# Patient Record
Sex: Female | Born: 1940 | Race: White | Hispanic: No | Smoking: Never smoker
Health system: Southern US, Community
[De-identification: ages and names within clinical notes are randomized; demographics above are authoritative.]

## PROBLEM LIST (undated history)

## (undated) DIAGNOSIS — C50919 Malignant neoplasm of unspecified site of unspecified female breast: Secondary | ICD-10-CM

## (undated) DIAGNOSIS — D179 Benign lipomatous neoplasm, unspecified: Secondary | ICD-10-CM

## (undated) DIAGNOSIS — D751 Secondary polycythemia: Secondary | ICD-10-CM

## (undated) DIAGNOSIS — I10 Essential (primary) hypertension: Secondary | ICD-10-CM

## (undated) HISTORY — DX: Benign lipomatous neoplasm, unspecified: D17.9

## (undated) HISTORY — PX: OTHER SURGICAL HISTORY: SHX169

## (undated) HISTORY — DX: Secondary polycythemia: D75.1

---

## 1997-05-09 DIAGNOSIS — C50919 Malignant neoplasm of unspecified site of unspecified female breast: Secondary | ICD-10-CM

## 1997-05-09 HISTORY — PX: MASTECTOMY: SHX3

## 1997-05-09 HISTORY — DX: Malignant neoplasm of unspecified site of unspecified female breast: C50.919

## 2010-05-25 ENCOUNTER — Ambulatory Visit: Payer: Self-pay | Admitting: Internal Medicine

## 2011-05-31 ENCOUNTER — Ambulatory Visit: Payer: Self-pay | Admitting: Internal Medicine

## 2011-09-20 ENCOUNTER — Ambulatory Visit: Payer: Self-pay | Admitting: Internal Medicine

## 2011-11-01 ENCOUNTER — Inpatient Hospital Stay: Payer: Self-pay | Admitting: Internal Medicine

## 2011-11-01 LAB — TSH: Thyroid Stimulating Horm: 2.52 u[IU]/mL

## 2011-11-01 LAB — URINALYSIS, COMPLETE
Bacteria: NONE SEEN
Glucose,UR: NEGATIVE mg/dL (ref 0–75)
Leukocyte Esterase: NEGATIVE
Nitrite: NEGATIVE
Ph: 7 (ref 4.5–8.0)
Protein: NEGATIVE
Specific Gravity: 1.008 (ref 1.003–1.030)
WBC UR: 1 /HPF (ref 0–5)

## 2011-11-01 LAB — CBC
HGB: 16.6 g/dL — ABNORMAL HIGH (ref 12.0–16.0)
MCH: 31.6 pg (ref 26.0–34.0)
MCHC: 33.5 g/dL (ref 32.0–36.0)
Platelet: 207 10*3/uL (ref 150–440)
RBC: 5.23 10*6/uL — ABNORMAL HIGH (ref 3.80–5.20)
WBC: 6.2 10*3/uL (ref 3.6–11.0)

## 2011-11-01 LAB — COMPREHENSIVE METABOLIC PANEL
Anion Gap: 8 (ref 7–16)
Bilirubin,Total: 0.4 mg/dL (ref 0.2–1.0)
Chloride: 108 mmol/L — ABNORMAL HIGH (ref 98–107)
Creatinine: 0.91 mg/dL (ref 0.60–1.30)
Glucose: 108 mg/dL — ABNORMAL HIGH (ref 65–99)
Osmolality: 285 (ref 275–301)
SGOT(AST): 24 U/L (ref 15–37)
Total Protein: 7.5 g/dL (ref 6.4–8.2)

## 2011-11-01 LAB — TROPONIN I: Troponin-I: <0.02 ng/mL

## 2011-11-02 LAB — BASIC METABOLIC PANEL
Anion Gap: 10 (ref 7–16)
BUN: 29 mg/dL — ABNORMAL HIGH (ref 7–18)
Calcium, Total: 8.8 mg/dL (ref 8.5–10.1)
Chloride: 103 mmol/L (ref 98–107)
Co2: 24 mmol/L (ref 21–32)
Creatinine: 1.66 mg/dL — ABNORMAL HIGH (ref 0.60–1.30)
EGFR (African American): 36 — ABNORMAL LOW
Osmolality: 282 (ref 275–301)
Potassium: 3.8 mmol/L (ref 3.5–5.1)
Sodium: 137 mmol/L (ref 136–145)

## 2011-11-02 LAB — CBC WITH DIFFERENTIAL/PLATELET
Basophil %: 0.2 %
HCT: 46.7 % (ref 35.0–47.0)
HGB: 15.4 g/dL (ref 12.0–16.0)
Lymphocyte %: 7.6 %
MCH: 30.9 pg (ref 26.0–34.0)
Monocyte %: 6.2 %
Platelet: 192 10*3/uL (ref 150–440)
RDW: 12.7 % (ref 11.5–14.5)
WBC: 11.3 10*3/uL — ABNORMAL HIGH (ref 3.6–11.0)

## 2011-11-02 LAB — URINALYSIS, COMPLETE
Bilirubin,UR: NEGATIVE
Blood: NEGATIVE
Hyaline Cast: 4
Leukocyte Esterase: NEGATIVE
Ph: 7 (ref 4.5–8.0)
RBC,UR: 2 /HPF (ref 0–5)
WBC UR: 2 /HPF (ref 0–5)

## 2011-11-03 LAB — BASIC METABOLIC PANEL
Anion Gap: 8 (ref 7–16)
Calcium, Total: 8.7 mg/dL (ref 8.5–10.1)
Chloride: 108 mmol/L — ABNORMAL HIGH (ref 98–107)
Co2: 26 mmol/L (ref 21–32)
Glucose: 91 mg/dL (ref 65–99)
Sodium: 142 mmol/L (ref 136–145)

## 2011-11-03 LAB — CBC WITH DIFFERENTIAL/PLATELET
Eosinophil %: 0.9 %
HGB: 15.7 g/dL (ref 12.0–16.0)
Lymphocyte #: 2.5 10*3/uL (ref 1.0–3.6)
MCHC: 33.5 g/dL (ref 32.0–36.0)
MCV: 96 fL (ref 80–100)
Monocyte #: 0.9 x10 3/mm (ref 0.2–0.9)
Monocyte %: 10.1 %
Neutrophil #: 5.4 10*3/uL (ref 1.4–6.5)
Neutrophil %: 60.9 %
Platelet: 183 10*3/uL (ref 150–440)
RBC: 4.92 10*6/uL (ref 3.80–5.20)
WBC: 8.8 10*3/uL (ref 3.6–11.0)

## 2011-11-14 ENCOUNTER — Ambulatory Visit: Payer: Self-pay | Admitting: Internal Medicine

## 2012-06-05 ENCOUNTER — Ambulatory Visit: Payer: Self-pay | Admitting: Internal Medicine

## 2013-06-10 ENCOUNTER — Ambulatory Visit: Payer: Self-pay | Admitting: Internal Medicine

## 2013-08-22 ENCOUNTER — Ambulatory Visit: Payer: Self-pay | Admitting: Internal Medicine

## 2013-08-23 LAB — CBC CANCER CENTER
Basophil #: 0 x10 3/mm (ref 0.0–0.1)
Basophil %: 0.5 %
EOS ABS: 0 x10 3/mm (ref 0.0–0.7)
Eosinophil %: 0.3 %
HCT: 51.5 % — ABNORMAL HIGH (ref 35.0–47.0)
HGB: 16.7 g/dL — ABNORMAL HIGH (ref 12.0–16.0)
Lymphocyte #: 1.4 x10 3/mm (ref 1.0–3.6)
Lymphocyte %: 16.9 %
MCH: 30.7 pg (ref 26.0–34.0)
MCHC: 32.5 g/dL (ref 32.0–36.0)
MCV: 94 fL (ref 80–100)
MONOS PCT: 5.9 %
Monocyte #: 0.5 x10 3/mm (ref 0.2–0.9)
Neutrophil #: 6.4 x10 3/mm (ref 1.4–6.5)
Neutrophil %: 76.4 %
Platelet: 220 x10 3/mm (ref 150–440)
RBC: 5.46 10*6/uL — ABNORMAL HIGH (ref 3.80–5.20)
RDW: 13.2 % (ref 11.5–14.5)
WBC: 8.4 x10 3/mm (ref 3.6–11.0)

## 2013-08-23 LAB — FERRITIN: Ferritin (ARMC): 199 ng/mL (ref 8–388)

## 2013-08-23 LAB — IRON AND TIBC
IRON: 129 ug/dL (ref 50–170)
Iron Bind.Cap.(Total): 432 ug/dL (ref 250–450)
Iron Saturation: 30 %
Unbound Iron-Bind.Cap.: 303 ug/dL

## 2013-08-28 LAB — CANCER CENTER HEMATOCRIT: HCT: 44.5 % (ref 35.0–47.0)

## 2013-09-04 LAB — CANCER CENTER HEMATOCRIT: HCT: 43.9 % (ref 35.0–47.0)

## 2013-09-06 ENCOUNTER — Ambulatory Visit: Payer: Self-pay | Admitting: Internal Medicine

## 2013-09-18 LAB — CANCER CENTER HEMATOCRIT: HCT: 45.3 % (ref 35.0–47.0)

## 2013-10-02 LAB — CANCER CENTER HEMATOCRIT: HCT: 42.6 % (ref 35.0–47.0)

## 2013-10-07 ENCOUNTER — Ambulatory Visit: Payer: Self-pay | Admitting: Internal Medicine

## 2013-10-16 LAB — CANCER CENTER HEMATOCRIT: HCT: 45.2 % (ref 35.0–47.0)

## 2013-10-30 LAB — CANCER CENTER HEMATOCRIT: HCT: 43.6 % (ref 35.0–47.0)

## 2013-11-06 ENCOUNTER — Ambulatory Visit: Payer: Self-pay | Admitting: Internal Medicine

## 2013-11-13 LAB — CANCER CENTER HEMATOCRIT: HCT: 45.5 % (ref 35.0–47.0)

## 2013-11-27 DIAGNOSIS — D751 Secondary polycythemia: Secondary | ICD-10-CM | POA: Insufficient documentation

## 2013-11-27 LAB — CANCER CENTER HEMATOCRIT: HCT: 43.3 % (ref 35.0–47.0)

## 2013-12-07 ENCOUNTER — Ambulatory Visit: Payer: Self-pay | Admitting: Internal Medicine

## 2013-12-11 ENCOUNTER — Ambulatory Visit: Payer: Self-pay | Admitting: Internal Medicine

## 2013-12-11 LAB — COMPREHENSIVE METABOLIC PANEL
ALBUMIN: 3.3 g/dL — AB (ref 3.4–5.0)
AST: 14 U/L — AB (ref 15–37)
Alkaline Phosphatase: 48 U/L
Anion Gap: 8 (ref 7–16)
BILIRUBIN TOTAL: 0.3 mg/dL (ref 0.2–1.0)
BUN: 20 mg/dL — ABNORMAL HIGH (ref 7–18)
Calcium, Total: 8.6 mg/dL (ref 8.5–10.1)
Chloride: 107 mmol/L (ref 98–107)
Co2: 26 mmol/L (ref 21–32)
Creatinine: 1.08 mg/dL (ref 0.60–1.30)
EGFR (African American): 59 — ABNORMAL LOW
EGFR (Non-African Amer.): 51 — ABNORMAL LOW
GLUCOSE: 161 mg/dL — AB (ref 65–99)
Osmolality: 287 (ref 275–301)
POTASSIUM: 4.3 mmol/L (ref 3.5–5.1)
SGPT (ALT): 24 U/L
Sodium: 141 mmol/L (ref 136–145)
Total Protein: 6.5 g/dL (ref 6.4–8.2)

## 2013-12-11 LAB — CBC WITH DIFFERENTIAL/PLATELET
Basophil #: 0 10*3/uL (ref 0.0–0.1)
Basophil %: 0.8 %
Eosinophil #: 0.1 10*3/uL (ref 0.0–0.7)
Eosinophil %: 1.3 %
HCT: 40.8 % (ref 35.0–47.0)
HGB: 13.3 g/dL (ref 12.0–16.0)
LYMPHS PCT: 25.5 %
Lymphocyte #: 1.5 10*3/uL (ref 1.0–3.6)
MCH: 30.4 pg (ref 26.0–34.0)
MCHC: 32.5 g/dL (ref 32.0–36.0)
MCV: 94 fL (ref 80–100)
Monocyte #: 0.5 x10 3/mm (ref 0.2–0.9)
Monocyte %: 8.4 %
Neutrophil #: 3.9 10*3/uL (ref 1.4–6.5)
Neutrophil %: 64 %
PLATELETS: 223 10*3/uL (ref 150–440)
RBC: 4.36 10*6/uL (ref 3.80–5.20)
RDW: 12.5 % (ref 11.5–14.5)
WBC: 6.1 10*3/uL (ref 3.6–11.0)

## 2013-12-11 LAB — URINALYSIS, COMPLETE
BLOOD: NEGATIVE
Bilirubin,UR: NEGATIVE
GLUCOSE, UR: NEGATIVE mg/dL (ref 0–75)
Ketone: NEGATIVE
Leukocyte Esterase: NEGATIVE
NITRITE: NEGATIVE
PROTEIN: NEGATIVE
Ph: 6 (ref 4.5–8.0)
RBC,UR: NONE SEEN /HPF (ref 0–5)
Specific Gravity: 1.014 (ref 1.003–1.030)
Squamous Epithelial: <1

## 2013-12-11 LAB — LIPID PANEL
CHOLESTEROL: 181 mg/dL (ref 0–200)
HDL: 59 mg/dL (ref 40–60)
LDL CHOLESTEROL, CALC: 65 mg/dL (ref 0–100)
Triglycerides: 286 mg/dL — ABNORMAL HIGH (ref 0–200)
VLDL Cholesterol, Calc: 57 mg/dL — ABNORMAL HIGH (ref 5–40)

## 2013-12-11 LAB — CANCER CENTER HEMATOCRIT: HCT: 40.8 % (ref 35.0–47.0)

## 2013-12-13 LAB — URINE CULTURE

## 2013-12-25 LAB — CBC CANCER CENTER
BASOS ABS: 0.1 x10 3/mm (ref 0.0–0.1)
Basophil %: 0.8 %
EOS ABS: 0.1 x10 3/mm (ref 0.0–0.7)
Eosinophil %: 1.5 %
HCT: 46.4 % (ref 35.0–47.0)
HGB: 14.9 g/dL (ref 12.0–16.0)
Lymphocyte #: 1.7 x10 3/mm (ref 1.0–3.6)
Lymphocyte %: 26.8 %
MCH: 29.8 pg (ref 26.0–34.0)
MCHC: 32.1 g/dL (ref 32.0–36.0)
MCV: 93 fL (ref 80–100)
Monocyte #: 0.7 x10 3/mm (ref 0.2–0.9)
Monocyte %: 10.4 %
NEUTROS PCT: 60.5 %
Neutrophil #: 3.8 x10 3/mm (ref 1.4–6.5)
Platelet: 191 x10 3/mm (ref 150–440)
RBC: 5 10*6/uL (ref 3.80–5.20)
RDW: 12.5 % (ref 11.5–14.5)
WBC: 6.2 x10 3/mm (ref 3.6–11.0)

## 2014-01-07 ENCOUNTER — Ambulatory Visit: Payer: Self-pay | Admitting: Internal Medicine

## 2014-01-15 LAB — CANCER CENTER HEMATOCRIT: HCT: 46.4 % (ref 35.0–47.0)

## 2014-02-06 ENCOUNTER — Ambulatory Visit: Payer: Self-pay | Admitting: Internal Medicine

## 2014-02-12 LAB — CANCER CENTER HEMATOCRIT: HCT: 45 % (ref 35.0–47.0)

## 2014-03-09 ENCOUNTER — Ambulatory Visit: Payer: Self-pay | Admitting: Internal Medicine

## 2014-03-12 LAB — CANCER CENTER HEMATOCRIT: HCT: 42.8 % (ref 35.0–47.0)

## 2014-04-08 ENCOUNTER — Ambulatory Visit: Payer: Self-pay | Admitting: Internal Medicine

## 2014-04-09 LAB — CANCER CENTER HEMATOCRIT: HCT: 46.4 % (ref 35.0–47.0)

## 2014-05-07 LAB — CANCER CENTER HEMATOCRIT: HCT: 46.6 % (ref 35.0–47.0)

## 2014-05-09 ENCOUNTER — Ambulatory Visit: Payer: Self-pay | Admitting: Internal Medicine

## 2014-06-04 ENCOUNTER — Ambulatory Visit: Payer: Self-pay | Admitting: Internal Medicine

## 2014-06-04 LAB — CBC CANCER CENTER
BASOS ABS: 0.1 x10 3/mm (ref 0.0–0.1)
Basophil %: 0.9 %
EOS ABS: 0.1 x10 3/mm (ref 0.0–0.7)
Eosinophil %: 1.5 %
HCT: 44 % (ref 35.0–47.0)
HGB: 14.1 g/dL (ref 12.0–16.0)
Lymphocyte #: 1.4 x10 3/mm (ref 1.0–3.6)
Lymphocyte %: 22.4 %
MCH: 28.4 pg (ref 26.0–34.0)
MCHC: 32 g/dL (ref 32.0–36.0)
MCV: 89 fL (ref 80–100)
MONOS PCT: 12.2 %
Monocyte #: 0.7 x10 3/mm (ref 0.2–0.9)
NEUTROS PCT: 63 %
Neutrophil #: 3.8 x10 3/mm (ref 1.4–6.5)
Platelet: 187 x10 3/mm (ref 150–440)
RBC: 4.97 10*6/uL (ref 3.80–5.20)
RDW: 14.9 % — AB (ref 11.5–14.5)
WBC: 6.1 x10 3/mm (ref 3.6–11.0)

## 2014-06-04 LAB — COMPREHENSIVE METABOLIC PANEL
ALBUMIN: 3.4 g/dL (ref 3.4–5.0)
ALK PHOS: 52 U/L (ref 46–116)
Anion Gap: 8 (ref 7–16)
BILIRUBIN TOTAL: 0.3 mg/dL (ref 0.2–1.0)
BUN: 18 mg/dL (ref 7–18)
CALCIUM: 9 mg/dL (ref 8.5–10.1)
CREATININE: 0.92 mg/dL (ref 0.60–1.30)
Chloride: 108 mmol/L — ABNORMAL HIGH (ref 98–107)
Co2: 26 mmol/L (ref 21–32)
EGFR (African American): >60
EGFR (Non-African Amer.): >60
GLUCOSE: 99 mg/dL (ref 65–99)
Osmolality: 285 (ref 275–301)
Potassium: 4.1 mmol/L (ref 3.5–5.1)
SGOT(AST): 25 U/L (ref 15–37)
SGPT (ALT): 23 U/L (ref 14–63)
SODIUM: 142 mmol/L (ref 136–145)
Total Protein: 6.8 g/dL (ref 6.4–8.2)

## 2014-06-04 LAB — LIPID PANEL
Cholesterol: 180 mg/dL (ref 0–200)
HDL Cholesterol: 68 mg/dL — ABNORMAL HIGH (ref 40–60)
Ldl Cholesterol, Calc: 75 mg/dL (ref 0–100)
TRIGLYCERIDES: 187 mg/dL (ref 0–200)
VLDL Cholesterol, Calc: 37 mg/dL (ref 5–40)

## 2014-06-04 LAB — CBC WITH DIFFERENTIAL/PLATELET
BASOS PCT: 0.5 %
Basophil #: 0 10*3/uL (ref 0.0–0.1)
Eosinophil #: 0.1 10*3/uL (ref 0.0–0.7)
Eosinophil %: 1.3 %
HCT: 44.1 % (ref 35.0–47.0)
HGB: 14.2 g/dL (ref 12.0–16.0)
LYMPHS ABS: 1.4 10*3/uL (ref 1.0–3.6)
Lymphocyte %: 22.4 %
MCH: 28.6 pg (ref 26.0–34.0)
MCHC: 32.2 g/dL (ref 32.0–36.0)
MCV: 89 fL (ref 80–100)
MONO ABS: 0.7 x10 3/mm (ref 0.2–0.9)
Monocyte %: 12.3 %
Neutrophil #: 3.9 10*3/uL (ref 1.4–6.5)
Neutrophil %: 63.5 %
Platelet: 188 10*3/uL (ref 150–440)
RBC: 4.97 10*6/uL (ref 3.80–5.20)
RDW: 15.3 % — ABNORMAL HIGH (ref 11.5–14.5)
WBC: 6.1 10*3/uL (ref 3.6–11.0)

## 2014-06-04 LAB — URINALYSIS, COMPLETE
BACTERIA: NONE SEEN
Bilirubin,UR: NEGATIVE
Blood: NEGATIVE
Glucose,UR: NEGATIVE mg/dL (ref 0–75)
KETONE: NEGATIVE
LEUKOCYTE ESTERASE: NEGATIVE
Nitrite: NEGATIVE
PH: 7 (ref 4.5–8.0)
Protein: NEGATIVE
RBC,UR: 2 /HPF (ref 0–5)
Specific Gravity: 1.008 (ref 1.003–1.030)
WBC UR: <1 /HPF (ref 0–5)

## 2014-06-04 LAB — TSH: THYROID STIMULATING HORM: 0.875 u[IU]/mL

## 2014-06-09 ENCOUNTER — Ambulatory Visit: Payer: Self-pay | Admitting: Internal Medicine

## 2014-06-19 ENCOUNTER — Ambulatory Visit: Payer: Self-pay | Admitting: Internal Medicine

## 2014-07-08 ENCOUNTER — Ambulatory Visit
Admit: 2014-07-08 | Disposition: A | Discharge status: Home / Self Care | Payer: Self-pay | Attending: Internal Medicine | Admitting: Internal Medicine

## 2014-08-01 LAB — CANCER CENTER HEMATOCRIT: HCT: 44.3 % (ref 35.0–47.0)

## 2014-08-08 ENCOUNTER — Ambulatory Visit
Admit: 2014-08-08 | Disposition: A | Discharge status: Home / Self Care | Payer: Self-pay | Attending: Internal Medicine | Admitting: Internal Medicine

## 2014-08-27 LAB — CANCER CENTER HEMATOCRIT: HCT: 42.4 % (ref 35.0–47.0)

## 2014-08-31 NOTE — H&P (Signed)
PATIENT NAME:  Michele Andrade, Michele Andrade MR#:  [id number] DATE OF BIRTH:  1941-04-02  DATE OF ADMISSION:  11/01/2011  PRIMARY MD: Dr. Doy Hutching   ED REFERRING DOCTOR: Dr. Conni Slipper    CHIEF COMPLAINT: Confusion.   HISTORY OF PRESENT ILLNESS: The patient is a 74 year old Caucasian female who apparently has a history of hypertension, was on medication before but is not currently taking any medications. She lives in independent living at [facility], brought into the hospital after she was noted to be confused today. The patient was noted to have a blood pressure that was elevated in the 200's on arrival. The patient kept repeating herself asking the same questions. She has never had this happen before. She is currently a little bit better and knows she is in the hospital but keeps asking the same questions. She otherwise denies any chest pain, shortness of breath. No abdominal pain. Denies any asymmetrical weakness. No numbness. No previous history of CVA, TIA. She does have a lot of stressors ongoing currently.   PAST MEDICAL HISTORY: Remote history of hypertension, not on any treatment.   PAST SURGICAL HISTORY: Status post tonsillectomy.   ALLERGIES: None.   MEDICATIONS: None.   SOCIAL HISTORY: Does not smoke. Does not drink. No drugs.   FAMILY HISTORY: Positive for hypertension.  REVIEW OF SYSTEMS: Unobtainable due to the patient's confusion.   PHYSICAL EXAMINATION:   VITAL SIGNS: Temperature 98.1, pulse 68, respirations 18, blood pressure 177/92 currently.   GENERAL: The patient is a well developed, well nourished female in no acute distress.   HEENT: Head atraumatic, normocephalic. Pupils equally round, reactive to light and accommodation. There is no conjunctival pallor. Oropharynx is clear without any exudate.   NECK: There is no thyromegaly. No carotid bruits.   CARDIOVASCULAR: Regular rate and rhythm. No murmurs, rubs, clicks, or gallops. PMI is not displaced.   LUNGS: Clear  to auscultation bilaterally without any rales, rhonchi, or wheezing.   ABDOMEN: Soft, nontender, nondistended. Positive bowel sounds x4.   EXTREMITIES: No clubbing, cyanosis, or edema.   SKIN: No rash.   LYMPHATICS: No lymph nodes palpable.   VASCULAR: Good DP, PT pulses.   PSYCHIATRIC: Not anxious.   NEUROLOGIC: Cranial nerves II through XII grossly intact. Is alert to place, person, and time but intermittently gets confused. Strength 5/5 in all four extremities. Reflexes 2+. Babinski downgoing.   EVALUATIONS IN THE ED: Urinalysis shows nitrites negative, leukocytes negative. Glucose elevated at 130.   Chest x-ray shows borderline cardiomegaly.   CT scan of the head without contrast shows no acute abnormality.   WBC 6.2, hemoglobin 16.6, platelet count 207, glucose 108, BUN 27, creatinine 0.91, sodium 140, potassium 4.1, chloride 108, CO2 24. LFTs were normal. Troponin less than 0.02.   ASSESSMENT AND PLAN: The patient is a 74 year old white female with history of hypertension not on any treatment brought in with confusion, noted to have accelerated hypertension.  1. Acute encephalopathy likely due to accelerated blood pressure. CT scan of the head is negative at this time. Will go ahead and start her on p.o. atenolol and hydrochlorothiazide as well as clonidine. Will do p.r.n. hydralazine as needed. If the patient's symptoms continue to persist by morning, would get a neuro evaluation as well as MRI of the brain.  2. Mild hyperglycemia. Will check a fasting lipid panel.  3. Miscellaneous. Will hold any anticoagulation due to elevated blood pressure.   TIME SPENT: 35 minutes.    ____________________________ Lafonda Mosses Posey Pronto,  MD shp:drc D: 11/01/2011 22:12:43 ET T: 11/02/2011 08:00:26 ET JOB#: [id number]  cc: Klint Lezcano H. Posey Pronto, MD, <Dictator> Leonie Douglas. Doy Hutching, MD Alric Seton MD ELECTRONICALLY SIGNED 11/03/2011 16:39

## 2014-09-30 ENCOUNTER — Encounter: Payer: Self-pay | Admitting: *Deleted

## 2014-09-30 ENCOUNTER — Other Ambulatory Visit: Payer: Self-pay | Admitting: *Deleted

## 2014-09-30 DIAGNOSIS — D751 Secondary polycythemia: Secondary | ICD-10-CM

## 2014-09-30 HISTORY — DX: Secondary polycythemia: D75.1

## 2014-10-01 ENCOUNTER — Inpatient Hospital Stay: Payer: Medicare Other | Attending: Oncology

## 2014-10-01 ENCOUNTER — Inpatient Hospital Stay: Payer: Medicare Other

## 2014-10-01 VITALS — BP 160/84 | HR 55 | Temp 97.0°F | Resp 18

## 2014-10-01 DIAGNOSIS — D751 Secondary polycythemia: Secondary | ICD-10-CM

## 2014-10-01 LAB — HEMATOCRIT: HCT: 43.7 % (ref 35.0–47.0)

## 2014-10-28 ENCOUNTER — Other Ambulatory Visit: Payer: Self-pay

## 2014-10-29 ENCOUNTER — Inpatient Hospital Stay: Payer: Medicare Other

## 2014-10-29 ENCOUNTER — Inpatient Hospital Stay: Payer: Medicare Other | Attending: Oncology

## 2014-10-29 VITALS — BP 130/81 | HR 61

## 2014-10-29 DIAGNOSIS — D751 Secondary polycythemia: Secondary | ICD-10-CM | POA: Diagnosis not present

## 2014-10-29 LAB — HEMATOCRIT: HEMATOCRIT: 45 % (ref 35.0–47.0)

## 2014-11-26 ENCOUNTER — Inpatient Hospital Stay: Payer: Medicare Other

## 2014-11-26 ENCOUNTER — Inpatient Hospital Stay: Payer: Medicare Other | Attending: Oncology

## 2014-11-26 DIAGNOSIS — D751 Secondary polycythemia: Secondary | ICD-10-CM | POA: Insufficient documentation

## 2014-11-26 LAB — HEMATOCRIT: HEMATOCRIT: 45.1 % (ref 35.0–47.0)

## 2014-12-17 ENCOUNTER — Inpatient Hospital Stay: Payer: Medicare Other

## 2014-12-17 ENCOUNTER — Inpatient Hospital Stay (HOSPITAL_BASED_OUTPATIENT_CLINIC_OR_DEPARTMENT_OTHER): Payer: Medicare Other | Admitting: Internal Medicine

## 2014-12-17 ENCOUNTER — Inpatient Hospital Stay: Payer: Medicare Other | Attending: Internal Medicine

## 2014-12-17 VITALS — BP 148/86 | HR 59 | Temp 97.9°F | Resp 16 | Wt 140.4 lb

## 2014-12-17 DIAGNOSIS — Z79899 Other long term (current) drug therapy: Secondary | ICD-10-CM

## 2014-12-17 DIAGNOSIS — M8580 Other specified disorders of bone density and structure, unspecified site: Secondary | ICD-10-CM | POA: Insufficient documentation

## 2014-12-17 DIAGNOSIS — G43909 Migraine, unspecified, not intractable, without status migrainosus: Secondary | ICD-10-CM | POA: Insufficient documentation

## 2014-12-17 DIAGNOSIS — Z7982 Long term (current) use of aspirin: Secondary | ICD-10-CM | POA: Insufficient documentation

## 2014-12-17 DIAGNOSIS — E785 Hyperlipidemia, unspecified: Secondary | ICD-10-CM | POA: Diagnosis not present

## 2014-12-17 DIAGNOSIS — Z853 Personal history of malignant neoplasm of breast: Secondary | ICD-10-CM

## 2014-12-17 DIAGNOSIS — C50919 Malignant neoplasm of unspecified site of unspecified female breast: Secondary | ICD-10-CM | POA: Insufficient documentation

## 2014-12-17 DIAGNOSIS — D751 Secondary polycythemia: Secondary | ICD-10-CM

## 2014-12-17 DIAGNOSIS — I10 Essential (primary) hypertension: Secondary | ICD-10-CM | POA: Insufficient documentation

## 2014-12-17 DIAGNOSIS — K635 Polyp of colon: Secondary | ICD-10-CM | POA: Insufficient documentation

## 2014-12-17 LAB — CBC WITH DIFFERENTIAL/PLATELET
BASOS ABS: 0 10*3/uL (ref 0–0.1)
Basophils Relative: 1 %
EOS ABS: 0 10*3/uL (ref 0–0.7)
EOS PCT: 1 %
HCT: 43.3 % (ref 35.0–47.0)
Hemoglobin: 14.2 g/dL (ref 12.0–16.0)
LYMPHS PCT: 25 %
Lymphs Abs: 1.8 10*3/uL (ref 1.0–3.6)
MCH: 28.5 pg (ref 26.0–34.0)
MCHC: 32.8 g/dL (ref 32.0–36.0)
MCV: 86.9 fL (ref 80.0–100.0)
Monocytes Absolute: 0.8 10*3/uL (ref 0.2–0.9)
Monocytes Relative: 11 %
Neutro Abs: 4.5 10*3/uL (ref 1.4–6.5)
Neutrophils Relative %: 62 %
PLATELETS: 195 10*3/uL (ref 150–440)
RBC: 4.98 MIL/uL (ref 3.80–5.20)
RDW: 14.9 % — AB (ref 11.5–14.5)
WBC: 7.1 10*3/uL (ref 3.6–11.0)

## 2014-12-29 NOTE — Progress Notes (Signed)
[place]  [phone] [phone]     ID: Michele Andrade OB: 1940/07/12  MR#: [id number]  LGX#:[id number]  Patient Care Team: Idelle Crouch, MD as PCP - General (Internal Medicine)  CHIEF COMPLAINT/DIAGNOSIS:  Erythrocytosis, nonsmoker  -  possibly myeloproliferative disorder like polycythemia vera versus other unclear etiology. Workup done on 08/23/13 showed hemoglobin 16.7, hematocrit 51.5%, WBC 8400, platelets 220, ferritin 199, serum iron 129, iron saturation 30%, serum EPO 5.9, COHb 1.4%, [id number] mutation negative. Started phlebotomy therapy on 08/23/13.    (prior labs on 08/19/13 showed Hb 17.6, hematocrit 52.1%, WBC 6800, unremarkable differential, platelets 193K. On 06/14/13, Hb was 16.5, Hct 49.8, WBC 5600, platelets 198, ferritin 148, LFT unremarkable, Cr 1.0. In July 2014, Hb 16.5, Hct 49.5, ANA negative, ESR 4. In Jan 2014 Hb 15.9, Hct 46.9)      HISTORY OF PRESENT ILLNESS:  Patient returns for continued hematology followup, she was seen 6-7 months ago. In between hematocrit monitored has fluctuated in the range of 42.4-45.1, it is 43.3 today. States that she is doing steady and remains physically active. Denies any new dyspnea, orthopnea, PND. States that she maintains adequate fluid intake. She never smoked herself, has intermittent history of secondhand smoke exposure from her husband smoking cigars up until 2011 but none since then. No headaches or other neurological symptoms. Denies any known history of thromboembolic phenomenon including DVT, PE, TIA, CVA, or heart attack. No new bone pains. No fevers.    REVIEW OF SYSTEMS:   ROS As in HPI above. In addition, no fevers or sweats. No new headaches or focal weakness.  No new dizziness or palpitation. No abdominal pain, constipation, diarrhea, dysuria or hematuria. No new skin rash or bleeding symptoms. No new paresthesias in extremities. No polyuria or polydipsia.   PAST MEDICAL  HISTORY: Reviewed. Past Medical History  Diagnosis Date  . Erythrocytosis 09/30/2014          Hypertension  Hyperlipidemia  Colon polyps  Osteopenia  Visual migraines  Breast cancer status post mastectomy and adjuvant chemotherapy 1999  Right rotator cuff surgery  PAST SURGICAL HISTORY: Reviewed. As above.  FAMILY HISTORY: Reviewed. Remarkable for hypertension. Her mother had breast cancer. Denies hematological disorders including polycythemia vera.  SOCIAL HISTORY: Reviewed. Never smoked herself, has intermittent history of secondhand smoke exposure (husband smoked cigars from 1967 - 2011). Denies alcohol or recreational drug usage.   Current Outpatient Prescriptions  Medication Sig Dispense Refill  . aspirin EC 81 MG tablet Take by mouth.    . Calcium Carbonate-Vitamin D 600-400 MG-UNIT per tablet Take by mouth.    . Coenzyme Q10 (CO Q-10) 100 MG CAPS Take by mouth.    Marland Kitchen lisinopril (PRINIVIL,ZESTRIL) 10 MG tablet Take by mouth.    Marland Kitchen MAGNESIUM GLYCINATE PLUS PO Take by mouth.    . Multiple Vitamin (MULTI-VITAMINS) TABS Take by mouth.    . Omega-3 Fatty Acids (FISH OIL) 1000 MG CAPS Take by mouth.    . propranolol (INDERAL) 80 MG tablet Take by mouth.    . riboflavin (VITAMIN B-2) 100 MG TABS tablet Take 2 tablets by mouth daily.     No current facility-administered medications for this visit.    PHYSICAL EXAM: Filed Vitals:   12/17/14 1419  BP: 148/86  Pulse: 59  Temp: 97.9 F (36.6 C)  Resp: 16     There is no height on file to calculate BMI.      GENERAL: Patient is alert and oriented  and in no acute distress. There is no icterus. HEENT: EOMs intact. No cervical lymphadenopathy. CVS: S1S2, regular LUNGS: Bilaterally clear to auscultation, no rhonchi. ABDOMEN: Soft, nontender. No hepatosplenomegaly clinically.  EXTREMITIES: No pedal edema.   LAB RESULTS:    Component Value Date/Time   NA 142 06/04/2014 1443   K 4.1 06/04/2014 1443   CL 108* 06/04/2014 1443     CO2 26 06/04/2014 1443   GLUCOSE 99 06/04/2014 1443   BUN 18 06/04/2014 1443   CREATININE 0.92 06/04/2014 1443   CALCIUM 9.0 06/04/2014 1443   PROT 6.8 06/04/2014 1443   ALBUMIN 3.4 06/04/2014 1443   AST 25 06/04/2014 1443   ALT 23 06/04/2014 1443   ALKPHOS 52 06/04/2014 1443   BILITOT 0.3 06/04/2014 1443   GFRNONAA >60 06/04/2014 1443   GFRNONAA 51* 12/11/2013 1412   GFRAA >60 06/04/2014 1443   GFRAA 59* 12/11/2013 1412    Lab Results  Component Value Date   WBC 7.1 12/17/2014   NEUTROABS 4.5 12/17/2014   HGB 14.2 12/17/2014   HCT 43.3 12/17/2014   MCV 86.9 12/17/2014   PLT 195 12/17/2014    STUDIES: 08/19/13 - hemoglobin 17.6, hematocrit 52.1%, WBC 6800, unremarkable differential, platelets 193K.  06/14/13 - hemoglobin was 16.5, hematocrit 49.8, WBC 5600, platelets 198, ferritin 148, LFT unremarkable, creatinine 1.0. July 2014 - hemoglobin 16.5, hematocrit 49.5, ANA negative, ESR 4.  Jan 2014 - hemoglobin 15.9, hematocrit 46.9.    ASSESSMENT / PLAN:   1. Persistent Erythrocytosis, nonsmoker. Possibly myeloproliferative disorder like polycythemia vera versus other unclear etiology. Workup done on 08/23/13 showed Hb 16.7, hematocrit 51.5%, WBC 8400, platelets 220, ferritin 199, serum iron 129, iron saturation 30%, serum EPO 5.9, COHb 1.4%, [id number] mutation negative. Started phlebotomy therapy on 08/23/13  -   reviewed labs from today and recent, and d/w patient in detail. She overall continues to do steady without any active complaints, no history of thromboembolic phenomena either. Hematocrit remains under better control, it is 43.3 today, will pursue phlebotomy 300 mL. She was reminded to maintain adequate oral fluid intake. Will continue to monitor Hct at less than frequency of q6 weekly and pursue phlebotomy 300 mL each time is Hct is 43 or higher. Next MD followup at 36 weeks with CBC and make further plan of management. 2. Patient is on aspirin 81 mg daily.  3. In  between visits, she was advised to call or come to ER in case of any new side effects from phlebotomy, new symptoms, or acute sickness. She is agreeable to this plan.        Leia Alf, MD   12/29/2014 1:58 PM

## 2015-01-28 ENCOUNTER — Inpatient Hospital Stay: Payer: Medicare Other | Attending: Internal Medicine

## 2015-01-28 ENCOUNTER — Inpatient Hospital Stay: Payer: Medicare Other

## 2015-01-28 VITALS — BP 134/84 | HR 60 | Resp 20

## 2015-01-28 DIAGNOSIS — D751 Secondary polycythemia: Secondary | ICD-10-CM | POA: Diagnosis not present

## 2015-01-28 DIAGNOSIS — Z853 Personal history of malignant neoplasm of breast: Secondary | ICD-10-CM | POA: Diagnosis not present

## 2015-01-28 LAB — HEMATOCRIT: HEMATOCRIT: 45.5 % (ref 35.0–47.0)

## 2015-03-11 ENCOUNTER — Other Ambulatory Visit: Payer: Medicare Other

## 2015-03-12 ENCOUNTER — Inpatient Hospital Stay: Payer: Medicare Other

## 2015-03-12 ENCOUNTER — Inpatient Hospital Stay: Payer: Medicare Other | Attending: Internal Medicine

## 2015-03-12 VITALS — BP 147/83 | HR 56 | Resp 20

## 2015-03-12 DIAGNOSIS — D751 Secondary polycythemia: Secondary | ICD-10-CM | POA: Insufficient documentation

## 2015-03-12 LAB — HEMATOCRIT: HEMATOCRIT: 47.8 % — AB (ref 35.0–47.0)

## 2015-04-22 ENCOUNTER — Inpatient Hospital Stay: Payer: Medicare Other | Attending: Internal Medicine

## 2015-04-22 ENCOUNTER — Inpatient Hospital Stay: Payer: Medicare Other

## 2015-04-22 VITALS — BP 150/88 | HR 80 | Resp 20

## 2015-04-22 DIAGNOSIS — D751 Secondary polycythemia: Secondary | ICD-10-CM

## 2015-04-22 LAB — HEMATOCRIT: HCT: 46.5 % (ref 35.0–47.0)

## 2015-06-03 ENCOUNTER — Inpatient Hospital Stay: Payer: Medicare Other | Attending: Internal Medicine

## 2015-06-03 ENCOUNTER — Inpatient Hospital Stay: Payer: Medicare Other

## 2015-06-03 VITALS — BP 123/78 | HR 56 | Temp 97.0°F | Resp 20

## 2015-06-03 DIAGNOSIS — D751 Secondary polycythemia: Secondary | ICD-10-CM

## 2015-06-03 LAB — HEMATOCRIT: HCT: 46.8 % (ref 35.0–47.0)

## 2015-06-11 ENCOUNTER — Other Ambulatory Visit: Payer: Self-pay | Admitting: Internal Medicine

## 2015-06-11 DIAGNOSIS — Z1231 Encounter for screening mammogram for malignant neoplasm of breast: Secondary | ICD-10-CM

## 2015-06-25 ENCOUNTER — Other Ambulatory Visit: Payer: Self-pay | Admitting: Internal Medicine

## 2015-06-25 ENCOUNTER — Ambulatory Visit
Admission: RE | Admit: 2015-06-25 | Discharge: 2015-06-25 | Disposition: A | Discharge status: Home / Self Care | Payer: Medicare Other | Source: Ambulatory Visit | Attending: Internal Medicine | Admitting: Internal Medicine

## 2015-06-25 DIAGNOSIS — Z1231 Encounter for screening mammogram for malignant neoplasm of breast: Secondary | ICD-10-CM | POA: Diagnosis not present

## 2015-06-25 DIAGNOSIS — Z9011 Acquired absence of right breast and nipple: Secondary | ICD-10-CM | POA: Insufficient documentation

## 2015-06-25 HISTORY — DX: Malignant neoplasm of unspecified site of unspecified female breast: C50.919

## 2015-07-15 ENCOUNTER — Inpatient Hospital Stay: Payer: Medicare Other | Attending: Internal Medicine

## 2015-07-15 ENCOUNTER — Inpatient Hospital Stay: Payer: Medicare Other

## 2015-07-15 ENCOUNTER — Other Ambulatory Visit: Payer: Self-pay | Admitting: *Deleted

## 2015-07-15 VITALS — BP 124/76 | HR 58 | Temp 98.8°F | Resp 20

## 2015-07-15 DIAGNOSIS — D751 Secondary polycythemia: Secondary | ICD-10-CM | POA: Diagnosis present

## 2015-07-15 LAB — HEMATOCRIT: HEMATOCRIT: 45.3 % (ref 35.0–47.0)

## 2015-08-26 ENCOUNTER — Ambulatory Visit: Payer: Medicare Other | Admitting: Internal Medicine

## 2015-08-26 ENCOUNTER — Other Ambulatory Visit: Payer: Medicare Other

## 2015-08-27 ENCOUNTER — Inpatient Hospital Stay: Payer: Medicare Other

## 2015-08-27 ENCOUNTER — Other Ambulatory Visit: Payer: Self-pay | Admitting: *Deleted

## 2015-08-27 ENCOUNTER — Inpatient Hospital Stay: Payer: Medicare Other | Attending: Internal Medicine | Admitting: Internal Medicine

## 2015-08-27 VITALS — BP 154/80 | HR 50

## 2015-08-27 VITALS — BP 149/83 | HR 59 | Temp 97.5°F | Resp 16 | Wt 142.6 lb

## 2015-08-27 DIAGNOSIS — Z853 Personal history of malignant neoplasm of breast: Secondary | ICD-10-CM | POA: Insufficient documentation

## 2015-08-27 DIAGNOSIS — Z9011 Acquired absence of right breast and nipple: Secondary | ICD-10-CM | POA: Diagnosis not present

## 2015-08-27 DIAGNOSIS — D751 Secondary polycythemia: Secondary | ICD-10-CM

## 2015-08-27 DIAGNOSIS — Z7982 Long term (current) use of aspirin: Secondary | ICD-10-CM | POA: Diagnosis not present

## 2015-08-27 DIAGNOSIS — Z79899 Other long term (current) drug therapy: Secondary | ICD-10-CM | POA: Diagnosis not present

## 2015-08-27 LAB — CBC WITH DIFFERENTIAL/PLATELET
BASOS ABS: 0 10*3/uL (ref 0–0.1)
Basophils Relative: 1 %
Eosinophils Absolute: 0.1 10*3/uL (ref 0–0.7)
Eosinophils Relative: 1 %
HEMATOCRIT: 47.4 % — AB (ref 35.0–47.0)
HEMOGLOBIN: 15.9 g/dL (ref 12.0–16.0)
Lymphocytes Relative: 21 %
Lymphs Abs: 1.3 10*3/uL (ref 1.0–3.6)
MCH: 30.2 pg (ref 26.0–34.0)
MCHC: 33.4 g/dL (ref 32.0–36.0)
MCV: 90.4 fL (ref 80.0–100.0)
MONOS PCT: 7 %
Monocytes Absolute: 0.4 10*3/uL (ref 0.2–0.9)
NEUTROS ABS: 4.4 10*3/uL (ref 1.4–6.5)
NEUTROS PCT: 70 %
Platelets: 184 10*3/uL (ref 150–440)
RBC: 5.25 MIL/uL — AB (ref 3.80–5.20)
RDW: 14.3 % (ref 11.5–14.5)
WBC: 6.2 10*3/uL (ref 3.6–11.0)

## 2015-08-27 LAB — FERRITIN: FERRITIN: 9 ng/mL — AB (ref 11–307)

## 2015-08-27 LAB — IRON AND TIBC
IRON: 141 ug/dL (ref 28–170)
Saturation Ratios: 27 % (ref 10.4–31.8)
TIBC: 515 ug/dL — AB (ref 250–450)
UIBC: 374 ug/dL

## 2015-08-27 NOTE — Progress Notes (Signed)
[place] OFFICE PROGRESS NOTE  Patient Care Team: Idelle Crouch, MD as PCP - General (Internal Medicine)   SUMMARY OF ONCOLOGIC HISTORY:  # April 2015- ERTHROCYTOSIS ? Etiology on Phlebotomy every 6 weeks  # 1999-RIGHT BREAST- Mastec & ALND [[place]] & chemo; Tam x 5 years   INTERVAL HISTORY:  This is my first interaction with the patient since I joined the practice September 2016. I reviewed the patient's prior charts/pertinent labs/imaging in detail; findings are summarized above.   A very pleasant 75 year old female patient with above history of erythrocytosis since  April 2015 is here for follow-up/ Phlebotomy.   Feel any different either before or after phlebotomy. She has no history of any headaches or DVTs or PEs.  She takes aspirin.   REVIEW OF SYSTEMS:  A complete 10 point review of system is done which is negative except mentioned above/history of present illness.   PAST MEDICAL HISTORY :  Past Medical History  Diagnosis Date  . Erythrocytosis 09/30/2014  . Breast cancer ([place]) 1999    right breast ca    PAST SURGICAL HISTORY :   Past Surgical History  Procedure Laterality Date  . Mastectomy Right 1999    FAMILY HISTORY :   Family History  Problem Relation Age of Onset  . Breast cancer Mother 71    SOCIAL HISTORY:   Social History  Substance Use Topics  . Smoking status: Not on file  . Smokeless tobacco: Not on file  . Alcohol Use: Not on file    ALLERGIES:  has No Known Allergies.  MEDICATIONS:  Current Outpatient Prescriptions  Medication Sig Dispense Refill  . aspirin 325 MG tablet Take 325 mg by mouth daily.    . Calcium Carbonate-Vitamin D 600-400 MG-UNIT per tablet Take by mouth.    . Coenzyme Q10 (CO Q-10) 100 MG CAPS Take by mouth.    Marland Kitchen lisinopril (PRINIVIL,ZESTRIL) 10 MG tablet Take by mouth.    Marland Kitchen MAGNESIUM GLYCINATE PLUS PO Take by mouth.    . Multiple Vitamin (MULTI-VITAMINS) TABS Take by mouth.    . Omega-3  Fatty Acids (FISH OIL) 1000 MG CAPS Take by mouth.    . propranolol (INDERAL) 80 MG tablet Take by mouth.    . riboflavin (VITAMIN B-2) 100 MG TABS tablet Take 2 tablets by mouth daily.     No current facility-administered medications for this visit.    PHYSICAL EXAMINATION: ECOG PERFORMANCE STATUS: 0 - Asymptomatic  BP 149/83 mmHg  Pulse 59  Temp(Src) 97.5 F (36.4 C) (Tympanic)  Resp 16  Wt 142 lb 10.2 oz (64.7 kg)  Filed Weights   08/27/15 1414  Weight: 142 lb 10.2 oz (64.7 kg)    GENERAL: Well-nourished well-developed; Alert, no distress and comfortable.  Alone.  EYES: no pallor or icterus OROPHARYNX: no thrush or ulceration; good dentition  NECK: supple, no masses felt LYMPH:  no palpable lymphadenopathy in the cervical, axillary or inguinal regions LUNGS: clear to auscultation and  No wheeze or crackles HEART/CVS: regular rate & rhythm and no murmurs; No lower extremity edema ABDOMEN:abdomen soft, non-tender and normal bowel sounds Musculoskeletal:no cyanosis of digits and no clubbing  PSYCH: alert & oriented x 3 with fluent speech NEURO: no focal motor/sensory deficits SKIN:  no rashes or significant lesions  LABORATORY DATA:  I have reviewed the data as listed    Component Value Date/Time   NA 142 06/04/2014 1443   K 4.1 06/04/2014 1443   CL 108* 06/04/2014  1443   CO2 26 06/04/2014 1443   GLUCOSE 99 06/04/2014 1443   BUN 18 06/04/2014 1443   CREATININE 0.92 06/04/2014 1443   CALCIUM 9.0 06/04/2014 1443   PROT 6.8 06/04/2014 1443   ALBUMIN 3.4 06/04/2014 1443   AST 25 06/04/2014 1443   ALT 23 06/04/2014 1443   ALKPHOS 52 06/04/2014 1443   BILITOT 0.3 06/04/2014 1443   GFRNONAA >60 06/04/2014 1443   GFRNONAA 51* 12/11/2013 1412   GFRAA >60 06/04/2014 1443   GFRAA 59* 12/11/2013 1412    No results found for: SPEP, UPEP  Lab Results  Component Value Date   WBC 6.2 08/27/2015   NEUTROABS 4.4 08/27/2015   HGB 15.9 08/27/2015   HCT 47.4* 08/27/2015    MCV 90.4 08/27/2015   PLT 184 08/27/2015      Chemistry      Component Value Date/Time   NA 142 06/04/2014 1443   K 4.1 06/04/2014 1443   CL 108* 06/04/2014 1443   CO2 26 06/04/2014 1443   BUN 18 06/04/2014 1443   CREATININE 0.92 06/04/2014 1443      Component Value Date/Time   CALCIUM 9.0 06/04/2014 1443   ALKPHOS 52 06/04/2014 1443   AST 25 06/04/2014 1443   ALT 23 06/04/2014 1443   BILITOT 0.3 06/04/2014 1443          ASSESSMENT & PLAN:   # ERYTHROCYTOSIS-  Unclear etiology. Check  Barnabas Lister 2 mutation reflex;  Erythropoietin level. Patient's hemoglobin today is 15.9 hematocrit 47.  Continue phlebotomy to keep the hematocrit  Less than 45.   Discussed with the patient the difference between primary and secondary erythrocytosis.  Above workup  Might help differentiate both.  #  Patient will follow-up with me in approximately 6 weeks/ possible phlebotomy/  H&H;  And review the results of the above workup.     Cammie Sickle, MD 08/27/2015 2:22 PM

## 2015-08-27 NOTE — Progress Notes (Signed)
Patient does not offer any problems today.  

## 2015-09-07 LAB — JAK2  V617F QUAL. WITH REFLEX TO EXON 12

## 2015-09-07 LAB — JAK2 EXON 12 MUTATION ANALYSIS

## 2015-09-07 LAB — ERYTHROPOIETIN: Erythropoietin: 18.4 m[IU]/mL (ref 2.6–18.5)

## 2015-10-08 ENCOUNTER — Inpatient Hospital Stay (HOSPITAL_BASED_OUTPATIENT_CLINIC_OR_DEPARTMENT_OTHER): Payer: Medicare Other | Admitting: Internal Medicine

## 2015-10-08 ENCOUNTER — Inpatient Hospital Stay: Payer: Medicare Other | Attending: Internal Medicine

## 2015-10-08 ENCOUNTER — Inpatient Hospital Stay: Payer: Medicare Other

## 2015-10-08 ENCOUNTER — Encounter: Payer: Self-pay | Admitting: Internal Medicine

## 2015-10-08 VITALS — BP 155/76 | HR 70 | Temp 98.1°F | Resp 20 | Ht 62.0 in | Wt 143.3 lb

## 2015-10-08 DIAGNOSIS — Z79899 Other long term (current) drug therapy: Secondary | ICD-10-CM | POA: Insufficient documentation

## 2015-10-08 DIAGNOSIS — Z9011 Acquired absence of right breast and nipple: Secondary | ICD-10-CM | POA: Insufficient documentation

## 2015-10-08 DIAGNOSIS — Z853 Personal history of malignant neoplasm of breast: Secondary | ICD-10-CM | POA: Insufficient documentation

## 2015-10-08 DIAGNOSIS — Z7982 Long term (current) use of aspirin: Secondary | ICD-10-CM | POA: Diagnosis not present

## 2015-10-08 DIAGNOSIS — D751 Secondary polycythemia: Secondary | ICD-10-CM | POA: Insufficient documentation

## 2015-10-08 LAB — HEMOGLOBIN: HEMOGLOBIN: 15 g/dL (ref 12.0–16.0)

## 2015-10-08 LAB — HEMATOCRIT: HEMATOCRIT: 44.4 % (ref 35.0–47.0)

## 2015-10-08 NOTE — Progress Notes (Signed)
Patient here for follow up no concerns today. 

## 2015-10-08 NOTE — Progress Notes (Signed)
[place] OFFICE PROGRESS NOTE  Patient Care Team: Idelle Crouch, MD as PCP - General (Internal Medicine)   SUMMARY OF ONCOLOGIC HISTORY:  # April 2015- ERTHROCYTOSIS ; JAK-2/Exon- 12NEG; Epo-N; June 2017-  Phlebotomy every 3 months  # 1999-RIGHT BREAST- Mastec & ALND [[place]] & chemo; Tam x 5 years   INTERVAL HISTORY:  A very pleasant 75 year old female patient with above history of erythrocytosis since  April 2015 is here for follow-up/ Phlebotomy.    patient continues to be asymptomatic. Denies any  Headaches or fatigue or thromboembolic events. She is an aspirin once a day.  Patient last phlebotomy was approximately 6 weeks ago.   REVIEW OF SYSTEMS:  A complete 10 point review of system is done which is negative except mentioned above/history of present illness.   PAST MEDICAL HISTORY :  Past Medical History  Diagnosis Date  . Erythrocytosis 09/30/2014  . Breast cancer ([place]) 1999    right breast ca    PAST SURGICAL HISTORY :   Past Surgical History  Procedure Laterality Date  . Mastectomy Right 1999    FAMILY HISTORY :   Family History  Problem Relation Age of Onset  . Breast cancer Mother 36    SOCIAL HISTORY:   Social History  Substance Use Topics  . Smoking status: Not on file  . Smokeless tobacco: Not on file  . Alcohol Use: Not on file    ALLERGIES:  has No Known Allergies.  MEDICATIONS:  Current Outpatient Prescriptions  Medication Sig Dispense Refill  . aspirin 325 MG tablet Take 325 mg by mouth daily.    . Calcium Carbonate-Vitamin D 600-400 MG-UNIT per tablet Take by mouth.    . Coenzyme Q10 (CO Q-10) 100 MG CAPS Take by mouth.    Marland Kitchen lisinopril (PRINIVIL,ZESTRIL) 10 MG tablet Take by mouth.    Marland Kitchen MAGNESIUM GLYCINATE PLUS PO Take by mouth.    . Multiple Vitamin (MULTI-VITAMINS) TABS Take by mouth.    . Omega-3 Fatty Acids (FISH OIL) 1000 MG CAPS Take by mouth.    . propranolol (INDERAL) 80 MG tablet Take by mouth.    .  riboflavin (VITAMIN B-2) 100 MG TABS tablet Take 2 tablets by mouth daily.     No current facility-administered medications for this visit.    PHYSICAL EXAMINATION: ECOG PERFORMANCE STATUS: 0 - Asymptomatic  BP 155/76 mmHg  Pulse 70  Temp(Src) 98.1 F (36.7 C) (Oral)  Ht 5\' 2"  (1.575 m)  Wt 143 lb 4.8 oz (65 kg)  BMI 26.20 kg/m2  Filed Weights   10/08/15 1400  Weight: 143 lb 4.8 oz (65 kg)    GENERAL: Well-nourished well-developed; Alert, no distress and comfortable.  Alone.  EYES: no pallor or icterus OROPHARYNX: no thrush or ulceration; good dentition  NECK: supple, no masses felt LYMPH:  no palpable lymphadenopathy in the cervical, axillary or inguinal regions LUNGS: clear to auscultation and  No wheeze or crackles HEART/CVS: regular rate & rhythm and no murmurs; No lower extremity edema ABDOMEN:abdomen soft, non-tender and normal bowel sounds Musculoskeletal:no cyanosis of digits and no clubbing  PSYCH: alert & oriented x 3 with fluent speech NEURO: no focal motor/sensory deficits SKIN:  no rashes or significant lesions  LABORATORY DATA:  I have reviewed the data as listed    Component Value Date/Time   NA 142 06/04/2014 1443   K 4.1 06/04/2014 1443   CL 108* 06/04/2014 1443   CO2 26 06/04/2014 1443   GLUCOSE 99  06/04/2014 1443   BUN 18 06/04/2014 1443   CREATININE 0.92 06/04/2014 1443   CALCIUM 9.0 06/04/2014 1443   PROT 6.8 06/04/2014 1443   ALBUMIN 3.4 06/04/2014 1443   AST 25 06/04/2014 1443   ALT 23 06/04/2014 1443   ALKPHOS 52 06/04/2014 1443   BILITOT 0.3 06/04/2014 1443   GFRNONAA >60 06/04/2014 1443   GFRNONAA 51* 12/11/2013 1412   GFRAA >60 06/04/2014 1443   GFRAA 59* 12/11/2013 1412    No results found for: SPEP, UPEP  Lab Results  Component Value Date   WBC 6.2 08/27/2015   NEUTROABS 4.4 08/27/2015   HGB 15.0 10/08/2015   HCT 44.4 10/08/2015   MCV 90.4 08/27/2015   PLT 184 08/27/2015      Chemistry      Component Value Date/Time    NA 142 06/04/2014 1443   K 4.1 06/04/2014 1443   CL 108* 06/04/2014 1443   CO2 26 06/04/2014 1443   BUN 18 06/04/2014 1443   CREATININE 0.92 06/04/2014 1443      Component Value Date/Time   CALCIUM 9.0 06/04/2014 1443   ALKPHOS 52 06/04/2014 1443   AST 25 06/04/2014 1443   ALT 23 06/04/2014 1443   BILITOT 0.3 06/04/2014 1443          ASSESSMENT & PLAN:   # ERYTHROCYTOSIS-  Unclear etiology. 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  Patient is asymptomatic Patient's hemoglobin today is 15.0 hematocrit 44.  Continue phlebotomy  Every 3 months if  Hematocrit greater than 47.  #  Patient will follow-up with me in approximately 6 months possible phlebotomy/  H&H- every 3 months   ;Cammie Sickle, MD 10/08/2015 2:16 PM

## 2015-12-17 ENCOUNTER — Other Ambulatory Visit: Payer: Self-pay | Admitting: Nurse Practitioner

## 2015-12-20 ENCOUNTER — Emergency Department: Payer: Medicare Other

## 2015-12-20 ENCOUNTER — Observation Stay
Admission: EM | Admit: 2015-12-20 | Discharge: 2015-12-21 | Disposition: A | Discharge status: Home / Self Care | Payer: Medicare Other | Attending: Specialist | Admitting: Specialist

## 2015-12-20 ENCOUNTER — Encounter: Payer: Self-pay | Admitting: Emergency Medicine

## 2015-12-20 DIAGNOSIS — Z853 Personal history of malignant neoplasm of breast: Secondary | ICD-10-CM | POA: Diagnosis not present

## 2015-12-20 DIAGNOSIS — I160 Hypertensive urgency: Secondary | ICD-10-CM | POA: Diagnosis present

## 2015-12-20 DIAGNOSIS — I10 Essential (primary) hypertension: Principal | ICD-10-CM | POA: Insufficient documentation

## 2015-12-20 DIAGNOSIS — D751 Secondary polycythemia: Secondary | ICD-10-CM | POA: Diagnosis not present

## 2015-12-20 DIAGNOSIS — Z7982 Long term (current) use of aspirin: Secondary | ICD-10-CM | POA: Diagnosis not present

## 2015-12-20 DIAGNOSIS — Z79899 Other long term (current) drug therapy: Secondary | ICD-10-CM | POA: Insufficient documentation

## 2015-12-20 DIAGNOSIS — Z9889 Other specified postprocedural states: Secondary | ICD-10-CM | POA: Insufficient documentation

## 2015-12-20 DIAGNOSIS — Z803 Family history of malignant neoplasm of breast: Secondary | ICD-10-CM | POA: Insufficient documentation

## 2015-12-20 HISTORY — DX: Essential (primary) hypertension: I10

## 2015-12-20 LAB — URINALYSIS COMPLETE WITH MICROSCOPIC (ARMC ONLY)
BACTERIA UA: NONE SEEN
BILIRUBIN URINE: NEGATIVE
GLUCOSE, UA: NEGATIVE mg/dL
NITRITE: NEGATIVE
Protein, ur: 30 mg/dL — AB
Specific Gravity, Urine: 1.008 (ref 1.005–1.030)
pH: 8 (ref 5.0–8.0)

## 2015-12-20 LAB — COMPREHENSIVE METABOLIC PANEL
ALK PHOS: 51 U/L (ref 38–126)
ALT: 19 U/L (ref 14–54)
AST: 28 U/L (ref 15–41)
Albumin: 4.1 g/dL (ref 3.5–5.0)
Anion gap: 8 (ref 5–15)
BUN: 20 mg/dL (ref 6–20)
CALCIUM: 9.5 mg/dL (ref 8.9–10.3)
CO2: 27 mmol/L (ref 22–32)
CREATININE: 0.88 mg/dL (ref 0.44–1.00)
Chloride: 105 mmol/L (ref 101–111)
Glucose, Bld: 107 mg/dL — ABNORMAL HIGH (ref 65–99)
Potassium: 4.6 mmol/L (ref 3.5–5.1)
Sodium: 140 mmol/L (ref 135–145)
TOTAL PROTEIN: 6.8 g/dL (ref 6.5–8.1)
Total Bilirubin: 0.8 mg/dL (ref 0.3–1.2)

## 2015-12-20 LAB — CBC WITH DIFFERENTIAL/PLATELET
Basophils Absolute: 0.1 10*3/uL (ref 0–0.1)
Basophils Relative: 1 %
Eosinophils Absolute: 0.1 10*3/uL (ref 0–0.7)
Eosinophils Relative: 1 %
HCT: 53.2 % — ABNORMAL HIGH (ref 35.0–47.0)
HEMOGLOBIN: 17.9 g/dL — AB (ref 12.0–16.0)
LYMPHS ABS: 1.3 10*3/uL (ref 1.0–3.6)
LYMPHS PCT: 22 %
MCH: 30.9 pg (ref 26.0–34.0)
MCHC: 33.7 g/dL (ref 32.0–36.0)
MCV: 91.8 fL (ref 80.0–100.0)
Monocytes Absolute: 0.6 10*3/uL (ref 0.2–0.9)
Monocytes Relative: 9 %
NEUTROS PCT: 67 %
Neutro Abs: 4 10*3/uL (ref 1.4–6.5)
Platelets: 173 10*3/uL (ref 150–440)
RBC: 5.8 MIL/uL — AB (ref 3.80–5.20)
RDW: 14.5 % (ref 11.5–14.5)
WBC: 6.1 10*3/uL (ref 3.6–11.0)

## 2015-12-20 LAB — TROPONIN I

## 2015-12-20 MED ORDER — CO Q-10 100 MG PO CAPS: 1.0000 | ORAL_CAPSULE | Freq: Every day | ORAL | Status: DC

## 2015-12-20 MED ORDER — PROMETHAZINE HCL 25 MG/ML IJ SOLN
12.5000 mg | Freq: Once | INTRAMUSCULAR | Status: AC
  Administered 2015-12-20: 12.5 mg via INTRAVENOUS
  Filled 2015-12-20: qty 1

## 2015-12-20 MED ORDER — HYDRALAZINE HCL 20 MG/ML IJ SOLN
INTRAMUSCULAR | Status: AC
  Administered 2015-12-20: 10 mg via INTRAVENOUS
  Filled 2015-12-20: qty 1

## 2015-12-20 MED ORDER — ACETAMINOPHEN 500 MG PO TABS
1000.0000 mg | ORAL_TABLET | Freq: Once | ORAL | Status: AC
  Administered 2015-12-20: 1000 mg via ORAL
  Filled 2015-12-20: qty 2

## 2015-12-20 MED ORDER — ACETAMINOPHEN 650 MG RE SUPP: 650.0000 mg | Freq: Four times a day (QID) | RECTAL | Status: DC | PRN

## 2015-12-20 MED ORDER — ADULT MULTIVITAMIN W/MINERALS CH
1.0000 | ORAL_TABLET | Freq: Every day | ORAL | Status: DC
  Administered 2015-12-21: 1 via ORAL
  Filled 2015-12-20: qty 1

## 2015-12-20 MED ORDER — LISINOPRIL 20 MG PO TABS
20.0000 mg | ORAL_TABLET | Freq: Every day | ORAL | Status: DC
  Administered 2015-12-21: 20 mg via ORAL
  Filled 2015-12-20: qty 1

## 2015-12-20 MED ORDER — KETOROLAC TROMETHAMINE 60 MG/2ML IM SOLN
INTRAMUSCULAR | Status: AC
  Filled 2015-12-20: qty 2

## 2015-12-20 MED ORDER — KETOROLAC TROMETHAMINE 30 MG/ML IJ SOLN
15.0000 mg | Freq: Once | INTRAMUSCULAR | Status: AC
  Administered 2015-12-20: 15 mg via INTRAVENOUS

## 2015-12-20 MED ORDER — OMEGA-3-ACID ETHYL ESTERS 1 G PO CAPS
1.0000 g | ORAL_CAPSULE | Freq: Every day | ORAL | Status: DC
  Administered 2015-12-21: 1 g via ORAL
  Filled 2015-12-20: qty 1

## 2015-12-20 MED ORDER — VITAMIN B-2 100 MG PO TABS: 200.0000 mg | ORAL_TABLET | Freq: Every day | ORAL | Status: DC

## 2015-12-20 MED ORDER — ASPIRIN 325 MG PO TABS
325.0000 mg | ORAL_TABLET | Freq: Every day | ORAL | Status: DC
  Administered 2015-12-21: 325 mg via ORAL
  Filled 2015-12-20: qty 1

## 2015-12-20 MED ORDER — ONDANSETRON HCL 4 MG PO TABS: 4.0000 mg | ORAL_TABLET | Freq: Four times a day (QID) | ORAL | Status: DC | PRN

## 2015-12-20 MED ORDER — PROPRANOLOL HCL 80 MG PO TABS
80.0000 mg | ORAL_TABLET | Freq: Two times a day (BID) | ORAL | Status: DC
  Filled 2015-12-20: qty 1

## 2015-12-20 MED ORDER — ACETAMINOPHEN 325 MG PO TABS: 650.0000 mg | ORAL_TABLET | Freq: Four times a day (QID) | ORAL | Status: DC | PRN

## 2015-12-20 MED ORDER — ONDANSETRON HCL 4 MG/2ML IJ SOLN: 4.0000 mg | Freq: Four times a day (QID) | INTRAMUSCULAR | Status: DC | PRN

## 2015-12-20 MED ORDER — CLONIDINE HCL 0.1 MG PO TABS
0.1000 mg | ORAL_TABLET | Freq: Once | ORAL | Status: AC
  Administered 2015-12-20: 0.1 mg via ORAL
  Filled 2015-12-20: qty 1

## 2015-12-20 MED ORDER — HYDRALAZINE HCL 20 MG/ML IJ SOLN
10.0000 mg | Freq: Once | INTRAMUSCULAR | Status: AC
  Administered 2015-12-20: 10 mg via INTRAVENOUS
  Filled 2015-12-20: qty 1

## 2015-12-20 MED ORDER — ENOXAPARIN SODIUM 40 MG/0.4ML ~~LOC~~ SOLN
40.0000 mg | SUBCUTANEOUS | Status: DC
  Administered 2015-12-20: 40 mg via SUBCUTANEOUS
  Filled 2015-12-20: qty 0.4

## 2015-12-20 MED ORDER — SODIUM CHLORIDE 0.9% FLUSH
3.0000 mL | Freq: Two times a day (BID) | INTRAVENOUS | Status: DC
  Administered 2015-12-20 – 2015-12-21 (×2): 3 mL via INTRAVENOUS

## 2015-12-20 MED ORDER — HYDRALAZINE HCL 20 MG/ML IJ SOLN
10.0000 mg | Freq: Once | INTRAMUSCULAR | Status: AC
  Administered 2015-12-20: 10 mg via INTRAVENOUS

## 2015-12-20 MED ORDER — SODIUM CHLORIDE 0.9 % IV BOLUS (SEPSIS)
500.0000 mL | Freq: Once | INTRAVENOUS | Status: AC
  Administered 2015-12-20: 500 mL via INTRAVENOUS

## 2015-12-20 MED ORDER — CALCIUM CARBONATE-VITAMIN D 500-200 MG-UNIT PO TABS
1.0000 | ORAL_TABLET | Freq: Every day | ORAL | Status: DC
  Administered 2015-12-21: 1 via ORAL
  Filled 2015-12-20: qty 1

## 2015-12-20 MED ORDER — PROPRANOLOL HCL ER 80 MG PO CP24
80.0000 mg | ORAL_CAPSULE | Freq: Every day | ORAL | Status: DC
  Administered 2015-12-21: 80 mg via ORAL
  Filled 2015-12-20: qty 1

## 2015-12-20 MED ORDER — HYDRALAZINE HCL 20 MG/ML IJ SOLN: 10.0000 mg | Freq: Four times a day (QID) | INTRAMUSCULAR | Status: DC | PRN

## 2015-12-20 NOTE — H&P (Signed)
[facility] [place] NAME: Michele Andrade    MR#:  [id number]  DATE OF BIRTH:  December 05, 1940  DATE OF ADMISSION:  12/20/2015  PRIMARY CARE PHYSICIAN: Idelle Crouch, MD   REQUESTING/REFERRING PHYSICIAN:  Dr. Loura Pardon  CHIEF COMPLAINT:   Chief Complaint  Patient presents with  . Hypertension    HISTORY OF PRESENT ILLNESS:  Michele Andrade  is a 75 y.o. female with a known history of Hypertension, history of breast cancer, polycythemia with phlebotomies presents to hospital secondary to worsening headache. Blood pressure was noted to be systolic greater than [id number] here in the emergency room. Patient is on 2 blood pressure medications at home and states that her blood pressure has never been this high. Her last PCP visit about 4 weeks ago had blood pressure systolic in the [id number] for which she was watching at home. Also her last hematology visit 2 months ago noted that she will need phlebotomies if her hematocrit is greater than 47. Today her hematocrit is at 53. She does not appear to be dehydrated. She complains of worsening headache with her blood pressure being high. CT of the head with no acute abnormalities.  PAST MEDICAL HISTORY:   Past Medical History:  Diagnosis Date  . Breast cancer ([place]) 1999   right breast ca  . Erythrocytosis 09/30/2014  . Hypertension     PAST SURGICAL HISTORY:   Past Surgical History:  Procedure Laterality Date  . MASTECTOMY Right 1999  . rotator cuff      SOCIAL HISTORY:   Social History  Substance Use Topics  . Smoking status: Never Smoker  . Smokeless tobacco: Never Used  . Alcohol use 0.6 oz/week    1 Glasses of wine per week    FAMILY HISTORY:   Family History  Problem Relation Age of Onset  . Breast cancer Mother 56    DRUG ALLERGIES:  No Known Allergies  REVIEW OF SYSTEMS:   Review of Systems  Constitutional: Negative for chills, fever, malaise/fatigue and weight loss.  HENT:  Negative for ear discharge, ear pain, hearing loss, nosebleeds and tinnitus.   Eyes: Negative for blurred vision, double vision and photophobia.  Respiratory: Negative for cough, hemoptysis, shortness of breath and wheezing.   Cardiovascular: Negative for chest pain, palpitations, orthopnea and leg swelling.  Gastrointestinal: Positive for diarrhea. Negative for abdominal pain, constipation, heartburn, melena, nausea and vomiting.  Genitourinary: Negative for dysuria, frequency, hematuria and urgency.  Musculoskeletal: Negative for back pain, myalgias and neck pain.  Skin: Negative for rash.  Neurological: Positive for headaches. Negative for dizziness, tingling, tremors, sensory change, speech change and focal weakness.  Endo/Heme/Allergies: Does not bruise/bleed easily.  Psychiatric/Behavioral: Negative for depression.    MEDICATIONS AT HOME:   Prior to Admission medications   Medication Sig Start Date End Date Taking? Authorizing Provider  aspirin 325 MG tablet Take 325 mg by mouth daily.    Historical Provider, MD  Calcium Carbonate-Vitamin D 600-400 MG-UNIT per tablet Take by mouth.    Historical Provider, MD  Coenzyme Q10 (CO Q-10) 100 MG CAPS Take by mouth.    Historical Provider, MD  lisinopril (PRINIVIL,ZESTRIL) 10 MG tablet Take by mouth. 06/03/14   Historical Provider, MD  MAGNESIUM GLYCINATE PLUS PO Take by mouth.    Historical Provider, MD  Multiple Vitamin (MULTI-VITAMINS) TABS Take by mouth.    Historical Provider, MD  Omega-3 Fatty Acids (FISH OIL) 1000 MG CAPS Take by mouth.  Historical Provider, MD  propranolol (INDERAL) 80 MG tablet Take by mouth. 06/03/14   Historical Provider, MD  riboflavin (VITAMIN B-2) 100 MG TABS tablet Take 2 tablets by mouth daily.    Historical Provider, MD      VITAL SIGNS:  Blood pressure (!) 186/93, pulse 70, resp. rate 16, SpO2 98 %.  PHYSICAL EXAMINATION:   Physical Exam  GENERAL:  76 y.o.-year-old patient sitting in the bed with  no acute distress.  EYES: Pupils equal, round, reactive to light and accommodation. No scleral icterus. Conjunctival erythema noted. Extraocular muscles intact.  HEENT: Head atraumatic, normocephalic. Oropharynx and nasopharynx clear. Facial flushing noted. NECK:  Supple, no jugular venous distention. No thyroid enlargement, no tenderness.  LUNGS: Normal breath sounds bilaterally, no wheezing, rales,rhonchi or crepitation. No use of accessory muscles of respiration.  CARDIOVASCULAR: S1, S2 normal. No murmurs, rubs, or gallops.  ABDOMEN: Soft, nontender, nondistended. Bowel sounds present. No organomegaly or mass.  EXTREMITIES: No pedal edema, cyanosis, or clubbing.  NEUROLOGIC: Cranial nerves II through XII are intact. Muscle strength 5/5 in all extremities. Sensation intact. Gait not checked.  PSYCHIATRIC: The patient is alert and oriented x 3.  SKIN: No obvious rash, lesion, or ulcer.   LABORATORY PANEL:   CBC  Recent Labs Lab 12/20/15 1757  WBC 6.1  HGB 17.9*  HCT 53.2*  PLT 173   ------------------------------------------------------------------------------------------------------------------  Chemistries   Recent Labs Lab 12/20/15 1757  NA 140  K 4.6  CL 105  CO2 27  GLUCOSE 107*  BUN 20  CREATININE 0.88  CALCIUM 9.5  AST 28  ALT 19  ALKPHOS 51  BILITOT 0.8   ------------------------------------------------------------------------------------------------------------------  Cardiac Enzymes  Recent Labs Lab 12/20/15 1757  TROPONINI <0.03   ------------------------------------------------------------------------------------------------------------------  RADIOLOGY:  Ct Head Wo Contrast  Result Date: 12/20/2015 CLINICAL DATA:  Hypertension.  History of breast cancer.  Headache. EXAM: CT HEAD WITHOUT CONTRAST TECHNIQUE: Contiguous axial images were obtained from the base of the skull through the vertex without intravenous contrast. COMPARISON:  11/01/2011 and  11/14/2011 FINDINGS: The brainstem, cerebellum, cerebral peduncles, thalami, basal ganglia, basilar cisterns, and ventricular system appear within normal limits. No intracranial hemorrhage, mass lesion, or acute CVA. Visualized paranasal sinuses appear clear. IMPRESSION: 1.  No significant abnormality identified. Electronically Signed   By: Van Clines M.D.   On: 12/20/2015 18:21   Dg Chest Portable 1 View  Result Date: 12/20/2015 CLINICAL DATA:  Hypertension EXAM: PORTABLE CHEST 1 VIEW COMPARISON:  11/01/2011 FINDINGS: The heart size and mediastinal contours are within normal limits. Both lungs are clear. The visualized skeletal structures are unremarkable. IMPRESSION: No active disease. Electronically Signed   By: Inez Catalina M.D.   On: 12/20/2015 18:29    EKG:   Orders placed or performed during the hospital encounter of 12/20/15  . ED EKG  . ED EKG    IMPRESSION AND PLAN:   Michele Andrade  is a 75 y.o. female with a known history of Hypertension, history of breast cancer, polycythemia with phlebotomies presents to hospital secondary to worsening headache.  #1 Hypertensive urgency- IV hydralazine pushes prn, if doesn't improve- will start on a drip - admit for obs - adjust outpatient meds if needed - CT of the head with no acute abnormality.  #2 polycythemia with erythrocytosis-discussed with hematology on call. -since Hct greater than 53, ordered phlebotomy- 323ml for now and recheck - hematology consult - continue aspirin  #3 H/o Breast cancer- stable,  #4 DVT Prophylaxis- loevnox  All the records are reviewed and case discussed with ED provider. Management plans discussed with the patient, family and they are in agreement.  CODE STATUS: Full code  TOTAL TIME TAKING CARE OF THIS PATIENT: 50 minutes.    Gladstone Lighter M.D on 12/20/2015 at 8:20 PM  Between 7am to 6pm - Pager - [phone]  After 6pm go to [email or web address] - password [email or web address]  Office  [phone]  CC: Primary care physician; Idelle Crouch, MD

## 2015-12-20 NOTE — ED Notes (Signed)
Report given to floor RN. Pt taken to floor via stretcher. Vital signs stable prior to transport.  

## 2015-12-20 NOTE — ED Notes (Signed)
Patient being transported to CT

## 2015-12-20 NOTE — Progress Notes (Addendum)
PHARMACIST - PHYSICIAN ORDER COMMUNICATION  CONCERNING: P&T Medication Policy on Herbal Medications  DESCRIPTION:  This patient's orders for:  Co-Q10, riboflavin  has been noted.  This product(s) is classified as an "herbal" or natural product. Due to a lack of definitive safety studies or FDA approval, nonstandard manufacturing practices, plus the potential risk of unknown drug-drug interactions while on inpatient medications, the Pharmacy and Therapeutics Committee does not permit the use of "herbal" or natural products of this type within [facility].   ACTION TAKEN: The pharmacy department is unable to verify this order at this time. Please reevaluate patient's clinical condition at discharge and address if the herbal or natural product(s) should be resumed at that time.

## 2015-12-20 NOTE — ED Triage Notes (Signed)
Patient brought in by [facility] from [place] [facility] for high blood pressure. Patient is currently on blood pressure medication, patient states that she has had spike in her blood pressure in the past and was advised by her PCP to take an extra 10 mg of Lisinopril. Patient took that at 4:40pm.

## 2015-12-20 NOTE — ED Notes (Signed)
Spoke with Dr. Vianne Bulls regarding pt's BP and seeing if pt could go to floor with current BP of 184/97. Per Dr. Vianne Bulls, pt's systolic BP must [id number] or below to the floor. This RN has given the ordered dose of clonidine, per Dr. Vianne Bulls, if her BP does not come down with clonidine, pt will have to be started on drip and admitted to the unit.

## 2015-12-20 NOTE — ED Notes (Signed)
Pt calling out stating, "The pressure in my head has significantly increased, it was about a 7/10 when I came in, not it's about a 10." Pt denies any blurred vision, nausea, chest pain, dizziness, SOB. BP same as it was prior to pressure in head increasing. MD notified.

## 2015-12-20 NOTE — ED Provider Notes (Signed)
[facility] Emergency Department Provider Note   ____________________________________________   First MD Initiated Contact with Patient 12/20/15 1749     (approximate)  I have reviewed the triage vital signs and the nursing notes.   HISTORY  Chief Complaint Hypertension    HPI Michele Andrade is a 75 y.o. female with history of hypertension, migraines, erythrocytosis requiring periodic phlebotomy who presents for evaluation of hypertension and headache as well as lightheadedness today gradual onset this afternoon at 4:30 pm, currently moderate, no modifying factors. Patient reports that she was resting comfortably at home this afternoon watching TV when she began feeling lightheaded, she denies any room spinning dizziness. She called the nurse at the living facility who checked her blood pressure and it was noted to be elevated, she took an extra dose of lisinopril and EMS was called. On arrival, her blood pressure was elevated at approximately 200/100, the remainder of her vital signs are stable she is afebrile. She denies any chest pain, difficulty breathing, numbness or weakness in the arms or legs or speech difficulty. She denies feeling confused. She denies any recent illness including no vomiting, diarrhea, fevers or chills, no dysuria. She has been compliant with her home doses of lisinopril and propranolol.   Past Medical History:  Diagnosis Date  . Breast cancer ([place]) 1999   right breast ca  . Erythrocytosis 09/30/2014  . Hypertension     Patient Active Problem List   Diagnosis Date Noted  . Breast CA ([place]) 12/17/2014  . Colon polyp 12/17/2014  . HLD (hyperlipidemia) 12/17/2014  . BP (high blood pressure) 12/17/2014  . Headache, migraine 12/17/2014  . Osteopenia 12/17/2014  . Erythrocytosis 09/30/2014  . Polycythemia 11/27/2013    Past Surgical History:  Procedure Laterality Date  . MASTECTOMY Right 1999  . rotator cuff      Prior to  Admission medications   Medication Sig Start Date End Date Taking? Authorizing Provider  aspirin 325 MG tablet Take 325 mg by mouth daily.    Historical Provider, MD  Calcium Carbonate-Vitamin D 600-400 MG-UNIT per tablet Take by mouth.    Historical Provider, MD  Coenzyme Q10 (CO Q-10) 100 MG CAPS Take by mouth.    Historical Provider, MD  lisinopril (PRINIVIL,ZESTRIL) 10 MG tablet Take by mouth. 06/03/14   Historical Provider, MD  MAGNESIUM GLYCINATE PLUS PO Take by mouth.    Historical Provider, MD  Multiple Vitamin (MULTI-VITAMINS) TABS Take by mouth.    Historical Provider, MD  Omega-3 Fatty Acids (FISH OIL) 1000 MG CAPS Take by mouth.    Historical Provider, MD  propranolol (INDERAL) 80 MG tablet Take by mouth. 06/03/14   Historical Provider, MD  riboflavin (VITAMIN B-2) 100 MG TABS tablet Take 2 tablets by mouth daily.    Historical Provider, MD    Allergies Review of patient's allergies indicates no known allergies.  Family History  Problem Relation Age of Onset  . Breast cancer Mother 42    Social History Social History  Substance Use Topics  . Smoking status: Never Smoker  . Smokeless tobacco: Never Used  . Alcohol use 0.6 oz/week    1 Glasses of wine per week    Review of Systems Constitutional: No fever/chills Eyes: No visual changes. ENT: No sore throat. Cardiovascular: Denies chest pain. Respiratory: Denies shortness of breath. Gastrointestinal: No abdominal pain.  No nausea, no vomiting.  No diarrhea.  No constipation. Genitourinary: Negative for dysuria. Musculoskeletal: Negative for back pain. Skin: Negative for  rash. Neurological: Positive for headache, no focal weakness or numbness.  10-point ROS otherwise negative.  ____________________________________________   PHYSICAL EXAM:  Vitals:   12/20/15 1833 12/20/15 1850 12/20/15 1900 12/20/15 1908  BP: (!) 201/97 (!) 173/87 (!) 183/97 (!) 186/93  Pulse: 65 71 73 70  Resp: 17 14 13 16   SpO2: 98% 99%  98% 98%    VITAL SIGNS: ED Triage Vitals  Enc Vitals Group     BP 12/20/15 1744 (!) 229/117     Pulse Rate 12/20/15 1744 63     Resp 12/20/15 1744 18     Temp --      Temp src --      SpO2 12/20/15 1744 100 %     Weight --      Height --      Head Circumference --      Peak Flow --      Pain Score 12/20/15 1746 8     Pain Loc --      Pain Edu? --      Excl. in [place]? --     Constitutional: Alert and oriented. Well appearing and in no acute distress. Eyes: Conjunctivae are normal. PERRL. EOMI. Head: Atraumatic. Nose: No congestion/rhinnorhea. Mouth/Throat: Mucous membranes are moist.  Oropharynx non-erythematous. Neck: No stridor.  Supple without meningismus. Cardiovascular: Normal rate, regular rhythm. Grossly normal heart sounds.  Good peripheral circulation. Respiratory: Normal respiratory effort.  No retractions. Lungs CTAB. Gastrointestinal: Soft and nontender. No distention.  No CVA tenderness. Genitourinary: deferred Musculoskeletal: No lower extremity tenderness nor edema.  No joint effusions. Neurologic:  Normal speech and language. No gross focal neurologic deficits are appreciated. No gait instability. 5 out of 5 strength in bilateral upper and lower extremity, sensation intact to light touch throughout, cranial nerves II through XII intact, normal finger nose finger, normal ambulation. Skin:  Skin is warm, dry and intact. No rash noted. Psychiatric: Mood and affect are normal. Speech and behavior are normal.  ____________________________________________   LABS (all labs ordered are listed, but only abnormal results are displayed)  Labs Reviewed  CBC WITH DIFFERENTIAL/PLATELET - Abnormal; Notable for the following:       Result Value   RBC 5.80 (*)    Hemoglobin 17.9 (*)    HCT 53.2 (*)    All other components within normal limits  COMPREHENSIVE METABOLIC PANEL - Abnormal; Notable for the following:    Glucose, Bld 107 (*)    All other components within  normal limits  URINALYSIS COMPLETEWITH MICROSCOPIC ([facility] ONLY) - Abnormal; Notable for the following:    Color, Urine STRAW (*)    APPearance CLEAR (*)    Ketones, ur TRACE (*)    Hgb urine dipstick 1+ (*)    Protein, ur 30 (*)    Leukocytes, UA TRACE (*)    Squamous Epithelial / LPF 0-5 (*)    All other components within normal limits  TROPONIN I   ____________________________________________  EKG  ED ECG REPORT I, Joanne Gavel, the attending physician, personally viewed and interpreted this ECG.   Date: 12/20/2015  EKG Time: 17:52  Rate: 56  Rhythm: sinus bradycardia  Axis: normal  Intervals:nonspecific intraventricular conduction delay, borderline  ST&T Change: No acute ST elevation or ST depression.  ____________________________________________  RADIOLOGY  CT head IMPRESSION: 1. No significant abnormality identified.  CXR IMPRESSION: No active disease. ____________________________________________   PROCEDURES  Procedure(s) performed: None  Procedures  Critical Care performed: No  ____________________________________________   INITIAL  IMPRESSION / ASSESSMENT AND PLAN / ED COURSE  Pertinent labs & imaging results that were available during my care of the patient were reviewed by me and considered in my medical decision making (see chart for details).  Michele Andrade is a 75 y.o. female with history of hypertension, migraines, erythrocytosis requiring periodic phlebotomy who presents for evaluation of hypertension and headache as well as lightheadedness today gradual onset this afternoon. On exam, she is well-appearing and in no acute distress per she is severely hypertensive at 229/117 and her symptoms are concerning for hypertensive urgency versus emergency. She currently has an intact neurological examination. The remainder of her vital signs are stable and she is afebrile. We'll obtain CT head, screening labs, chest x-ray, gradually lower her blood  pressure and reassess for disposition.  ----------------------------------------- 7:36 PM on 12/20/2015 ----------------------------------------- Blood pressure improved,  now 186 mmHg at 20 mg IV hydralazine however patient with persistent headache. CT head shows no acute intracranial process, obtained within 6 hours of headache onset making subarachnoid hemorrhage less likely. Labs reviewed, hemoglobin is mildly elevated at 17.9, unremarkable CMP, negative troponin. Urinalysis not consist with infection. Chest x-ray clear. EKG with no evidence of acute ischemia. We'll continue to treat her symptomatically for headache. I discussed case with the hospice for admission at this time  Clinical Course     ____________________________________________   FINAL CLINICAL IMPRESSION(S) / ED DIAGNOSES  Final diagnoses:  Hypertensive urgency  Malignant hypertension      NEW MEDICATIONS STARTED DURING THIS VISIT:  New Prescriptions   No medications on file     Note:  This document was prepared using Dragon voice recognition software and may include unintentional dictation errors.    Joanne Gavel, MD 12/20/15 [phone]

## 2015-12-20 NOTE — Care Management Obs Status (Signed)
[facility] NOTIFICATION   Patient Details  Name: Michele Andrade MRN: [id number] Date of Birth: August 28, 1940   Medicare Observation Status Notification Given:   yes    Delrae Sawyers, RN 12/20/2015, 8:31 PM

## 2015-12-21 ENCOUNTER — Other Ambulatory Visit: Payer: Self-pay

## 2015-12-21 DIAGNOSIS — D751 Secondary polycythemia: Secondary | ICD-10-CM

## 2015-12-21 LAB — BASIC METABOLIC PANEL
ANION GAP: 5 (ref 5–15)
BUN: 24 mg/dL — AB (ref 6–20)
CHLORIDE: 105 mmol/L (ref 101–111)
CO2: 27 mmol/L (ref 22–32)
Calcium: 8.8 mg/dL — ABNORMAL LOW (ref 8.9–10.3)
Creatinine, Ser: 1.05 mg/dL — ABNORMAL HIGH (ref 0.44–1.00)
GFR calc Af Amer: 59 mL/min — ABNORMAL LOW (ref >60)
GFR, EST NON AFRICAN AMERICAN: 51 mL/min — AB (ref >60)
Glucose, Bld: 156 mg/dL — ABNORMAL HIGH (ref 65–99)
POTASSIUM: 3.3 mmol/L — AB (ref 3.5–5.1)
SODIUM: 137 mmol/L (ref 135–145)

## 2015-12-21 LAB — CBC
HEMATOCRIT: 46.9 % (ref 35.0–47.0)
HEMOGLOBIN: 16.2 g/dL — AB (ref 12.0–16.0)
MCH: 31.7 pg (ref 26.0–34.0)
MCHC: 34.6 g/dL (ref 32.0–36.0)
MCV: 91.7 fL (ref 80.0–100.0)
Platelets: 161 10*3/uL (ref 150–440)
RBC: 5.11 MIL/uL (ref 3.80–5.20)
RDW: 14.3 % (ref 11.5–14.5)
WBC: 9.2 10*3/uL (ref 3.6–11.0)

## 2015-12-21 NOTE — Clinical Social Work Note (Signed)
Clinical Social Work Assessment  Patient Details  Name: Michele Andrade MRN: [id number] Date of Birth: 05-Oct-1940  Date of referral:  12/21/15               Reason for consult:   (reported that she is a resident at [facility])                Permission sought to share information with:    Permission granted to share information::     Name::        Agency::     Relationship::     Contact Information:     Housing/Transportation Living arrangements for the past 2 months:  Single Family Home Source of Information:  Patient Patient Interpreter Needed:  None Criminal Activity/Legal Involvement Pertinent to Current Situation/Hospitalization:  No - Comment as needed Significant Relationships:    Lives with:  Self Do you feel safe going back to the place where you live?  Yes Need for family participation in patient care:  No (Coment)  Care giving concerns:  None: Patient is independent in all ADL's.   Social Worker assessment / plan:  CSW reported to that patient is from the [facility] [place]. CSW spoke with patient this morning and she confirmed that she is in the [place] [facility] which is the independent living campus. Patient states she has no concerns or needs at this time.  Employment status:  Retired Nurse, adult PT Recommendations:  Not assessed at this time Information / Referral to community resources:     Patient/Family's Response to care:  Patient expressed appreciation for CSW visit.  Patient/Family's Understanding of and Emotional Response to Diagnosis, Current Treatment, and Prognosis:  Patient very willing to speak with CSW and is very knowledgeable in her plan of care.  Emotional Assessment Appearance:  Appears stated age, Well-Groomed Attitude/Demeanor/Rapport:   (pleasant and cooperative) Affect (typically observed):  Accepting, Happy, Calm, Appropriate Orientation:  Oriented to Self, Oriented to Place, Oriented to   Time, Oriented to Situation Alcohol / Substance use:  Not Applicable Psych involvement (Current and /or in the community):  No (Comment)  Discharge Needs  Concerns to be addressed:  No discharge needs identified Readmission within the last 30 days:  No Current discharge risk:  None Barriers to Discharge:  No Barriers Identified   Shela Leff, LCSW 12/21/2015, 11:22 AM

## 2015-12-21 NOTE — Discharge Instructions (Signed)

## 2015-12-21 NOTE — Progress Notes (Signed)
Dr Clois Dupes notified phlebotomy not done on floor. Would need to be done in am in special procedure. Verbalized understanding.

## 2015-12-21 NOTE — Care Management Obs Status (Signed)
[place] NOTIFICATION   Patient Details  Name: Michele Andrade MRN: [id number] Date of Birth: [birth year]   Medicare Observation Status Notification Given:  No (Admitted observation less than 24 hours)    Beverly Sessions, RN 12/21/2015, 12:20 PM

## 2015-12-21 NOTE — Progress Notes (Signed)
Patient is being discharge in a stable condition with sbp  Within limit ,summary and f/u care given , verbalized understanding , left with a friend

## 2015-12-21 NOTE — Discharge Summary (Signed)
[facility] [place] NAME: Michele Andrade    MR#:  [id number]  DATE OF BIRTH:  18-Jun-1940  DATE OF ADMISSION:  12/20/2015 ADMITTING PHYSICIAN: Gladstone Lighter, MD  DATE OF DISCHARGE: 12/21/2015  2:17 PM  PRIMARY CARE PHYSICIAN: SPARKS,JEFFREY D, MD    ADMISSION DIAGNOSIS:  Malignant hypertension [I10] Hypertensive urgency [I16.0]  DISCHARGE DIAGNOSIS:  Active Problems:   Hypertensive urgency   SECONDARY DIAGNOSIS:   Past Medical History:  Diagnosis Date  . Breast cancer ([place]) 1999   right breast ca  . Erythrocytosis 09/30/2014  . Hypertension     HOSPITAL COURSE:   75 year old female with past medical history of essential hypertension, polycythemia vera, history of breast cancer who presents to the hospital due to accelerated hypertension and headache.  1. Hypertensive urgency/accelerated hypertension-patient was admitted to the hospital started on her maintenance antihypertensives and also given IV hydralazine. -She has clinically improved as her blood pressures close to baseline now. She does not have had any headache or any acute neurological symptomatology. -She is being discharged home on a maintenance antihypertensives.  2. Polycythemia vera with erythrocytosis-patient gets intermittent phlebotomies as an outpatient per hematology. Her hematocrit usually is between 43-47 and she presented with a hematocrit of 53. She was given some IV fluids and this has improved. Discussed with hematology and they will arrange for an outpatient phlebotomy as she is clinically asymptomatic now.  3. History of breast cancer-no acute issue presently she will continue follow-up with oncologist outpatient.  DISCHARGE CONDITIONS:   Stable  CONSULTS OBTAINED:    DRUG ALLERGIES:  No Known Allergies  DISCHARGE MEDICATIONS:     Medication List    TAKE these medications   aspirin 325 MG tablet Take 325 mg by mouth daily.   Calcium  Carbonate-Vitamin D 600-400 MG-UNIT tablet Take 1 tablet by mouth 2 (two) times daily.   Co Q-10 100 MG Caps Take 100 mg by mouth every morning.   Fish Oil 1000 MG Caps Take 1,000 mg by mouth 2 (two) times daily.   lisinopril 10 MG tablet Commonly known as:  PRINIVIL,ZESTRIL Take 10 mg by mouth every morning.   MAGNESIUM GLYCINATE PLUS PO Take 200 mg by mouth every evening.   MULTI-VITAMINS Tabs Take 1 tablet by mouth 2 (two) times daily.   propranolol ER 80 MG 24 hr capsule Commonly known as:  INDERAL LA Take 80 mg by mouth every morning.   riboflavin 100 MG Tabs tablet Commonly known as:  VITAMIN B-2 Take 300 mg by mouth 2 (two) times daily.   TURMERIC PO Take 1 tablet by mouth 2 (two) times daily.         DISCHARGE INSTRUCTIONS:   DIET:  Cardiac diet  DISCHARGE CONDITION:  Stable  ACTIVITY:  Activity as tolerated  OXYGEN:  Home Oxygen: No.   Oxygen Delivery: room air  DISCHARGE LOCATION:  home   If you experience worsening of your admission symptoms, develop shortness of breath, life threatening emergency, suicidal or homicidal thoughts you must seek medical attention immediately by calling 911 or calling your MD immediately  if symptoms less severe.  You Must read complete instructions/literature along with all the possible adverse reactions/side effects for all the Medicines you take and that have been prescribed to you. Take any new Medicines after you have completely understood and accpet all the possible adverse reactions/side effects.   Please note  You were cared for by a hospitalist during your hospital stay.  If you have any questions about your discharge medications or the care you received while you were in the hospital after you are discharged, you can call the unit and asked to speak with the hospitalist on call if the hospitalist that took care of you is not available. Once you are discharged, your primary care physician will handle any  further medical issues. Please note that NO REFILLS for any discharge medications will be authorized once you are discharged, as it is imperative that you return to your primary care physician (or establish a relationship with a primary care physician if you do not have one) for your aftercare needs so that they can reassess your need for medications and monitor your lab values.     Today   No headache.  BP improved.    VITAL SIGNS:  Blood pressure 113/70, pulse 63, temperature 98.4 F (36.9 C), temperature source Oral, resp. rate 16, height 5\' 2"  (1.575 m), weight 62.8 kg (138 lb 8 oz), SpO2 99 %.  I/O:   Intake/Output Summary (Last 24 hours) at 12/21/15 1556 Last data filed at 12/21/15 0900  Gross per 24 hour  Intake              240 ml  Output                0 ml  Net              240 ml    PHYSICAL EXAMINATION:  GENERAL:  75 y.o.-year-old patient lying in the bed with no acute distress.  EYES: Pupils equal, round, reactive to light and accommodation. No scleral icterus. Extraocular muscles intact.  HEENT: Head atraumatic, normocephalic. Oropharynx and nasopharynx clear.  NECK:  Supple, no jugular venous distention. No thyroid enlargement, no tenderness.  LUNGS: Normal breath sounds bilaterally, no wheezing, rales,rhonchi. No use of accessory muscles of respiration.  CARDIOVASCULAR: S1, S2 normal. No murmurs, rubs, or gallops.  ABDOMEN: Soft, non-tender, non-distended. Bowel sounds present. No organomegaly or mass.  EXTREMITIES: No pedal edema, cyanosis, or clubbing.  NEUROLOGIC: Cranial nerves II through XII are intact. No focal motor or sensory defecits b/l.  PSYCHIATRIC: The patient is alert and oriented x 3. Good affect.  SKIN: No obvious rash, lesion, or ulcer.   DATA REVIEW:   CBC  Recent Labs Lab 12/21/15 0419  WBC 9.2  HGB 16.2*  HCT 46.9  PLT 161    Chemistries   Recent Labs Lab 12/20/15 1757 12/21/15 0419  NA 140 137  K 4.6 3.3*  CL 105 105  CO2  27 27  GLUCOSE 107* 156*  BUN 20 24*  CREATININE 0.88 1.05*  CALCIUM 9.5 8.8*  AST 28  --   ALT 19  --   ALKPHOS 51  --   BILITOT 0.8  --     Cardiac Enzymes  Recent Labs Lab 12/20/15 1757  TROPONINI <0.03    Microbiology Results  Results for orders placed or performed in visit on 12/11/13  Urine culture     Status: None   Collection Time: 12/11/13  2:12 PM  Result Value Ref Range Status   Micro Text Report   Final       SOURCE: NO SOURCE INDICATED    COMMENT                   MIXED BACTERIAL ORGANISMS   COMMENT  RESULTS SUGGESTIVE OF CONTAMINATION   ANTIBIOTIC                                                        RADIOLOGY:  Ct Head Wo Contrast  Result Date: 12/20/2015 CLINICAL DATA:  Hypertension.  History of breast cancer.  Headache. EXAM: CT HEAD WITHOUT CONTRAST TECHNIQUE: Contiguous axial images were obtained from the base of the skull through the vertex without intravenous contrast. COMPARISON:  11/01/2011 and 11/14/2011 FINDINGS: The brainstem, cerebellum, cerebral peduncles, thalami, basal ganglia, basilar cisterns, and ventricular system appear within normal limits. No intracranial hemorrhage, mass lesion, or acute CVA. Visualized paranasal sinuses appear clear. IMPRESSION: 1.  No significant abnormality identified. Electronically Signed   By: Van Clines M.D.   On: 12/20/2015 18:21   Dg Chest Portable 1 View  Result Date: 12/20/2015 CLINICAL DATA:  Hypertension EXAM: PORTABLE CHEST 1 VIEW COMPARISON:  11/01/2011 FINDINGS: The heart size and mediastinal contours are within normal limits. Both lungs are clear. The visualized skeletal structures are unremarkable. IMPRESSION: No active disease. Electronically Signed   By: Inez Catalina M.D.   On: 12/20/2015 18:29      Management plans discussed with the patient, family and they are in agreement.  CODE STATUS:     Code Status Orders        Start     Ordered   12/20/15 2140  Full  code  Continuous     12/20/15 2139    Code Status History    Date Active Date Inactive Code Status Order ID Comments User Context   This patient has a current code status but no historical code status.    Advance Directive Documentation   [place] Most Recent Value  Type of Advance Directive  Living will  Pre-existing out of facility DNR order (yellow form or pink MOST form)  No data  "MOST" Form in Place?  No data      TOTAL TIME TAKING CARE OF THIS PATIENT: 40 minutes.    Henreitta Leber M.D on 12/21/2015 at 3:56 PM  Between 7am to 6pm - Pager - [phone]  After 6pm go to [email or web address] - password [email or web address] [facility]  Office  [phone]  CC: Primary care physician; Idelle Crouch, MD

## 2015-12-22 ENCOUNTER — Other Ambulatory Visit: Payer: Medicare Other

## 2015-12-22 ENCOUNTER — Inpatient Hospital Stay: Payer: Medicare Other | Attending: Internal Medicine

## 2015-12-22 VITALS — BP 148/88 | HR 60 | Temp 97.8°F | Resp 18

## 2015-12-22 DIAGNOSIS — Z9011 Acquired absence of right breast and nipple: Secondary | ICD-10-CM | POA: Diagnosis not present

## 2015-12-22 DIAGNOSIS — Z7982 Long term (current) use of aspirin: Secondary | ICD-10-CM | POA: Insufficient documentation

## 2015-12-22 DIAGNOSIS — D751 Secondary polycythemia: Secondary | ICD-10-CM | POA: Insufficient documentation

## 2015-12-22 DIAGNOSIS — Z79899 Other long term (current) drug therapy: Secondary | ICD-10-CM | POA: Insufficient documentation

## 2015-12-22 DIAGNOSIS — Z853 Personal history of malignant neoplasm of breast: Secondary | ICD-10-CM | POA: Diagnosis not present

## 2015-12-22 DIAGNOSIS — I10 Essential (primary) hypertension: Secondary | ICD-10-CM | POA: Insufficient documentation

## 2015-12-22 NOTE — Progress Notes (Signed)
Hct: 46.9 on 12/21/15. MD, Dr. Rogue Bussing, notified via telephone. Per MD order: proceed with phlebotomy of 356ml today.

## 2015-12-23 ENCOUNTER — Inpatient Hospital Stay: Payer: Medicare Other | Admitting: Internal Medicine

## 2015-12-28 ENCOUNTER — Other Ambulatory Visit: Payer: Medicare Other

## 2015-12-28 ENCOUNTER — Inpatient Hospital Stay: Payer: Medicare Other

## 2015-12-28 ENCOUNTER — Encounter (INDEPENDENT_AMBULATORY_CARE_PROVIDER_SITE_OTHER): Payer: Self-pay

## 2015-12-28 DIAGNOSIS — D751 Secondary polycythemia: Secondary | ICD-10-CM | POA: Diagnosis not present

## 2015-12-28 LAB — HEMATOCRIT: HCT: 45.5 % (ref 35.0–47.0)

## 2015-12-28 LAB — HEMOGLOBIN: Hemoglobin: 15.6 g/dL (ref 12.0–16.0)

## 2016-01-04 ENCOUNTER — Inpatient Hospital Stay: Payer: Medicare Other

## 2016-01-04 ENCOUNTER — Other Ambulatory Visit: Payer: Medicare Other

## 2016-01-04 ENCOUNTER — Inpatient Hospital Stay (HOSPITAL_BASED_OUTPATIENT_CLINIC_OR_DEPARTMENT_OTHER): Payer: Medicare Other | Admitting: Internal Medicine

## 2016-01-04 VITALS — BP 162/78 | HR 58 | Temp 97.7°F | Resp 18 | Wt 139.2 lb

## 2016-01-04 DIAGNOSIS — D751 Secondary polycythemia: Secondary | ICD-10-CM | POA: Diagnosis not present

## 2016-01-04 DIAGNOSIS — Z9011 Acquired absence of right breast and nipple: Secondary | ICD-10-CM

## 2016-01-04 DIAGNOSIS — Z79899 Other long term (current) drug therapy: Secondary | ICD-10-CM | POA: Diagnosis not present

## 2016-01-04 DIAGNOSIS — Z853 Personal history of malignant neoplasm of breast: Secondary | ICD-10-CM

## 2016-01-04 LAB — HEMOGLOBIN: HEMOGLOBIN: 13.9 g/dL (ref 12.0–16.0)

## 2016-01-04 LAB — HEMATOCRIT: HEMATOCRIT: 40.7 % (ref 35.0–47.0)

## 2016-01-04 NOTE — Progress Notes (Signed)
Patient states she went to ED on August 13th for BP spike.  She has been closely monitoring her BP since that time.  No other complaints at this time.

## 2016-01-04 NOTE — Progress Notes (Signed)
[facility] OFFICE PROGRESS NOTE  Patient Care Team: Idelle Crouch, MD as PCP - General (Internal Medicine)   SUMMARY OF ONCOLOGIC HISTORY:  # April 2015- ERTHROCYTOSIS ; JAK-2/Exon- 12NEG; Epo-N; June 2017-  Phlebotomy every 3 months  # 1999-RIGHT BREAST- Mastec & ALND [[place]] & chemo; Tam x 5 years   INTERVAL HISTORY:  A very pleasant 75 year old female patient with above history of erythrocytosis since  April 2015 is here for follow-up/ Phlebotomy.   Patient was recently admitted to the hospital for uncontrolled blood pressure/noted to have hematocrit of 53. Patient has been restarted back on phlebotomies. Blood pressure up and down; however much improved. Clonidine has been added.   Denies any  Headaches or fatigue or thromboembolic events. She is an aspirin once a day.  Patient last phlebotomy was last week- today hematocrit is 40.   REVIEW OF SYSTEMS:  A complete 10 point review of system is done which is negative except mentioned above/history of present illness.   PAST MEDICAL HISTORY :  Past Medical History:  Diagnosis Date  . Breast cancer ([place]) 1999   right breast ca  . Erythrocytosis 09/30/2014  . Hypertension     PAST SURGICAL HISTORY :   Past Surgical History:  Procedure Laterality Date  . MASTECTOMY Right 1999  . rotator cuff      FAMILY HISTORY :   Family History  Problem Relation Age of Onset  . Breast cancer Mother 34    SOCIAL HISTORY:   Social History  Substance Use Topics  . Smoking status: Never Smoker  . Smokeless tobacco: Never Used  . Alcohol use 0.6 oz/week    1 Glasses of wine per week     Comment: BID prn    ALLERGIES:  has No Known Allergies.  MEDICATIONS:  Current Outpatient Prescriptions  Medication Sig Dispense Refill  . aspirin 325 MG tablet Take 325 mg by mouth daily.    . Calcium Carbonate-Vitamin D 600-400 MG-UNIT per tablet Take 1 tablet by mouth 2 (two) times daily.     . Coenzyme Q10 (CO Q-10)  100 MG CAPS Take 100 mg by mouth every morning.     Marland Kitchen lisinopril (PRINIVIL,ZESTRIL) 10 MG tablet Take 10 mg by mouth every morning.     Marland Kitchen MAGNESIUM GLYCINATE PLUS PO Take 200 mg by mouth every evening.     . Multiple Vitamin (MULTI-VITAMINS) TABS Take 1 tablet by mouth 2 (two) times daily.     . Omega-3 Fatty Acids (FISH OIL) 1000 MG CAPS Take 1,000 mg by mouth 2 (two) times daily.     . propranolol ER (INDERAL LA) 80 MG 24 hr capsule Take 80 mg by mouth every morning.    . riboflavin (VITAMIN B-2) 100 MG TABS tablet Take 300 mg by mouth 2 (two) times daily.     . TURMERIC PO Take 1 tablet by mouth 2 (two) times daily.    . cloNIDine (CATAPRES) 0.1 MG tablet      No current facility-administered medications for this visit.     PHYSICAL EXAMINATION: ECOG PERFORMANCE STATUS: 0 - Asymptomatic  BP (!) 162/78 (BP Location: Left Arm, Patient Position: Sitting)   Pulse (!) 58   Temp 97.7 F (36.5 C) (Tympanic)   Resp 18   Wt 139 lb 4 oz (63.2 kg)   BMI 25.47 kg/m   Filed Weights   01/04/16 1126  Weight: 139 lb 4 oz (63.2 kg)    GENERAL: Well-nourished well-developed; Alert,  no distress and comfortable.  Alone.  EYES: no pallor or icterus OROPHARYNX: no thrush or ulceration; good dentition  NECK: supple, no masses felt LYMPH:  no palpable lymphadenopathy in the cervical, axillary or inguinal regions LUNGS: clear to auscultation and  No wheeze or crackles HEART/CVS: regular rate & rhythm and no murmurs; No lower extremity edema ABDOMEN:abdomen soft, non-tender and normal bowel sounds Musculoskeletal:no cyanosis of digits and no clubbing  PSYCH: alert & oriented x 3 with fluent speech NEURO: no focal motor/sensory deficits SKIN:  no rashes or significant lesions  LABORATORY DATA:  I have reviewed the data as listed    Component Value Date/Time   NA 137 12/21/2015 0419   NA 142 06/04/2014 1443   K 3.3 (L) 12/21/2015 0419   K 4.1 06/04/2014 1443   CL 105 12/21/2015 0419   CL  108 (H) 06/04/2014 1443   CO2 27 12/21/2015 0419   CO2 26 06/04/2014 1443   GLUCOSE 156 (H) 12/21/2015 0419   GLUCOSE 99 06/04/2014 1443   BUN 24 (H) 12/21/2015 0419   BUN 18 06/04/2014 1443   CREATININE 1.05 (H) 12/21/2015 0419   CREATININE 0.92 06/04/2014 1443   CALCIUM 8.8 (L) 12/21/2015 0419   CALCIUM 9.0 06/04/2014 1443   PROT 6.8 12/20/2015 1757   PROT 6.8 06/04/2014 1443   ALBUMIN 4.1 12/20/2015 1757   ALBUMIN 3.4 06/04/2014 1443   AST 28 12/20/2015 1757   AST 25 06/04/2014 1443   ALT 19 12/20/2015 1757   ALT 23 06/04/2014 1443   ALKPHOS 51 12/20/2015 1757   ALKPHOS 52 06/04/2014 1443   BILITOT 0.8 12/20/2015 1757   BILITOT 0.3 06/04/2014 1443   GFRNONAA 51 (L) 12/21/2015 0419   GFRNONAA >60 06/04/2014 1443   GFRNONAA 51 (L) 12/11/2013 1412   GFRAA 59 (L) 12/21/2015 0419   GFRAA >60 06/04/2014 1443   GFRAA 59 (L) 12/11/2013 1412    No results found for: SPEP, UPEP  Lab Results  Component Value Date   WBC 9.2 12/21/2015   NEUTROABS 4.0 12/20/2015   HGB 13.9 01/04/2016   HCT 40.7 01/04/2016   MCV 91.7 12/21/2015   PLT 161 12/21/2015      Chemistry      Component Value Date/Time   NA 137 12/21/2015 0419   NA 142 06/04/2014 1443   K 3.3 (L) 12/21/2015 0419   K 4.1 06/04/2014 1443   CL 105 12/21/2015 0419   CL 108 (H) 06/04/2014 1443   CO2 27 12/21/2015 0419   CO2 26 06/04/2014 1443   BUN 24 (H) 12/21/2015 0419   BUN 18 06/04/2014 1443   CREATININE 1.05 (H) 12/21/2015 0419   CREATININE 0.92 06/04/2014 1443      Component Value Date/Time   CALCIUM 8.8 (L) 12/21/2015 0419   CALCIUM 9.0 06/04/2014 1443   ALKPHOS 51 12/20/2015 1757   ALKPHOS 52 06/04/2014 1443   AST 28 12/20/2015 1757   AST 25 06/04/2014 1443   ALT 19 12/20/2015 1757   ALT 23 06/04/2014 1443   BILITOT 0.8 12/20/2015 1757   BILITOT 0.3 06/04/2014 1443          ASSESSMENT & PLAN:   Erythrocytosis # ERYTHROCYTOSIS-  Unclear etiology. 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  Patient is asymptomatic except for recently elevated blood pressure.   # Elevated blood pressure- /Patient's hemoglobin today is 15.0 hematocrit 40. Continue phlebotomy  q 6  weeks if  Hematocrit greater than 45  #  Patient will follow-up with me in approximately 6 months possible phlebotomy/  H&H- every 6 week months.      Cammie Sickle, MD 01/04/2016 1:22 PM

## 2016-01-04 NOTE — Assessment & Plan Note (Addendum)
#   ERYTHROCYTOSIS-  Unclear etiology. 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  Patient is asymptomatic except for recently elevated blood pressure.   # Elevated blood pressure- /Patient's hemoglobin today is 15.0 hematocrit 40. Continue phlebotomy  q 6  weeks if  Hematocrit greater than 45  #  Patient will follow-up with me in approximately 6 months possible phlebotomy/  H&H- every 6 week months.

## 2016-01-08 ENCOUNTER — Other Ambulatory Visit: Payer: Medicare Other

## 2016-01-12 ENCOUNTER — Other Ambulatory Visit: Payer: Medicare Other

## 2016-01-13 ENCOUNTER — Ambulatory Visit: Payer: Medicare Other | Admitting: Internal Medicine

## 2016-01-13 ENCOUNTER — Other Ambulatory Visit: Payer: Medicare Other

## 2016-01-18 ENCOUNTER — Ambulatory Visit: Payer: Medicare Other | Admitting: Internal Medicine

## 2016-01-18 ENCOUNTER — Ambulatory Visit: Payer: Medicare Other

## 2016-01-18 ENCOUNTER — Other Ambulatory Visit: Payer: Medicare Other

## 2016-02-15 ENCOUNTER — Inpatient Hospital Stay: Payer: Medicare Other | Attending: Internal Medicine

## 2016-02-15 ENCOUNTER — Other Ambulatory Visit: Payer: Self-pay

## 2016-02-15 ENCOUNTER — Inpatient Hospital Stay: Payer: Medicare Other

## 2016-02-15 VITALS — BP 147/82 | HR 60 | Temp 97.0°F | Resp 20

## 2016-02-15 DIAGNOSIS — D751 Secondary polycythemia: Secondary | ICD-10-CM | POA: Insufficient documentation

## 2016-02-15 DIAGNOSIS — Z853 Personal history of malignant neoplasm of breast: Secondary | ICD-10-CM | POA: Diagnosis not present

## 2016-02-15 LAB — HEMATOCRIT: HEMATOCRIT: 45.5 % (ref 35.0–47.0)

## 2016-02-15 LAB — HEMOGLOBIN: HEMOGLOBIN: 15.3 g/dL (ref 12.0–16.0)

## 2016-02-15 NOTE — Progress Notes (Signed)
Dr. Rogue Bussing gave verbal order to do phlebotomy today with HCT 45.5.  Remove 300 ml.

## 2016-03-28 ENCOUNTER — Inpatient Hospital Stay: Payer: Medicare Other

## 2016-03-28 ENCOUNTER — Inpatient Hospital Stay: Payer: Medicare Other | Attending: Internal Medicine

## 2016-03-28 ENCOUNTER — Other Ambulatory Visit: Payer: Self-pay | Admitting: Internal Medicine

## 2016-03-28 VITALS — BP 125/78 | HR 57 | Temp 97.2°F | Resp 18

## 2016-03-28 DIAGNOSIS — D751 Secondary polycythemia: Secondary | ICD-10-CM

## 2016-03-28 LAB — HEMOGLOBIN: HEMOGLOBIN: 15.6 g/dL (ref 12.0–16.0)

## 2016-03-28 LAB — HEMATOCRIT: HEMATOCRIT: 46.1 % (ref 35.0–47.0)

## 2016-03-28 NOTE — Progress Notes (Signed)
Hematocrit: 46.1. MD, Dr. Rogue Bussing, notified via telephone and aware. Per MD order: proceed with 383ml Phlebotomy today.

## 2016-04-08 ENCOUNTER — Ambulatory Visit: Payer: Medicare Other | Admitting: Internal Medicine

## 2016-04-08 ENCOUNTER — Other Ambulatory Visit: Payer: Medicare Other

## 2016-05-09 ENCOUNTER — Inpatient Hospital Stay: Payer: Medicare Other

## 2016-05-10 ENCOUNTER — Other Ambulatory Visit: Payer: Self-pay | Admitting: Internal Medicine

## 2016-05-10 ENCOUNTER — Inpatient Hospital Stay: Payer: Medicare Other

## 2016-05-10 ENCOUNTER — Inpatient Hospital Stay: Payer: Medicare Other | Attending: Internal Medicine

## 2016-05-10 DIAGNOSIS — D751 Secondary polycythemia: Secondary | ICD-10-CM | POA: Insufficient documentation

## 2016-05-10 LAB — HEMOGLOBIN: Hemoglobin: 15.8 g/dL (ref 12.0–16.0)

## 2016-05-10 LAB — HEMATOCRIT: HEMATOCRIT: 47.6 % — AB (ref 35.0–47.0)

## 2016-06-09 ENCOUNTER — Other Ambulatory Visit: Payer: Self-pay | Admitting: Internal Medicine

## 2016-06-09 DIAGNOSIS — I10 Essential (primary) hypertension: Secondary | ICD-10-CM

## 2016-06-14 ENCOUNTER — Other Ambulatory Visit: Payer: Self-pay | Admitting: Internal Medicine

## 2016-06-14 DIAGNOSIS — I159 Secondary hypertension, unspecified: Secondary | ICD-10-CM

## 2016-06-15 ENCOUNTER — Ambulatory Visit: Admission: RE | Admit: 2016-06-15 | Payer: Medicare Other | Source: Ambulatory Visit

## 2016-06-15 ENCOUNTER — Ambulatory Visit
Admission: RE | Admit: 2016-06-15 | Discharge: 2016-06-15 | Disposition: A | Discharge status: Home / Self Care | Payer: Medicare Other | Source: Ambulatory Visit | Attending: Internal Medicine | Admitting: Internal Medicine

## 2016-06-15 DIAGNOSIS — I159 Secondary hypertension, unspecified: Secondary | ICD-10-CM | POA: Diagnosis present

## 2016-06-16 ENCOUNTER — Ambulatory Visit: Payer: Medicare Other

## 2016-06-20 ENCOUNTER — Inpatient Hospital Stay: Payer: Medicare Other

## 2016-06-20 ENCOUNTER — Inpatient Hospital Stay: Payer: Medicare Other | Attending: Internal Medicine | Admitting: Internal Medicine

## 2016-06-20 VITALS — BP 166/83 | HR 59 | Temp 97.6°F | Wt 142.0 lb

## 2016-06-20 DIAGNOSIS — I10 Essential (primary) hypertension: Secondary | ICD-10-CM | POA: Diagnosis not present

## 2016-06-20 DIAGNOSIS — Z9011 Acquired absence of right breast and nipple: Secondary | ICD-10-CM | POA: Diagnosis not present

## 2016-06-20 DIAGNOSIS — Z853 Personal history of malignant neoplasm of breast: Secondary | ICD-10-CM | POA: Diagnosis not present

## 2016-06-20 DIAGNOSIS — D751 Secondary polycythemia: Secondary | ICD-10-CM

## 2016-06-20 DIAGNOSIS — Z9221 Personal history of antineoplastic chemotherapy: Secondary | ICD-10-CM | POA: Diagnosis not present

## 2016-06-20 DIAGNOSIS — Z170 Estrogen receptor positive status [ER+]: Secondary | ICD-10-CM | POA: Diagnosis not present

## 2016-06-20 DIAGNOSIS — Z9223 Personal history of estrogen therapy: Secondary | ICD-10-CM | POA: Diagnosis not present

## 2016-06-20 DIAGNOSIS — Z7982 Long term (current) use of aspirin: Secondary | ICD-10-CM | POA: Diagnosis not present

## 2016-06-20 LAB — HEMATOCRIT: HCT: 45.9 % (ref 35.0–47.0)

## 2016-06-20 LAB — HEMOGLOBIN: HEMOGLOBIN: 15.6 g/dL (ref 12.0–16.0)

## 2016-06-20 NOTE — Progress Notes (Signed)
[place] OFFICE PROGRESS NOTE  Patient Care Team: Idelle Crouch, MD as PCP - General (Internal Medicine)   SUMMARY OF ONCOLOGIC HISTORY:  # April 2015- ERTHROCYTOSIS ; JAK-2/Exon- 12NEG; Epo-N; June 2017-  Phlebotomy every 3 months  # 1999-RIGHT BREAST- Mastec & ALND [[place]] & chemo; Tam x 5 years   INTERVAL HISTORY:  A very pleasant 76 year old female patient with above history of erythrocytosis since  April 2015 is here for follow-up/ Phlebotomy.   Patient states that recently she is noted to have high blood pressure- and this is being worked up up by Dr. Doy Hutching  She continues to have intermittent episodes of elevated blood pressure anemia take when necessary clonidine. Continues to have intermittent headaches and her blood pressures go up. Otherwise no strokes. No swelling of the legs. [place].   REVIEW OF SYSTEMS:  A complete 10 point review of system is done which is negative except mentioned above/history of present illness.   PAST MEDICAL HISTORY :  Past Medical History:  Diagnosis Date  . Breast cancer ([place]) 1999   right breast ca  . Erythrocytosis 09/30/2014  . Hypertension     PAST SURGICAL HISTORY :   Past Surgical History:  Procedure Laterality Date  . MASTECTOMY Right 1999  . rotator cuff      FAMILY HISTORY :   Family History  Problem Relation Age of Onset  . Breast cancer Mother 61    SOCIAL HISTORY:   Social History  Substance Use Topics  . Smoking status: Never Smoker  . Smokeless tobacco: Never Used  . Alcohol use 0.6 oz/week    1 Glasses of wine per week     Comment: BID prn    ALLERGIES:  has No Known Allergies.  MEDICATIONS:  Current Outpatient Prescriptions  Medication Sig Dispense Refill  . aspirin 325 MG tablet Take 325 mg by mouth daily.    . Calcium Carbonate-Vitamin D 600-400 MG-UNIT per tablet Take 1 tablet by mouth 2 (two) times daily.      . cloNIDine (CATAPRES) 0.1 MG tablet Take 0.1 mg by mouth 3 (three) times daily.     . Coenzyme Q10 (CO Q-10) 100 MG CAPS Take 100 mg by mouth every morning.     Marland Kitchen lisinopril (PRINIVIL,ZESTRIL) 10 MG tablet Take 10 mg by mouth 3 (three) times daily.     Marland Kitchen MAGNESIUM GLYCINATE PLUS PO Take 200 mg by mouth every evening.     . Multiple Vitamin (MULTI-VITAMINS) TABS Take 1 tablet by mouth 2 (two) times daily.     . Omega-3 Fatty Acids (FISH OIL) 1000 MG CAPS Take 1,000 mg by mouth 2 (two) times daily.     . propranolol ER (INDERAL LA) 80 MG 24 hr capsule Take 80 mg by mouth every morning.    . riboflavin (VITAMIN B-2) 100 MG TABS tablet Take 300 mg by mouth 2 (two) times daily.     . TURMERIC PO Take 1 tablet by mouth 2 (two) times daily.     No current facility-administered medications for this visit.     PHYSICAL EXAMINATION: ECOG PERFORMANCE STATUS: 0 - Asymptomatic  BP (!) 166/83 (BP Location: Left Arm, Patient Position: Sitting)   Pulse (!) 59   Temp 97.6 F (36.4 C) (Tympanic)   Wt 142 lb (64.4 kg)   BMI 25.97 kg/m   Filed Weights   06/20/16 1340  Weight: 142  lb (64.4 kg)    GENERAL: Well-nourished well-developed; Alert, no distress and comfortable.  Alone.  EYES: no pallor or icterus OROPHARYNX: no thrush or ulceration; good dentition  NECK: supple, no masses felt LYMPH:  no palpable lymphadenopathy in the cervical, axillary or inguinal regions LUNGS: clear to auscultation and  No wheeze or crackles HEART/CVS: regular rate & rhythm and no murmurs; No lower extremity edema ABDOMEN:abdomen soft, non-tender and normal bowel sounds Musculoskeletal:no cyanosis of digits and no clubbing  PSYCH: alert & oriented x 3 with fluent speech NEURO: no focal motor/sensory deficits SKIN:  no rashes or significant lesions  LABORATORY DATA:  I have reviewed the data as listed    Component Value Date/Time   NA 137 12/21/2015 0419   NA 142 06/04/2014 1443   K 3.3 (L) 12/21/2015  0419   K 4.1 06/04/2014 1443   CL 105 12/21/2015 0419   CL 108 (H) 06/04/2014 1443   CO2 27 12/21/2015 0419   CO2 26 06/04/2014 1443   GLUCOSE 156 (H) 12/21/2015 0419   GLUCOSE 99 06/04/2014 1443   BUN 24 (H) 12/21/2015 0419   BUN 18 06/04/2014 1443   CREATININE 1.05 (H) 12/21/2015 0419   CREATININE 0.92 06/04/2014 1443   CALCIUM 8.8 (L) 12/21/2015 0419   CALCIUM 9.0 06/04/2014 1443   PROT 6.8 12/20/2015 1757   PROT 6.8 06/04/2014 1443   ALBUMIN 4.1 12/20/2015 1757   ALBUMIN 3.4 06/04/2014 1443   AST 28 12/20/2015 1757   AST 25 06/04/2014 1443   ALT 19 12/20/2015 1757   ALT 23 06/04/2014 1443   ALKPHOS 51 12/20/2015 1757   ALKPHOS 52 06/04/2014 1443   BILITOT 0.8 12/20/2015 1757   BILITOT 0.3 06/04/2014 1443   GFRNONAA 51 (L) 12/21/2015 0419   GFRNONAA >60 06/04/2014 1443   GFRNONAA 51 (L) 12/11/2013 1412   GFRAA 59 (L) 12/21/2015 0419   GFRAA >60 06/04/2014 1443   GFRAA 59 (L) 12/11/2013 1412    No results found for: SPEP, UPEP  Lab Results  Component Value Date   WBC 9.2 12/21/2015   NEUTROABS 4.0 12/20/2015   HGB 15.6 06/20/2016   HCT 45.9 06/20/2016   MCV 91.7 12/21/2015   PLT 161 12/21/2015      Chemistry      Component Value Date/Time   NA 137 12/21/2015 0419   NA 142 06/04/2014 1443   K 3.3 (L) 12/21/2015 0419   K 4.1 06/04/2014 1443   CL 105 12/21/2015 0419   CL 108 (H) 06/04/2014 1443   CO2 27 12/21/2015 0419   CO2 26 06/04/2014 1443   BUN 24 (H) 12/21/2015 0419   BUN 18 06/04/2014 1443   CREATININE 1.05 (H) 12/21/2015 0419   CREATININE 0.92 06/04/2014 1443      Component Value Date/Time   CALCIUM 8.8 (L) 12/21/2015 0419   CALCIUM 9.0 06/04/2014 1443   ALKPHOS 51 12/20/2015 1757   ALKPHOS 52 06/04/2014 1443   AST 28 12/20/2015 1757   AST 25 06/04/2014 1443   ALT 19 12/20/2015 1757   ALT 23 06/04/2014 1443   BILITOT 0.8 12/20/2015 1757   BILITOT 0.3 06/04/2014 1443          ASSESSMENT & PLAN:   Erythrocytosis # ERYTHROCYTOSIS-   Unclear etiology. 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  Patient is asymptomatic except for elevated blood pressure. Proceed with phlebotomy today as hematocrit is 45.9.   # Elevated blood pressure- 16/86s/ /Patient's hemoglobin today is 15.6  hematocrit 45.9. Continue phlebotomy  q 6  weeks if  Hematocrit greater than 45 Work up in process with PCP. Discussed re: relaxation techniques.   #  Patient will follow-up with me in approximately 6 months possible phlebotomy/  H&H- every 6 week months.      Cammie Sickle, MD 06/20/2016 3:07 PM

## 2016-06-20 NOTE — Assessment & Plan Note (Addendum)
#   ERYTHROCYTOSIS-  Unclear etiology. 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  Patient is asymptomatic except for elevated blood pressure. Proceed with phlebotomy today as hematocrit is 45.9.   # Elevated blood pressure- 16/86s/ /Patient's hemoglobin today is 15.6 hematocrit 45.9. Continue phlebotomy  q 6  weeks if  Hematocrit greater than 45 Work up in process with PCP. Discussed re: relaxation techniques.   #  Patient will follow-up with me in approximately 6 months possible phlebotomy/  H&H- every 6 week months.

## 2016-06-20 NOTE — Progress Notes (Signed)
Patient here today for follow up.   

## 2016-06-20 NOTE — Addendum Note (Signed)
Addended by: Jacqualyn Posey B on: 06/20/2016 03:30 PM   Modules accepted: Orders

## 2016-06-23 ENCOUNTER — Other Ambulatory Visit: Payer: Self-pay | Admitting: Internal Medicine

## 2016-06-23 DIAGNOSIS — Z1231 Encounter for screening mammogram for malignant neoplasm of breast: Secondary | ICD-10-CM

## 2016-07-06 ENCOUNTER — Ambulatory Visit: Payer: Medicare Other | Admitting: Internal Medicine

## 2016-07-06 ENCOUNTER — Other Ambulatory Visit: Payer: Medicare Other

## 2016-07-15 ENCOUNTER — Ambulatory Visit: Payer: Medicare Other

## 2016-08-01 ENCOUNTER — Inpatient Hospital Stay: Payer: Medicare Other

## 2016-08-01 ENCOUNTER — Inpatient Hospital Stay: Payer: Medicare Other | Attending: Internal Medicine

## 2016-08-01 ENCOUNTER — Telehealth: Payer: Self-pay | Admitting: *Deleted

## 2016-08-01 ENCOUNTER — Inpatient Hospital Stay (HOSPITAL_BASED_OUTPATIENT_CLINIC_OR_DEPARTMENT_OTHER): Payer: Medicare Other | Admitting: Internal Medicine

## 2016-08-01 VITALS — BP 106/69 | HR 58 | Temp 98.3°F | Resp 18

## 2016-08-01 VITALS — BP 112/68 | HR 63 | Temp 97.2°F | Resp 18 | Wt 143.0 lb

## 2016-08-01 DIAGNOSIS — I10 Essential (primary) hypertension: Secondary | ICD-10-CM

## 2016-08-01 DIAGNOSIS — Z9221 Personal history of antineoplastic chemotherapy: Secondary | ICD-10-CM

## 2016-08-01 DIAGNOSIS — D751 Secondary polycythemia: Secondary | ICD-10-CM

## 2016-08-01 DIAGNOSIS — Z9011 Acquired absence of right breast and nipple: Secondary | ICD-10-CM | POA: Insufficient documentation

## 2016-08-01 DIAGNOSIS — Z853 Personal history of malignant neoplasm of breast: Secondary | ICD-10-CM | POA: Diagnosis not present

## 2016-08-01 DIAGNOSIS — Z9223 Personal history of estrogen therapy: Secondary | ICD-10-CM | POA: Insufficient documentation

## 2016-08-01 DIAGNOSIS — Z7982 Long term (current) use of aspirin: Secondary | ICD-10-CM | POA: Diagnosis not present

## 2016-08-01 LAB — HEMOGLOBIN: HEMOGLOBIN: 15.6 g/dL (ref 12.0–16.0)

## 2016-08-01 LAB — HEMATOCRIT: HEMATOCRIT: 45.9 % (ref 35.0–47.0)

## 2016-08-01 NOTE — Assessment & Plan Note (Signed)
#   ERYTHROCYTOSIS-  Unclear etiology. 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  Patient is asymptomatic except for elevated blood pressure. Proceed with phlebotomy today as hematocrit is 45.9.    # Elevated blood pressure- 016W systolic; on multiple medications as per PCP. Patient is awaiting evaluation with nephrology next week. I do not think patient is mildly erythrocytosis is the cause of her elevated blood pressure. She seems to be relieved after hearing this.    #H&H in 6 weeks is planned; follow-up with me is planned in July.

## 2016-08-01 NOTE — Telephone Encounter (Signed)
Per Dr Rogue Bussing, can see her at 3:15 AFTER she has her lab and Phlebotomy. Colette advised to add to schedule and pat in agreement with appt

## 2016-08-01 NOTE — Telephone Encounter (Signed)
Having problems with her BP spiking high to the point of incapacitation. Would like to be seen today after she has labs drawn before phlebotomy, or come in this morning. She is "begging to be seen" She has seen her PCP and had med changes several times and still has BP spiking. Please advise if she can be seen today, she is willing to come in whenever you have an opening.

## 2016-08-01 NOTE — Progress Notes (Signed)
[place] OFFICE PROGRESS NOTE  Patient Care Team: Idelle Crouch, MD as PCP - General (Internal Medicine)   SUMMARY OF ONCOLOGIC HISTORY:  # April 2015- ERTHROCYTOSIS ; JAK-2/Exon- 12NEG; Epo-N; June 2017-  Phlebotomy every 3 months  # 1999-RIGHT BREAST- Mastec & ALND [[place]] & chemo; Tam x 5 years   INTERVAL HISTORY:  A very pleasant 76 year old female patient with above history of erythrocytosis since  April 2015 is here for follow-up/ Phlebotomy.   Patient has had recent issues with elevated blood pressure; this has been extensively been worked up by PCP Dr. Doy Hutching. She is also been started on clonidine; and hydralazine also for her ongoing elevated blood pressures. She feels her blood pressure is elevated up to 161 systolic she started having headaches and fatigue.   She is extremely concerned if her history of erythrocytosis is actively contributing to her elevated blood pressures. She has been very concerned about her elevated blood pressure and feeling poorly that she canceled her trip to [place].    REVIEW OF SYSTEMS:  A complete 10 point review of system is done which is negative except mentioned above/history of present illness.   PAST MEDICAL HISTORY :  Past Medical History:  Diagnosis Date  . Breast cancer ([place]) 1999   right breast ca  . Erythrocytosis 09/30/2014  . Hypertension     PAST SURGICAL HISTORY :   Past Surgical History:  Procedure Laterality Date  . MASTECTOMY Right 1999  . rotator cuff      FAMILY HISTORY :   Family History  Problem Relation Age of Onset  . Breast cancer Mother 53    SOCIAL HISTORY:   Social History  Substance Use Topics  . Smoking status: Never Smoker  . Smokeless tobacco: Never Used  . Alcohol use 0.6 oz/week    1 Glasses of wine per week     Comment: BID prn    ALLERGIES:  has No Known Allergies.  MEDICATIONS:  Current Outpatient Prescriptions  Medication Sig Dispense Refill  . aspirin 325  MG tablet Take 325 mg by mouth daily.    . Calcium Carbonate-Vitamin D 600-400 MG-UNIT per tablet Take 1 tablet by mouth 2 (two) times daily.     . cloNIDine (CATAPRES) 0.2 MG tablet Take 0.2 mg by mouth 3 (three) times daily.    . Coenzyme Q10 (CO Q-10) 100 MG CAPS Take 100 mg by mouth every morning.     . hydrALAZINE (APRESOLINE) 50 MG tablet Take 50 mg by mouth 3 (three) times daily.    Marland Kitchen lisinopril (PRINIVIL,ZESTRIL) 10 MG tablet Take 10 mg by mouth 3 (three) times daily.     Marland Kitchen MAGNESIUM GLYCINATE PLUS PO Take 200 mg by mouth every evening.     . Multiple Vitamin (MULTI-VITAMINS) TABS Take 1 tablet by mouth 2 (two) times daily.     . Omega-3 Fatty Acids (FISH OIL) 1000 MG CAPS Take 1,000 mg by mouth 2 (two) times daily.     . propranolol ER (INDERAL LA) 80 MG 24 hr capsule Take 80 mg by mouth every morning.    . riboflavin (VITAMIN B-2) 100 MG TABS tablet Take 300 mg by mouth 2 (two) times daily.     . TURMERIC PO Take 1 tablet by mouth 2 (two) times daily.     No current facility-administered medications for this visit.     PHYSICAL EXAMINATION: ECOG PERFORMANCE STATUS: 0 - Asymptomatic  BP 112/68 (BP Location: Left Arm, Patient  Position: Sitting)   Pulse 63   Temp 97.2 F (36.2 C) (Tympanic)   Resp 18   Wt 143 lb (64.9 kg)   BMI 26.16 kg/m   Filed Weights   08/01/16 1527  Weight: 143 lb (64.9 kg)    GENERAL: Well-nourished well-developed; Alert, no distress and comfortable.  Alone.  EYES: no pallor or icterus OROPHARYNX: no thrush or ulceration; good dentition  NECK: supple, no masses felt LYMPH:  no palpable lymphadenopathy in the cervical, axillary or inguinal regions LUNGS: clear to auscultation and  No wheeze or crackles HEART/CVS: regular rate & rhythm and no murmurs; No lower extremity edema ABDOMEN:abdomen soft, non-tender and normal bowel sounds Musculoskeletal:no cyanosis of digits and no clubbing  PSYCH: alert & oriented x 3 with fluent speech NEURO: no  focal motor/sensory deficits SKIN:  no rashes or significant lesions  LABORATORY DATA:  I have reviewed the data as listed    Component Value Date/Time   NA 137 12/21/2015 0419   NA 142 06/04/2014 1443   K 3.3 (L) 12/21/2015 0419   K 4.1 06/04/2014 1443   CL 105 12/21/2015 0419   CL 108 (H) 06/04/2014 1443   CO2 27 12/21/2015 0419   CO2 26 06/04/2014 1443   GLUCOSE 156 (H) 12/21/2015 0419   GLUCOSE 99 06/04/2014 1443   BUN 24 (H) 12/21/2015 0419   BUN 18 06/04/2014 1443   CREATININE 1.05 (H) 12/21/2015 0419   CREATININE 0.92 06/04/2014 1443   CALCIUM 8.8 (L) 12/21/2015 0419   CALCIUM 9.0 06/04/2014 1443   PROT 6.8 12/20/2015 1757   PROT 6.8 06/04/2014 1443   ALBUMIN 4.1 12/20/2015 1757   ALBUMIN 3.4 06/04/2014 1443   AST 28 12/20/2015 1757   AST 25 06/04/2014 1443   ALT 19 12/20/2015 1757   ALT 23 06/04/2014 1443   ALKPHOS 51 12/20/2015 1757   ALKPHOS 52 06/04/2014 1443   BILITOT 0.8 12/20/2015 1757   BILITOT 0.3 06/04/2014 1443   GFRNONAA 51 (L) 12/21/2015 0419   GFRNONAA >60 06/04/2014 1443   GFRNONAA 51 (L) 12/11/2013 1412   GFRAA 59 (L) 12/21/2015 0419   GFRAA >60 06/04/2014 1443   GFRAA 59 (L) 12/11/2013 1412    No results found for: SPEP, UPEP  Lab Results  Component Value Date   WBC 9.2 12/21/2015   NEUTROABS 4.0 12/20/2015   HGB 15.6 08/01/2016   HCT 45.9 08/01/2016   MCV 91.7 12/21/2015   PLT 161 12/21/2015      Chemistry      Component Value Date/Time   NA 137 12/21/2015 0419   NA 142 06/04/2014 1443   K 3.3 (L) 12/21/2015 0419   K 4.1 06/04/2014 1443   CL 105 12/21/2015 0419   CL 108 (H) 06/04/2014 1443   CO2 27 12/21/2015 0419   CO2 26 06/04/2014 1443   BUN 24 (H) 12/21/2015 0419   BUN 18 06/04/2014 1443   CREATININE 1.05 (H) 12/21/2015 0419   CREATININE 0.92 06/04/2014 1443      Component Value Date/Time   CALCIUM 8.8 (L) 12/21/2015 0419   CALCIUM 9.0 06/04/2014 1443   ALKPHOS 51 12/20/2015 1757   ALKPHOS 52 06/04/2014 1443    AST 28 12/20/2015 1757   AST 25 06/04/2014 1443   ALT 19 12/20/2015 1757   ALT 23 06/04/2014 1443   BILITOT 0.8 12/20/2015 1757   BILITOT 0.3 06/04/2014 1443          ASSESSMENT & PLAN:   Erythrocytosis #  ERYTHROCYTOSIS-  Unclear etiology. 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  Patient is asymptomatic except for elevated blood pressure. Proceed with phlebotomy today as hematocrit is 45.9.    # Elevated blood pressure- 510C systolic; on multiple medications as per PCP. Patient is awaiting evaluation with nephrology next week. I do not think patient is mildly erythrocytosis is the cause of her elevated blood pressure. She seems to be relieved after hearing this.    #H&H in 6 weeks is planned; follow-up with me is planned in July.      ;Cammie Sickle, MD 08/05/2016 6:22 PM

## 2016-08-02 ENCOUNTER — Ambulatory Visit
Admission: RE | Admit: 2016-08-02 | Discharge: 2016-08-02 | Disposition: A | Discharge status: Home / Self Care | Payer: Medicare Other | Source: Ambulatory Visit | Attending: Internal Medicine | Admitting: Internal Medicine

## 2016-08-02 DIAGNOSIS — R928 Other abnormal and inconclusive findings on diagnostic imaging of breast: Secondary | ICD-10-CM | POA: Insufficient documentation

## 2016-08-02 DIAGNOSIS — Z1231 Encounter for screening mammogram for malignant neoplasm of breast: Secondary | ICD-10-CM | POA: Insufficient documentation

## 2016-08-04 ENCOUNTER — Other Ambulatory Visit: Payer: Self-pay | Admitting: Internal Medicine

## 2016-08-04 DIAGNOSIS — N6489 Other specified disorders of breast: Secondary | ICD-10-CM

## 2016-08-04 DIAGNOSIS — R928 Other abnormal and inconclusive findings on diagnostic imaging of breast: Secondary | ICD-10-CM

## 2016-08-18 ENCOUNTER — Ambulatory Visit
Admission: RE | Admit: 2016-08-18 | Discharge: 2016-08-18 | Disposition: A | Discharge status: Home / Self Care | Payer: Medicare Other | Source: Ambulatory Visit | Attending: Internal Medicine | Admitting: Internal Medicine

## 2016-08-18 DIAGNOSIS — N6489 Other specified disorders of breast: Secondary | ICD-10-CM

## 2016-08-18 DIAGNOSIS — R928 Other abnormal and inconclusive findings on diagnostic imaging of breast: Secondary | ICD-10-CM | POA: Diagnosis present

## 2016-09-12 ENCOUNTER — Inpatient Hospital Stay: Payer: Medicare Other

## 2016-09-12 ENCOUNTER — Inpatient Hospital Stay: Payer: Medicare Other | Attending: Internal Medicine

## 2016-09-12 DIAGNOSIS — D751 Secondary polycythemia: Secondary | ICD-10-CM

## 2016-09-12 LAB — HEMOGLOBIN: HEMOGLOBIN: 14.8 g/dL (ref 12.0–16.0)

## 2016-09-12 LAB — HEMATOCRIT: HCT: 43.4 % (ref 35.0–47.0)

## 2016-10-24 ENCOUNTER — Inpatient Hospital Stay: Payer: Medicare Other | Attending: Internal Medicine

## 2016-10-24 ENCOUNTER — Inpatient Hospital Stay: Payer: Medicare Other

## 2016-10-24 DIAGNOSIS — D751 Secondary polycythemia: Secondary | ICD-10-CM

## 2016-10-24 LAB — HEMOGLOBIN: HEMOGLOBIN: 14.7 g/dL (ref 12.0–16.0)

## 2016-10-24 LAB — HEMATOCRIT: HCT: 42.5 % (ref 35.0–47.0)

## 2016-12-05 ENCOUNTER — Inpatient Hospital Stay: Payer: Medicare Other

## 2016-12-05 ENCOUNTER — Inpatient Hospital Stay: Payer: Medicare Other | Attending: Internal Medicine | Admitting: Internal Medicine

## 2016-12-05 VITALS — BP 133/76 | HR 58 | Temp 97.6°F | Resp 20 | Ht 62.0 in | Wt 144.0 lb

## 2016-12-05 DIAGNOSIS — Z9011 Acquired absence of right breast and nipple: Secondary | ICD-10-CM | POA: Diagnosis not present

## 2016-12-05 DIAGNOSIS — M7989 Other specified soft tissue disorders: Secondary | ICD-10-CM | POA: Diagnosis not present

## 2016-12-05 DIAGNOSIS — Z853 Personal history of malignant neoplasm of breast: Secondary | ICD-10-CM | POA: Diagnosis not present

## 2016-12-05 DIAGNOSIS — D751 Secondary polycythemia: Secondary | ICD-10-CM

## 2016-12-05 DIAGNOSIS — Z7982 Long term (current) use of aspirin: Secondary | ICD-10-CM | POA: Insufficient documentation

## 2016-12-05 DIAGNOSIS — Z79899 Other long term (current) drug therapy: Secondary | ICD-10-CM | POA: Diagnosis not present

## 2016-12-05 DIAGNOSIS — I10 Essential (primary) hypertension: Secondary | ICD-10-CM | POA: Diagnosis not present

## 2016-12-05 LAB — HEMOGLOBIN: Hemoglobin: 15.1 g/dL (ref 12.0–16.0)

## 2016-12-05 LAB — HEMATOCRIT: HEMATOCRIT: 44.4 % (ref 35.0–47.0)

## 2016-12-05 NOTE — Progress Notes (Signed)
Patient here for follow-up for erthrocytosis. Patient would like to discuss the goals of her care today with md. States that she was recently placed on 5 different BP medications. She has "not had to have a therapeutic phlebotomy the last two times. I would like to know if this is due to starting new bp medications."

## 2016-12-05 NOTE — Progress Notes (Signed)
[place] OFFICE PROGRESS NOTE  Patient Care Team: Idelle Crouch, MD as PCP - General (Internal Medicine)   SUMMARY OF ONCOLOGIC HISTORY:  # April 2015- ERTHROCYTOSIS ; JAK-2/Exon- 12NEG; Epo-N; June 2017-  Phlebotomy every 3 months  # 1999-RIGHT BREAST- Mastec & ALND [[place]] & chemo; Tam x 5 years   INTERVAL HISTORY:  A very pleasant 76 year old female patient with above history of erythrocytosis since  April 2015 is here for follow-up/ Phlebotomy.   Patient overall doing well. Finally blood pressures are better controlled on polypharmacy. She has not needed any phlebotomy for the last many months. She has been very concerned about her elevated blood pressure and feeling poorly that she canceled her trip to [place].  She has mild swelling in the legs- possibly attributed to amlodipine.  REVIEW OF SYSTEMS:  A complete 10 point review of system is done which is negative except mentioned above/history of present illness.   PAST MEDICAL HISTORY :  Past Medical History:  Diagnosis Date  . Breast cancer ([place]) 1999   right breast ca  . Erythrocytosis 09/30/2014  . Hypertension     PAST SURGICAL HISTORY :   Past Surgical History:  Procedure Laterality Date  . MASTECTOMY Right 1999  . rotator cuff      FAMILY HISTORY :   Family History  Problem Relation Age of Onset  . Breast cancer Mother 39    SOCIAL HISTORY:   Social History  Substance Use Topics  . Smoking status: Never Smoker  . Smokeless tobacco: Never Used  . Alcohol use 0.6 oz/week    1 Glasses of wine per week     Comment: BID prn    ALLERGIES:  has No Known Allergies.  MEDICATIONS:  Current Outpatient Prescriptions  Medication Sig Dispense Refill  . amLODipine (NORVASC) 2.5 MG tablet Take 1 tablet by mouth daily.    Marland Kitchen aspirin 325 MG tablet Take 325 mg by mouth daily.    . Calcium Carbonate-Vitamin D 600-400 MG-UNIT per tablet Take 1 tablet by mouth 2 (two) times daily.     .  cloNIDine (CATAPRES) 0.2 MG tablet Take 0.2 mg by mouth 3 (three) times daily.    . Coenzyme Q10 (CO Q-10) 100 MG CAPS Take 100 mg by mouth every morning.     . hydrALAZINE (APRESOLINE) 50 MG tablet Take 50 mg by mouth 3 (three) times daily.    Marland Kitchen lisinopril (PRINIVIL,ZESTRIL) 10 MG tablet Take 10 mg by mouth 3 (three) times daily.     Marland Kitchen MAGNESIUM GLYCINATE PLUS PO Take 200 mg by mouth every evening.     . Multiple Vitamin (MULTI-VITAMINS) TABS Take 1 tablet by mouth 2 (two) times daily.     . Omega-3 Fatty Acids (FISH OIL) 1000 MG CAPS Take 1,000 mg by mouth 2 (two) times daily.     . propranolol ER (INDERAL LA) 80 MG 24 hr capsule Take 80 mg by mouth every morning.    . riboflavin (VITAMIN B-2) 100 MG TABS tablet Take 300 mg by mouth 2 (two) times daily.     . TURMERIC PO Take 1 tablet by mouth 2 (two) times daily.     No current facility-administered medications for this visit.     PHYSICAL EXAMINATION: ECOG PERFORMANCE STATUS: 0 - Asymptomatic  BP 133/76 (BP Location: Left Arm, Patient Position: Sitting)   Pulse (!) 58   Temp 97.6 F (36.4 C) (Tympanic)   Resp 20   Ht 5\' 2"  (  1.575 m)   Wt 144 lb (65.3 kg)   BMI 26.34 kg/m   Filed Weights   12/05/16 1419  Weight: 144 lb (65.3 kg)    GENERAL: Well-nourished well-developed; Alert, no distress and comfortable.  Alone.  EYES: no pallor or icterus OROPHARYNX: no thrush or ulceration; good dentition  NECK: supple, no masses felt LYMPH:  no palpable lymphadenopathy in the cervical, axillary or inguinal regions LUNGS: clear to auscultation and  No wheeze or crackles HEART/CVS: regular rate & rhythm and no murmurs; mild 1+ Bil lower extremity edema ABDOMEN:abdomen soft, non-tender and normal bowel sounds Musculoskeletal:no cyanosis of digits and no clubbing  PSYCH: alert & oriented x 3 with fluent speech NEURO: no focal motor/sensory deficits SKIN:  no rashes or significant lesions  LABORATORY DATA:  I have reviewed the data as  listed    Component Value Date/Time   NA 137 12/21/2015 0419   NA 142 06/04/2014 1443   K 3.3 (L) 12/21/2015 0419   K 4.1 06/04/2014 1443   CL 105 12/21/2015 0419   CL 108 (H) 06/04/2014 1443   CO2 27 12/21/2015 0419   CO2 26 06/04/2014 1443   GLUCOSE 156 (H) 12/21/2015 0419   GLUCOSE 99 06/04/2014 1443   BUN 24 (H) 12/21/2015 0419   BUN 18 06/04/2014 1443   CREATININE 1.05 (H) 12/21/2015 0419   CREATININE 0.92 06/04/2014 1443   CALCIUM 8.8 (L) 12/21/2015 0419   CALCIUM 9.0 06/04/2014 1443   PROT 6.8 12/20/2015 1757   PROT 6.8 06/04/2014 1443   ALBUMIN 4.1 12/20/2015 1757   ALBUMIN 3.4 06/04/2014 1443   AST 28 12/20/2015 1757   AST 25 06/04/2014 1443   ALT 19 12/20/2015 1757   ALT 23 06/04/2014 1443   ALKPHOS 51 12/20/2015 1757   ALKPHOS 52 06/04/2014 1443   BILITOT 0.8 12/20/2015 1757   BILITOT 0.3 06/04/2014 1443   GFRNONAA 51 (L) 12/21/2015 0419   GFRNONAA >60 06/04/2014 1443   GFRNONAA 51 (L) 12/11/2013 1412   GFRAA 59 (L) 12/21/2015 0419   GFRAA >60 06/04/2014 1443   GFRAA 59 (L) 12/11/2013 1412    No results found for: SPEP, UPEP  Lab Results  Component Value Date   WBC 9.2 12/21/2015   NEUTROABS 4.0 12/20/2015   HGB 15.1 12/05/2016   HCT 44.4 12/05/2016   MCV 91.7 12/21/2015   PLT 161 12/21/2015      Chemistry      Component Value Date/Time   NA 137 12/21/2015 0419   NA 142 06/04/2014 1443   K 3.3 (L) 12/21/2015 0419   K 4.1 06/04/2014 1443   CL 105 12/21/2015 0419   CL 108 (H) 06/04/2014 1443   CO2 27 12/21/2015 0419   CO2 26 06/04/2014 1443   BUN 24 (H) 12/21/2015 0419   BUN 18 06/04/2014 1443   CREATININE 1.05 (H) 12/21/2015 0419   CREATININE 0.92 06/04/2014 1443      Component Value Date/Time   CALCIUM 8.8 (L) 12/21/2015 0419   CALCIUM 9.0 06/04/2014 1443   ALKPHOS 51 12/20/2015 1757   ALKPHOS 52 06/04/2014 1443   AST 28 12/20/2015 1757   AST 25 06/04/2014 1443   ALT 19 12/20/2015 1757   ALT 23 06/04/2014 1443   BILITOT 0.8  12/20/2015 1757   BILITOT 0.3 06/04/2014 1443          ASSESSMENT & PLAN:   Erythrocytosis # ERYTHROCYTOSIS-  Unclear etiology. 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  Patient  is asymptomatic except for elevated blood pressure [see discussion below].HOLD  phlebotomy today as hematocrit is 44.   # Elevated blood pressure- 170 s; finally improved- on polypharmacy [Dr.Lateef/Dr.Spark]  # H&H/possible phlebotomy Q 6 w; follow up with me in 6 months.      Cammie Sickle, MD 12/05/2016 2:45 PM

## 2016-12-05 NOTE — Assessment & Plan Note (Addendum)
#   ERYTHROCYTOSIS-  Unclear etiology. 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  Patient is asymptomatic except for elevated blood pressure [see discussion below].HOLD  phlebotomy today as hematocrit is 44.   # Elevated blood pressure- 170 s; finally improved- on polypharmacy [Dr.Lateef/Dr.Spark]  # H&H/possible phlebotomy Q 6 w; follow up with me in 6 months.

## 2016-12-19 ENCOUNTER — Other Ambulatory Visit: Payer: Medicare Other

## 2016-12-19 ENCOUNTER — Ambulatory Visit: Payer: Medicare Other | Admitting: Internal Medicine

## 2017-01-16 ENCOUNTER — Inpatient Hospital Stay: Payer: Medicare Other | Attending: Internal Medicine

## 2017-01-16 DIAGNOSIS — D751 Secondary polycythemia: Secondary | ICD-10-CM | POA: Insufficient documentation

## 2017-01-16 LAB — HEMOGLOBIN: Hemoglobin: 15.2 g/dL (ref 12.0–16.0)

## 2017-01-16 LAB — HEMATOCRIT: HCT: 44.4 % (ref 35.0–47.0)

## 2017-02-27 ENCOUNTER — Inpatient Hospital Stay: Payer: Medicare Other

## 2017-02-27 ENCOUNTER — Inpatient Hospital Stay: Payer: Medicare Other | Attending: Internal Medicine

## 2017-02-27 VITALS — BP 98/60 | HR 50 | Temp 97.6°F | Resp 20

## 2017-02-27 DIAGNOSIS — D751 Secondary polycythemia: Secondary | ICD-10-CM

## 2017-02-27 LAB — HEMOGLOBIN: HEMOGLOBIN: 15.4 g/dL (ref 12.0–16.0)

## 2017-02-27 LAB — HEMATOCRIT: HEMATOCRIT: 46.1 % (ref 35.0–47.0)

## 2017-04-10 ENCOUNTER — Inpatient Hospital Stay: Payer: Medicare Other | Attending: Internal Medicine

## 2017-04-10 ENCOUNTER — Inpatient Hospital Stay: Payer: Medicare Other

## 2017-04-10 DIAGNOSIS — D751 Secondary polycythemia: Secondary | ICD-10-CM | POA: Diagnosis not present

## 2017-04-10 LAB — HEMOGLOBIN: Hemoglobin: 15.2 g/dL (ref 12.0–16.0)

## 2017-04-10 LAB — HEMATOCRIT: HCT: 45 % (ref 35.0–47.0)

## 2017-06-05 ENCOUNTER — Encounter: Payer: Self-pay | Admitting: Internal Medicine

## 2017-06-05 ENCOUNTER — Inpatient Hospital Stay: Payer: Medicare Other

## 2017-06-05 ENCOUNTER — Inpatient Hospital Stay: Payer: Medicare Other | Attending: Internal Medicine | Admitting: Internal Medicine

## 2017-06-05 VITALS — BP 115/70 | HR 61 | Temp 97.9°F | Wt 146.8 lb

## 2017-06-05 DIAGNOSIS — I10 Essential (primary) hypertension: Secondary | ICD-10-CM | POA: Diagnosis not present

## 2017-06-05 DIAGNOSIS — Z79899 Other long term (current) drug therapy: Secondary | ICD-10-CM

## 2017-06-05 DIAGNOSIS — D751 Secondary polycythemia: Secondary | ICD-10-CM | POA: Insufficient documentation

## 2017-06-05 DIAGNOSIS — Z853 Personal history of malignant neoplasm of breast: Secondary | ICD-10-CM

## 2017-06-05 DIAGNOSIS — Z7982 Long term (current) use of aspirin: Secondary | ICD-10-CM | POA: Insufficient documentation

## 2017-06-05 DIAGNOSIS — Z9011 Acquired absence of right breast and nipple: Secondary | ICD-10-CM | POA: Diagnosis not present

## 2017-06-05 LAB — HEMATOCRIT: HCT: 45.7 % (ref 35.0–47.0)

## 2017-06-05 LAB — HEMOGLOBIN: Hemoglobin: 15.1 g/dL (ref 12.0–16.0)

## 2017-06-05 NOTE — Progress Notes (Signed)
[facility] OFFICE PROGRESS NOTE  Patient Care Team: Idelle Crouch, MD as PCP - General (Internal Medicine)   SUMMARY OF ONCOLOGIC HISTORY:  # April 2015- ERTHROCYTOSIS ; JAK-2/Exon- 12NEG; Epo-N; June 2017-  Phlebotomy every 3 months  # 1999-RIGHT BREAST- Mastec & ALND [[place]] & chemo; Tam x 5 years   INTERVAL HISTORY:  A very pleasant 77 year old female patient with above history of erythrocytosis since  April 2015 is here for follow-up/ Phlebotomy.   Patient overall doing well. Finally blood pressures are better controlled on polypharmacy. She has not needed any phlebotomy for the last many months.  Denies any headaches.  She is again interested in making travel plans to [place] in the next few months.  REVIEW OF SYSTEMS:  A complete 10 point review of system is done which is negative except mentioned above/history of present illness.   PAST MEDICAL HISTORY :  Past Medical History:  Diagnosis Date  . Breast cancer ([place]) 1999   right breast ca  . Erythrocytosis 09/30/2014  . Hypertension     PAST SURGICAL HISTORY :   Past Surgical History:  Procedure Laterality Date  . MASTECTOMY Right 1999  . rotator cuff      FAMILY HISTORY :   Family History  Problem Relation Age of Onset  . Breast cancer Mother 49    SOCIAL HISTORY:   Social History   Tobacco Use  . Smoking status: Never Smoker  . Smokeless tobacco: Never Used  Substance Use Topics  . Alcohol use: Yes    Alcohol/week: 0.6 oz    Types: 1 Glasses of wine per week    Comment: BID prn  . Drug use: Not on file    ALLERGIES:  has No Known Allergies.  MEDICATIONS:  Current Outpatient Medications  Medication Sig Dispense Refill  . amLODipine (NORVASC) 2.5 MG tablet Take 1 tablet by mouth daily.    Marland Kitchen aspirin 325 MG tablet Take 325 mg by mouth daily.    . Calcium Carbonate-Vitamin D 600-400 MG-UNIT per tablet Take 1 tablet by mouth 2 (two) times daily.     . cloNIDine (CATAPRES) 0.2  MG tablet Take 0.2 mg by mouth 3 (three) times daily.    . Coenzyme Q10 (CO Q-10) 100 MG CAPS Take 100 mg by mouth every morning.     . hydrALAZINE (APRESOLINE) 50 MG tablet Take 50 mg by mouth 3 (three) times daily.    Marland Kitchen lisinopril (PRINIVIL,ZESTRIL) 10 MG tablet Take 10 mg by mouth 3 (three) times daily.     Marland Kitchen MAGNESIUM GLYCINATE PLUS PO Take 200 mg by mouth every evening.     . Multiple Vitamin (MULTI-VITAMINS) TABS Take 1 tablet by mouth 2 (two) times daily.     . Omega-3 Fatty Acids (FISH OIL) 1000 MG CAPS Take 1,000 mg by mouth 2 (two) times daily.     . propranolol ER (INDERAL LA) 80 MG 24 hr capsule Take 80 mg by mouth every morning.    . riboflavin (VITAMIN B-2) 100 MG TABS tablet Take 300 mg by mouth 2 (two) times daily.     . TURMERIC PO Take 1 tablet by mouth 2 (two) times daily.     No current facility-administered medications for this visit.     PHYSICAL EXAMINATION: ECOG PERFORMANCE STATUS: 0 - Asymptomatic  BP 115/70 (BP Location: Left Arm, Patient Position: Sitting)   Pulse 61   Temp 97.9 F (36.6 C) (Oral)   Wt 146 lb 12.8  oz (66.6 kg)   SpO2 95%   BMI 26.85 kg/m   Filed Weights   06/05/17 1431  Weight: 146 lb 12.8 oz (66.6 kg)    GENERAL: Well-nourished well-developed; Alert, no distress and comfortable.  Alone.  EYES: no pallor or icterus OROPHARYNX: no thrush or ulceration; good dentition  NECK: supple, no masses felt LYMPH:  no palpable lymphadenopathy in the cervical, axillary or inguinal regions LUNGS: clear to auscultation and  No wheeze or crackles HEART/CVS: regular rate & rhythm and no murmurs; mild 1+ Bil lower extremity edema ABDOMEN:abdomen soft, non-tender and normal bowel sounds Musculoskeletal:no cyanosis of digits and no clubbing  PSYCH: alert & oriented x 3 with fluent speech NEURO: no focal motor/sensory deficits SKIN:  no rashes or significant lesions  LABORATORY DATA:  I have reviewed the data as listed    Component Value Date/Time    NA 137 12/21/2015 0419   NA 142 06/04/2014 1443   K 3.3 (L) 12/21/2015 0419   K 4.1 06/04/2014 1443   CL 105 12/21/2015 0419   CL 108 (H) 06/04/2014 1443   CO2 27 12/21/2015 0419   CO2 26 06/04/2014 1443   GLUCOSE 156 (H) 12/21/2015 0419   GLUCOSE 99 06/04/2014 1443   BUN 24 (H) 12/21/2015 0419   BUN 18 06/04/2014 1443   CREATININE 1.05 (H) 12/21/2015 0419   CREATININE 0.92 06/04/2014 1443   CALCIUM 8.8 (L) 12/21/2015 0419   CALCIUM 9.0 06/04/2014 1443   PROT 6.8 12/20/2015 1757   PROT 6.8 06/04/2014 1443   ALBUMIN 4.1 12/20/2015 1757   ALBUMIN 3.4 06/04/2014 1443   AST 28 12/20/2015 1757   AST 25 06/04/2014 1443   ALT 19 12/20/2015 1757   ALT 23 06/04/2014 1443   ALKPHOS 51 12/20/2015 1757   ALKPHOS 52 06/04/2014 1443   BILITOT 0.8 12/20/2015 1757   BILITOT 0.3 06/04/2014 1443   GFRNONAA 51 (L) 12/21/2015 0419   GFRNONAA >60 06/04/2014 1443   GFRNONAA 51 (L) 12/11/2013 1412   GFRAA 59 (L) 12/21/2015 0419   GFRAA >60 06/04/2014 1443   GFRAA 59 (L) 12/11/2013 1412    No results found for: SPEP, UPEP  Lab Results  Component Value Date   WBC 9.2 12/21/2015   NEUTROABS 4.0 12/20/2015   HGB 15.1 06/05/2017   HCT 45.7 06/05/2017   MCV 91.7 12/21/2015   PLT 161 12/21/2015      Chemistry      Component Value Date/Time   NA 137 12/21/2015 0419   NA 142 06/04/2014 1443   K 3.3 (L) 12/21/2015 0419   K 4.1 06/04/2014 1443   CL 105 12/21/2015 0419   CL 108 (H) 06/04/2014 1443   CO2 27 12/21/2015 0419   CO2 26 06/04/2014 1443   BUN 24 (H) 12/21/2015 0419   BUN 18 06/04/2014 1443   CREATININE 1.05 (H) 12/21/2015 0419   CREATININE 0.92 06/04/2014 1443      Component Value Date/Time   CALCIUM 8.8 (L) 12/21/2015 0419   CALCIUM 9.0 06/04/2014 1443   ALKPHOS 51 12/20/2015 1757   ALKPHOS 52 06/04/2014 1443   AST 28 12/20/2015 1757   AST 25 06/04/2014 1443   ALT 19 12/20/2015 1757   ALT 23 06/04/2014 1443   BILITOT 0.8 12/20/2015 1757   BILITOT 0.3 06/04/2014  1443          ASSESSMENT & PLAN:   Erythrocytosis # ERYTHROCYTOSIS-  Unclear etiology. 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  Patient  is asymptomatic except for elevated blood pressure [see discussion below]. HOLD  phlebotomy today as hematocrit is 45.   # Elevated blood pressure- 115/70 s; finally improved- on polypharmacy [Dr.Lateef/Dr.Spark]  # H&H/possible phlebotomy Q 3 months; follow up with me in 6 months; possible phlebotomy. [travel plans]    ;Cammie Sickle, MD 06/05/2017 3:12 PM

## 2017-06-05 NOTE — Assessment & Plan Note (Addendum)
#   ERYTHROCYTOSIS-  Unclear etiology. 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  Patient is asymptomatic. HOLD  phlebotomy today as hematocrit is 45.   # Elevated blood pressure- 115/70 s; finally improved- on polypharmacy [Dr.Lateef/Dr.Spark]  # H&H/possible phlebotomy Q 3 months; follow up with me in 6 months; possible phlebotomy. [travel plans]

## 2017-07-13 ENCOUNTER — Telehealth: Payer: Self-pay | Admitting: *Deleted

## 2017-07-13 NOTE — Telephone Encounter (Signed)
Patient came to cancer center today and requested to speak to Dr. Rogue Bussing or his nurse. Pt had labs drawn this week at [facility]. hct 49.9. Pt expressed that she is highly anxious and would like a phlebotomy done today or tomorrow. I reviewed the patient's labs with Lauren, NP.  orders obtained to have Phlebotomy schedule. Pt given an apt for tomorrow at 1:30pm. Patient states that she will keep her apts on April 29'th, but she asked that the apts in July be moved up. She would like to come every 6-7 weeks for lab/possible phlebotomy. She voices concerns that the apt in July is "too far out." apt moved per patient request.

## 2017-07-14 ENCOUNTER — Inpatient Hospital Stay: Payer: Medicare Other | Attending: Internal Medicine

## 2017-07-14 VITALS — BP 116/73 | HR 51 | Temp 97.8°F | Resp 20

## 2017-07-14 DIAGNOSIS — I10 Essential (primary) hypertension: Secondary | ICD-10-CM | POA: Insufficient documentation

## 2017-07-14 DIAGNOSIS — Z79899 Other long term (current) drug therapy: Secondary | ICD-10-CM | POA: Diagnosis not present

## 2017-07-14 DIAGNOSIS — Z9011 Acquired absence of right breast and nipple: Secondary | ICD-10-CM | POA: Insufficient documentation

## 2017-07-14 DIAGNOSIS — Z853 Personal history of malignant neoplasm of breast: Secondary | ICD-10-CM | POA: Insufficient documentation

## 2017-07-14 DIAGNOSIS — D751 Secondary polycythemia: Secondary | ICD-10-CM | POA: Diagnosis not present

## 2017-07-14 DIAGNOSIS — Z7982 Long term (current) use of aspirin: Secondary | ICD-10-CM | POA: Insufficient documentation

## 2017-07-17 ENCOUNTER — Other Ambulatory Visit: Payer: Self-pay | Admitting: Internal Medicine

## 2017-07-17 DIAGNOSIS — Z1231 Encounter for screening mammogram for malignant neoplasm of breast: Secondary | ICD-10-CM

## 2017-08-03 ENCOUNTER — Ambulatory Visit
Admission: RE | Admit: 2017-08-03 | Discharge: 2017-08-03 | Disposition: A | Discharge status: Home / Self Care | Payer: Medicare Other | Source: Ambulatory Visit | Attending: Internal Medicine | Admitting: Internal Medicine

## 2017-08-03 DIAGNOSIS — Z1231 Encounter for screening mammogram for malignant neoplasm of breast: Secondary | ICD-10-CM | POA: Insufficient documentation

## 2017-08-03 DIAGNOSIS — Z9011 Acquired absence of right breast and nipple: Secondary | ICD-10-CM | POA: Diagnosis not present

## 2017-09-04 ENCOUNTER — Inpatient Hospital Stay: Payer: Medicare Other

## 2017-09-04 ENCOUNTER — Inpatient Hospital Stay: Payer: Medicare Other | Attending: Internal Medicine

## 2017-09-04 DIAGNOSIS — Z9011 Acquired absence of right breast and nipple: Secondary | ICD-10-CM | POA: Insufficient documentation

## 2017-09-04 DIAGNOSIS — D751 Secondary polycythemia: Secondary | ICD-10-CM | POA: Diagnosis present

## 2017-09-04 DIAGNOSIS — Z79899 Other long term (current) drug therapy: Secondary | ICD-10-CM | POA: Diagnosis not present

## 2017-09-04 DIAGNOSIS — Z7982 Long term (current) use of aspirin: Secondary | ICD-10-CM | POA: Diagnosis not present

## 2017-09-04 DIAGNOSIS — I10 Essential (primary) hypertension: Secondary | ICD-10-CM | POA: Diagnosis not present

## 2017-09-04 DIAGNOSIS — Z853 Personal history of malignant neoplasm of breast: Secondary | ICD-10-CM | POA: Diagnosis not present

## 2017-09-04 LAB — HEMOGLOBIN: Hemoglobin: 15.1 g/dL (ref 12.0–16.0)

## 2017-09-04 LAB — HEMATOCRIT: HCT: 44.4 % (ref 35.0–47.0)

## 2017-10-20 ENCOUNTER — Other Ambulatory Visit: Payer: Self-pay

## 2017-10-20 ENCOUNTER — Encounter: Payer: Self-pay | Admitting: Nurse Practitioner

## 2017-10-20 ENCOUNTER — Inpatient Hospital Stay: Payer: Medicare Other

## 2017-10-20 ENCOUNTER — Inpatient Hospital Stay: Payer: Medicare Other | Attending: Internal Medicine

## 2017-10-20 ENCOUNTER — Inpatient Hospital Stay (HOSPITAL_BASED_OUTPATIENT_CLINIC_OR_DEPARTMENT_OTHER): Payer: Medicare Other | Admitting: Nurse Practitioner

## 2017-10-20 VITALS — BP 113/68 | HR 54 | Temp 98.2°F | Resp 12 | Ht 62.0 in | Wt 144.2 lb

## 2017-10-20 DIAGNOSIS — D751 Secondary polycythemia: Secondary | ICD-10-CM | POA: Diagnosis not present

## 2017-10-20 DIAGNOSIS — I10 Essential (primary) hypertension: Secondary | ICD-10-CM | POA: Diagnosis not present

## 2017-10-20 LAB — HEMATOCRIT: HEMATOCRIT: 45.9 % (ref 35.0–47.0)

## 2017-10-20 LAB — HEMOGLOBIN: HEMOGLOBIN: 15.7 g/dL (ref 12.0–16.0)

## 2017-10-20 NOTE — Progress Notes (Signed)
Patient here for follow up. She has had her blood pressure medications adjusted.

## 2017-10-20 NOTE — Progress Notes (Signed)
[facility] OFFICE PROGRESS NOTE  Patient Care Team: Idelle Crouch, MD as PCP - General (Internal Medicine) Cammie Sickle, MD as Medical Oncologist (Hematology and Oncology)   SUMMARY OF ONCOLOGIC HISTORY:  # April 2015- ERTHROCYTOSIS ; JAK-2/Exon- 12NEG; Epo-N; June 2017-  Phlebotomy every 3 months  # 1999-RIGHT BREAST- Mastec & ALND [[place]] & chemo; Tam x 5 years   INTERVAL HISTORY: Very pleasant 77 year old female patient with above history of erythrocytosis since 08/2013 returns to clinic today for follow-up and consideration of therapeutic phlebotomy.  In the interim, she reports that blood pressure medications were adjusted and she has had good control.  In March, hematocrit was nearly 50, this caused her great anxiety and she came to clinic to request phlebotomy which was scheduled and performed.   Today, she continues to keep record of her hemoglobins and hematocrits and provides a diary of those today.  She states that it was her understanding that she would have therapeutic phlebotomy was greater than 45.  She expresses concern over goal hematocrit and says she is most comfortable 45.  She denies recent illnesses or  Hospitalizations. She denies headaches and has made several plans for travel.     REVIEW OF SYSTEMS:   General:  no complaints Skin: no complaints Eyes: no complaints HEENT: no complaints Breasts: no complaints Pulmonary: no complaints Cardiac: no complaints Gastrointestinal: no complaints Genitourinary/Sexual: no complaints Ob/Gyn: no complaints Musculoskeletal: no complaints Hematology: no complaints Neurologic/Psych: no complaints   PAST MEDICAL HISTORY :  Past Medical History:  Diagnosis Date  . Breast cancer ([place]) 1999   right breast ca  . Erythrocytosis 09/30/2014  . Hypertension     PAST SURGICAL HISTORY :   Past Surgical History:  Procedure Laterality Date  . MASTECTOMY Right 1999  . rotator cuff       FAMILY HISTORY :   Family History  Problem Relation Age of Onset  . Breast cancer Mother 53    SOCIAL HISTORY:   Social History   Tobacco Use  . Smoking status: Never Smoker  . Smokeless tobacco: Never Used  Substance Use Topics  . Alcohol use: Yes    Alcohol/week: 0.6 oz    Types: 1 Glasses of wine per week    Comment: BID prn  . Drug use: Not on file    ALLERGIES:  has No Known Allergies.  MEDICATIONS:  Current Outpatient Medications  Medication Sig Dispense Refill  . amLODipine (NORVASC) 2.5 MG tablet Take 1 tablet by mouth daily.    Marland Kitchen aspirin 325 MG tablet Take 325 mg by mouth daily.    . Calcium Carbonate-Vitamin D 600-400 MG-UNIT per tablet Take 1 tablet by mouth 2 (two) times daily.     . cloNIDine (CATAPRES) 0.2 MG tablet Take 1 tablet by mouth 3 (three) times daily.    . Coenzyme Q10 (CO Q-10) 100 MG CAPS Take 100 mg by mouth every morning.     . hydrALAZINE (APRESOLINE) 50 MG tablet Take 1 tablet by mouth 3 (three) times daily.    Marland Kitchen lisinopril (PRINIVIL,ZESTRIL) 10 MG tablet Take 10 mg by mouth 3 (three) times daily.     Marland Kitchen MAGNESIUM GLYCINATE PLUS PO Take 200 mg by mouth every evening.     . Multiple Vitamin (MULTI-VITAMINS) TABS Take 1 tablet by mouth 2 (two) times daily.     . Omega-3 Fatty Acids (FISH OIL) 1000 MG CAPS Take 1,000 mg by mouth 2 (two) times daily.     Marland Kitchen  propranolol ER (INDERAL LA) 80 MG 24 hr capsule Take 80 mg by mouth every morning.    . riboflavin (VITAMIN B-2) 100 MG TABS tablet Take 300 mg by mouth 2 (two) times daily.     . TURMERIC PO Take 1 tablet by mouth 2 (two) times daily.     No current facility-administered medications for this visit.     PHYSICAL EXAMINATION: ECOG PERFORMANCE STATUS: 0 - Asymptomatic  BP 113/68 (BP Location: Left Arm, Patient Position: Sitting)   Pulse (!) 54   Temp 98.2 F (36.8 C) (Tympanic)   Resp 12   Ht 5\' 2"  (1.575 m)   Wt 144 lb 3.2 oz (65.4 kg)   BMI 26.37 kg/m   Filed Weights   10/20/17  1338  Weight: 144 lb 3.2 oz (65.4 kg)    GENERAL: Well-nourished well-developed; Alert, no distress and comfortable. Alone.   EYES: no pallor or icterus OROPHARYNX: no thrush or ulceration NECK: supple; no lymph nodes felt LYMPH: no palpable lymphadenopathy in the axillary or inguinal regions LUNGS: Decreased breath sounds auscultation bilaterally. No wheeze or crackles HEART/CVS: regular rate & rhythm and no murmurs; No lower extremity edema ABDOMEN: abdomen soft, non-tender and normal bowel sounds. No hepatomegaly or splenomegaly.  Musculoskeletal: no cyanosis of digits and no clubbing  PSYCH: alert & oriented x 3 with fluent speech NEURO: no focal motor/sensory deficits SKIN: no rashes or significant lesions   LABORATORY DATA:  I have reviewed the data as listed    Component Value Date/Time   NA 137 12/21/2015 0419   NA 142 06/04/2014 1443   K 3.3 (L) 12/21/2015 0419   K 4.1 06/04/2014 1443   CL 105 12/21/2015 0419   CL 108 (H) 06/04/2014 1443   CO2 27 12/21/2015 0419   CO2 26 06/04/2014 1443   GLUCOSE 156 (H) 12/21/2015 0419   GLUCOSE 99 06/04/2014 1443   BUN 24 (H) 12/21/2015 0419   BUN 18 06/04/2014 1443   CREATININE 1.05 (H) 12/21/2015 0419   CREATININE 0.92 06/04/2014 1443   CALCIUM 8.8 (L) 12/21/2015 0419   CALCIUM 9.0 06/04/2014 1443   PROT 6.8 12/20/2015 1757   PROT 6.8 06/04/2014 1443   ALBUMIN 4.1 12/20/2015 1757   ALBUMIN 3.4 06/04/2014 1443   AST 28 12/20/2015 1757   AST 25 06/04/2014 1443   ALT 19 12/20/2015 1757   ALT 23 06/04/2014 1443   ALKPHOS 51 12/20/2015 1757   ALKPHOS 52 06/04/2014 1443   BILITOT 0.8 12/20/2015 1757   BILITOT 0.3 06/04/2014 1443   GFRNONAA 51 (L) 12/21/2015 0419   GFRNONAA >60 06/04/2014 1443   GFRNONAA 51 (L) 12/11/2013 1412   GFRAA 59 (L) 12/21/2015 0419   GFRAA >60 06/04/2014 1443   GFRAA 59 (L) 12/11/2013 1412    No results found for: SPEP, UPEP  Lab Results  Component Value Date   WBC 9.2 12/21/2015    NEUTROABS 4.0 12/20/2015   HGB 15.7 10/20/2017   HCT 45.9 10/20/2017   MCV 91.7 12/21/2015   PLT 161 12/21/2015      Chemistry      Component Value Date/Time   NA 137 12/21/2015 0419   NA 142 06/04/2014 1443   K 3.3 (L) 12/21/2015 0419   K 4.1 06/04/2014 1443   CL 105 12/21/2015 0419   CL 108 (H) 06/04/2014 1443   CO2 27 12/21/2015 0419   CO2 26 06/04/2014 1443   BUN 24 (H) 12/21/2015 0419   BUN 18 06/04/2014 1443  CREATININE 1.05 (H) 12/21/2015 0419   CREATININE 0.92 06/04/2014 1443      Component Value Date/Time   CALCIUM 8.8 (L) 12/21/2015 0419   CALCIUM 9.0 06/04/2014 1443   ALKPHOS 51 12/20/2015 1757   ALKPHOS 52 06/04/2014 1443   AST 28 12/20/2015 1757   AST 25 06/04/2014 1443   ALT 19 12/20/2015 1757   ALT 23 06/04/2014 1443   BILITOT 0.8 12/20/2015 1757   BILITOT 0.3 06/04/2014 1443         ASSESSMENT & PLAN:  Erythrocytosis [D75.1]  Erythrocytosis-unclear etiology.  Jak 2 mutation/exon 12 negative. Erythropoietin level normal. Patient continue to be asymptomatic but reports she feels best when hematocrit is < 45. In Dr. Aletha Halim absence, discussed with Dr. Tasia Catchings who recommends proceeding with phlebotomy today as hematocrit is 45.9. Discussed option of participating in phlebotomy clinic that includes donation of blood but patient is reluctant at this time due to scheduling and travel.   Hypertension- now well controlled.  Continue current regimen and follow-up with Dr. Holley Raring and Dr. Doy Hutching.   Proceed with phlebotomy today.  Return to clinic in 6 weeks for labs and possible phlebotomy again in 12 weeks for same.  In 18 weeks follow-up with Dr. Rogue Bussing for labs and possible phlebotomy.  A total of (25) minutes of face-to-face time was spent with this patient with greater than 50% of that time in counseling and care-coordination.  Beckey Rutter, DNP, AGNP-C [facility] (work cell) [phone] (office)  CC: Dr.  Rogue Bussing

## 2017-10-25 ENCOUNTER — Other Ambulatory Visit: Payer: Self-pay | Admitting: Internal Medicine

## 2017-10-25 DIAGNOSIS — R1310 Dysphagia, unspecified: Secondary | ICD-10-CM

## 2017-11-02 ENCOUNTER — Ambulatory Visit
Admission: RE | Admit: 2017-11-02 | Discharge: 2017-11-02 | Disposition: A | Discharge status: Home / Self Care | Payer: Medicare Other | Source: Ambulatory Visit | Attending: Internal Medicine | Admitting: Internal Medicine

## 2017-11-02 DIAGNOSIS — R1310 Dysphagia, unspecified: Secondary | ICD-10-CM | POA: Diagnosis present

## 2017-11-02 DIAGNOSIS — K219 Gastro-esophageal reflux disease without esophagitis: Secondary | ICD-10-CM | POA: Insufficient documentation

## 2017-11-03 ENCOUNTER — Ambulatory Visit: Payer: Medicare Other | Admitting: Internal Medicine

## 2017-11-03 ENCOUNTER — Other Ambulatory Visit: Payer: Medicare Other

## 2017-12-01 ENCOUNTER — Inpatient Hospital Stay: Payer: Medicare Other

## 2017-12-01 ENCOUNTER — Inpatient Hospital Stay: Payer: Medicare Other | Attending: Internal Medicine

## 2017-12-01 ENCOUNTER — Other Ambulatory Visit: Payer: Self-pay

## 2017-12-01 VITALS — BP 117/72 | HR 20 | Temp 98.6°F | Resp 20

## 2017-12-01 DIAGNOSIS — D751 Secondary polycythemia: Secondary | ICD-10-CM

## 2017-12-01 LAB — HEMATOCRIT: HEMATOCRIT: 46.7 % (ref 35.0–47.0)

## 2017-12-01 LAB — HEMOGLOBIN: Hemoglobin: 15.8 g/dL (ref 12.0–16.0)

## 2017-12-04 ENCOUNTER — Ambulatory Visit: Payer: Medicare Other | Admitting: Internal Medicine

## 2017-12-04 ENCOUNTER — Other Ambulatory Visit: Payer: Medicare Other

## 2018-01-11 ENCOUNTER — Inpatient Hospital Stay: Payer: Medicare Other | Attending: Internal Medicine

## 2018-01-11 ENCOUNTER — Inpatient Hospital Stay: Payer: Medicare Other

## 2018-01-11 ENCOUNTER — Other Ambulatory Visit: Payer: Self-pay

## 2018-01-11 DIAGNOSIS — D751 Secondary polycythemia: Secondary | ICD-10-CM

## 2018-01-11 LAB — HEMOGLOBIN: Hemoglobin: 15.4 g/dL (ref 12.0–16.0)

## 2018-01-11 LAB — HEMATOCRIT: HCT: 45.9 % (ref 35.0–47.0)

## 2018-02-22 ENCOUNTER — Encounter: Payer: Self-pay | Admitting: Internal Medicine

## 2018-02-22 ENCOUNTER — Inpatient Hospital Stay: Payer: Medicare Other | Attending: Internal Medicine

## 2018-02-22 ENCOUNTER — Other Ambulatory Visit: Payer: Self-pay

## 2018-02-22 ENCOUNTER — Inpatient Hospital Stay: Payer: Medicare Other

## 2018-02-22 ENCOUNTER — Inpatient Hospital Stay (HOSPITAL_BASED_OUTPATIENT_CLINIC_OR_DEPARTMENT_OTHER): Payer: Medicare Other | Admitting: Internal Medicine

## 2018-02-22 VITALS — BP 158/80 | HR 51 | Temp 97.3°F | Resp 16

## 2018-02-22 DIAGNOSIS — Z7982 Long term (current) use of aspirin: Secondary | ICD-10-CM | POA: Insufficient documentation

## 2018-02-22 DIAGNOSIS — D751 Secondary polycythemia: Secondary | ICD-10-CM

## 2018-02-22 DIAGNOSIS — Z853 Personal history of malignant neoplasm of breast: Secondary | ICD-10-CM | POA: Diagnosis not present

## 2018-02-22 DIAGNOSIS — Z9011 Acquired absence of right breast and nipple: Secondary | ICD-10-CM | POA: Diagnosis not present

## 2018-02-22 DIAGNOSIS — Z79899 Other long term (current) drug therapy: Secondary | ICD-10-CM | POA: Insufficient documentation

## 2018-02-22 DIAGNOSIS — I10 Essential (primary) hypertension: Secondary | ICD-10-CM | POA: Insufficient documentation

## 2018-02-22 LAB — HEMATOCRIT: HCT: 44.7 % (ref 36.0–46.0)

## 2018-02-22 LAB — HEMOGLOBIN: Hemoglobin: 14.6 g/dL (ref 12.0–15.0)

## 2018-02-22 NOTE — Progress Notes (Signed)
[facility] OFFICE PROGRESS NOTE  Patient Care Team: Idelle Crouch, MD as PCP - General (Internal Medicine) Cammie Sickle, MD as Medical Oncologist (Hematology and Oncology)   SUMMARY OF ONCOLOGIC HISTORY:  # April 2015- ERTHROCYTOSIS ; JAK-2/Exon- 12NEG; Epo-N; June 2017-  Phlebotomy every 3 months  # 1999-RIGHT BREAST- Mastec & ALND [[place]] & chemo; Tam x 5 years   INTERVAL HISTORY:  A very pleasant 77 year old female patient with above history of erythrocytosis since  April 2015 is here for follow-up/ Phlebotomy.  Patient has returned from a trip to [place].  The trip was uneventful.  States her blood pressure better control at home.  No headaches.  No swelling in the legs.  No new shortness of breath  Review of Systems  Constitutional: Negative for chills, diaphoresis, fever, malaise/fatigue and weight loss.  HENT: Negative for nosebleeds and sore throat.   Eyes: Negative for double vision.  Respiratory: Negative for cough, hemoptysis, sputum production, shortness of breath and wheezing.   Cardiovascular: Negative for chest pain, palpitations, orthopnea and leg swelling.  Gastrointestinal: Negative for abdominal pain, blood in stool, constipation, diarrhea, heartburn, melena, nausea and vomiting.  Genitourinary: Negative for dysuria, frequency and urgency.  Musculoskeletal: Negative for back pain and joint pain.  Skin: Negative.  Negative for itching and rash.  Neurological: Negative for dizziness, tingling, focal weakness, weakness and headaches.  Endo/Heme/Allergies: Does not bruise/bleed easily.  Psychiatric/Behavioral: Negative for depression. The patient is not nervous/anxious and does not have insomnia.      PAST MEDICAL HISTORY :  Past Medical History:  Diagnosis Date  . Breast cancer ([facility]) 1999   right breast ca  . Erythrocytosis 09/30/2014  . Hypertension     PAST SURGICAL HISTORY :   Past Surgical History:  Procedure  Laterality Date  . MASTECTOMY Right 1999  . rotator cuff      FAMILY HISTORY :   Family History  Problem Relation Age of Onset  . Breast cancer Mother 42    SOCIAL HISTORY:   Social History   Tobacco Use  . Smoking status: Never Smoker  . Smokeless tobacco: Never Used  Substance Use Topics  . Alcohol use: Yes    Alcohol/week: 1.0 standard drinks    Types: 1 Glasses of wine per week    Comment: BID prn  . Drug use: Not on file    ALLERGIES:  has No Known Allergies.  MEDICATIONS:  Current Outpatient Medications  Medication Sig Dispense Refill  . amLODipine (NORVASC) 2.5 MG tablet Take 1 tablet by mouth daily.    Marland Kitchen aspirin 325 MG tablet Take 81 mg by mouth daily.     . Calcium Carbonate-Vitamin D 600-400 MG-UNIT per tablet Take 1 tablet by mouth 2 (two) times daily.     . cloNIDine (CATAPRES) 0.2 MG tablet Take 1 tablet by mouth 3 (three) times daily.    . Coenzyme Q10 (CO Q-10) 100 MG CAPS Take 100 mg by mouth every morning.     . hydrALAZINE (APRESOLINE) 50 MG tablet Take 1 tablet by mouth 3 (three) times daily.    Marland Kitchen lisinopril (PRINIVIL,ZESTRIL) 10 MG tablet Take 10 mg by mouth 3 (three) times daily.     Marland Kitchen MAGNESIUM GLYCINATE PLUS PO Take 200 mg by mouth every evening.     . Multiple Vitamin (MULTI-VITAMINS) TABS Take 1 tablet by mouth 2 (two) times daily.     . Omega-3 Fatty Acids (FISH OIL) 1000 MG CAPS Take 1,000 mg  by mouth 2 (two) times daily.     . propranolol ER (INDERAL LA) 80 MG 24 hr capsule Take 80 mg by mouth every morning.    . riboflavin (VITAMIN B-2) 100 MG TABS tablet Take 300 mg by mouth 2 (two) times daily.     . TURMERIC PO Take 1 tablet by mouth 2 (two) times daily.     No current facility-administered medications for this visit.     PHYSICAL EXAMINATION: ECOG PERFORMANCE STATUS: 0 - Asymptomatic  BP (!) 158/80 (BP Location: Left Arm, Patient Position: Sitting)   Pulse (!) 51   Temp (!) 97.3 F (36.3 C) (Tympanic)   Resp 16   There were no  vitals filed for this visit.  Physical Exam  Constitutional: She is oriented to person, place, and time and well-developed, well-nourished, and in no distress.  HENT:  Head: Normocephalic and atraumatic.  Mouth/Throat: Oropharynx is clear and moist. No oropharyngeal exudate.  Eyes: Pupils are equal, round, and reactive to light.  Neck: Normal range of motion. Neck supple.  Cardiovascular: Normal rate and regular rhythm.  Pulmonary/Chest: No respiratory distress. She has no wheezes.  Abdominal: Soft. Bowel sounds are normal. She exhibits no distension and no mass. There is no tenderness. There is no rebound and no guarding.  Musculoskeletal: Normal range of motion. She exhibits no edema or tenderness.  Neurological: She is alert and oriented to person, place, and time.  Skin: Skin is warm.  Psychiatric: Affect normal.     LABORATORY DATA:  I have reviewed the data as listed    Component Value Date/Time   NA 137 12/21/2015 0419   NA 142 06/04/2014 1443   K 3.3 (L) 12/21/2015 0419   K 4.1 06/04/2014 1443   CL 105 12/21/2015 0419   CL 108 (H) 06/04/2014 1443   CO2 27 12/21/2015 0419   CO2 26 06/04/2014 1443   GLUCOSE 156 (H) 12/21/2015 0419   GLUCOSE 99 06/04/2014 1443   BUN 24 (H) 12/21/2015 0419   BUN 18 06/04/2014 1443   CREATININE 1.05 (H) 12/21/2015 0419   CREATININE 0.92 06/04/2014 1443   CALCIUM 8.8 (L) 12/21/2015 0419   CALCIUM 9.0 06/04/2014 1443   PROT 6.8 12/20/2015 1757   PROT 6.8 06/04/2014 1443   ALBUMIN 4.1 12/20/2015 1757   ALBUMIN 3.4 06/04/2014 1443   AST 28 12/20/2015 1757   AST 25 06/04/2014 1443   ALT 19 12/20/2015 1757   ALT 23 06/04/2014 1443   ALKPHOS 51 12/20/2015 1757   ALKPHOS 52 06/04/2014 1443   BILITOT 0.8 12/20/2015 1757   BILITOT 0.3 06/04/2014 1443   GFRNONAA 51 (L) 12/21/2015 0419   GFRNONAA >60 06/04/2014 1443   GFRNONAA 51 (L) 12/11/2013 1412   GFRAA 59 (L) 12/21/2015 0419   GFRAA >60 06/04/2014 1443   GFRAA 59 (L) 12/11/2013  1412    No results found for: SPEP, UPEP  Lab Results  Component Value Date   WBC 9.2 12/21/2015   NEUTROABS 4.0 12/20/2015   HGB 14.6 02/22/2018   HCT 44.7 02/22/2018   MCV 91.7 12/21/2015   PLT 161 12/21/2015      Chemistry      Component Value Date/Time   NA 137 12/21/2015 0419   NA 142 06/04/2014 1443   K 3.3 (L) 12/21/2015 0419   K 4.1 06/04/2014 1443   CL 105 12/21/2015 0419   CL 108 (H) 06/04/2014 1443   CO2 27 12/21/2015 0419   CO2 26 06/04/2014 1443  BUN 24 (H) 12/21/2015 0419   BUN 18 06/04/2014 1443   CREATININE 1.05 (H) 12/21/2015 0419   CREATININE 0.92 06/04/2014 1443      Component Value Date/Time   CALCIUM 8.8 (L) 12/21/2015 0419   CALCIUM 9.0 06/04/2014 1443   ALKPHOS 51 12/20/2015 1757   ALKPHOS 52 06/04/2014 1443   AST 28 12/20/2015 1757   AST 25 06/04/2014 1443   ALT 19 12/20/2015 1757   ALT 23 06/04/2014 1443   BILITOT 0.8 12/20/2015 1757   BILITOT 0.3 06/04/2014 1443          ASSESSMENT & PLAN:   Erythrocytosis # ERYTHROCYTOSIS-  Unclear etiology. 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  Patient is asymptomatic. HOLD  phlebotomy today as hematocrit is 44    # Elevated blood pressure-150s-150s/ finally improved- on polypharmacy [Dr.Lateef/Dr.Spark]  # DISPOSITION:  # NO phlebotomy today; no one blood appts.  # H&H/possible phlebotomy Q 6 weeks [pt pref];  follow up with me in 6 months;  phlebotomy.    Cammie Sickle, MD 02/22/2018 11:58 AM

## 2018-02-22 NOTE — Assessment & Plan Note (Addendum)
#   ERYTHROCYTOSIS-  Unclear etiology. 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  Patient is asymptomatic. HOLD  phlebotomy today as hematocrit is 44    # Elevated blood pressure-150s-150s/ finally improved- on polypharmacy [Dr.Lateef/Dr.Spark]  # DISPOSITION:  # NO phlebotomy today; no one blood appts.  # H&H/possible phlebotomy Q 6 weeks [pt pref];  follow up with me in 6 months;  phlebotomy.

## 2018-03-29 ENCOUNTER — Inpatient Hospital Stay: Payer: Medicare Other | Attending: Internal Medicine

## 2018-03-29 ENCOUNTER — Inpatient Hospital Stay: Payer: Medicare Other

## 2018-03-29 VITALS — BP 132/75 | HR 50 | Temp 97.1°F | Resp 20

## 2018-03-29 DIAGNOSIS — Z9011 Acquired absence of right breast and nipple: Secondary | ICD-10-CM | POA: Insufficient documentation

## 2018-03-29 DIAGNOSIS — Z79899 Other long term (current) drug therapy: Secondary | ICD-10-CM | POA: Diagnosis not present

## 2018-03-29 DIAGNOSIS — Z853 Personal history of malignant neoplasm of breast: Secondary | ICD-10-CM | POA: Insufficient documentation

## 2018-03-29 DIAGNOSIS — Z7982 Long term (current) use of aspirin: Secondary | ICD-10-CM | POA: Insufficient documentation

## 2018-03-29 DIAGNOSIS — D751 Secondary polycythemia: Secondary | ICD-10-CM | POA: Diagnosis present

## 2018-03-29 DIAGNOSIS — I10 Essential (primary) hypertension: Secondary | ICD-10-CM | POA: Diagnosis not present

## 2018-03-29 LAB — HEMOGLOBIN: Hemoglobin: 15.5 g/dL — ABNORMAL HIGH (ref 12.0–15.0)

## 2018-03-29 LAB — HEMATOCRIT: HCT: 48.8 % — ABNORMAL HIGH (ref 36.0–46.0)

## 2018-05-10 ENCOUNTER — Inpatient Hospital Stay: Payer: Medicare Other

## 2018-05-15 ENCOUNTER — Inpatient Hospital Stay: Payer: Medicare Other | Attending: Internal Medicine

## 2018-05-15 ENCOUNTER — Inpatient Hospital Stay: Payer: Medicare Other

## 2018-05-15 ENCOUNTER — Other Ambulatory Visit: Payer: Self-pay

## 2018-05-15 ENCOUNTER — Other Ambulatory Visit: Payer: Self-pay | Admitting: Internal Medicine

## 2018-05-15 DIAGNOSIS — Z853 Personal history of malignant neoplasm of breast: Secondary | ICD-10-CM | POA: Insufficient documentation

## 2018-05-15 DIAGNOSIS — D751 Secondary polycythemia: Secondary | ICD-10-CM

## 2018-05-15 DIAGNOSIS — Z79899 Other long term (current) drug therapy: Secondary | ICD-10-CM | POA: Diagnosis not present

## 2018-05-15 DIAGNOSIS — Z9011 Acquired absence of right breast and nipple: Secondary | ICD-10-CM | POA: Insufficient documentation

## 2018-05-15 LAB — HEMATOCRIT: HCT: 44.8 % (ref 36.0–46.0)

## 2018-05-15 LAB — HEMOGLOBIN: Hemoglobin: 14.7 g/dL (ref 12.0–15.0)

## 2018-05-15 NOTE — Progress Notes (Signed)
Hemoglobin 14.7, Hematocrit 44.8 per Dr. Rogue Bussing no phlebotomy needed at this time. Pt aware and denies any complaints at this time and agrees to plan. Pt stable at discharge.

## 2018-06-06 ENCOUNTER — Other Ambulatory Visit: Payer: Self-pay | Admitting: Internal Medicine

## 2018-06-06 DIAGNOSIS — Z1231 Encounter for screening mammogram for malignant neoplasm of breast: Secondary | ICD-10-CM

## 2018-06-21 ENCOUNTER — Inpatient Hospital Stay: Payer: Medicare Other

## 2018-06-25 ENCOUNTER — Other Ambulatory Visit: Payer: Self-pay | Admitting: *Deleted

## 2018-06-25 DIAGNOSIS — D751 Secondary polycythemia: Secondary | ICD-10-CM

## 2018-06-26 ENCOUNTER — Inpatient Hospital Stay: Payer: Medicare Other

## 2018-06-26 ENCOUNTER — Inpatient Hospital Stay: Payer: Medicare Other | Attending: Internal Medicine

## 2018-06-26 VITALS — BP 125/74 | HR 52 | Resp 18

## 2018-06-26 DIAGNOSIS — D751 Secondary polycythemia: Secondary | ICD-10-CM | POA: Diagnosis not present

## 2018-06-26 LAB — HEMOGLOBIN: Hemoglobin: 16.5 g/dL — ABNORMAL HIGH (ref 12.0–15.0)

## 2018-06-26 LAB — HEMATOCRIT: HCT: 50.7 % — ABNORMAL HIGH (ref 36.0–46.0)

## 2018-08-02 ENCOUNTER — Inpatient Hospital Stay: Payer: Medicare Other

## 2018-08-06 ENCOUNTER — Other Ambulatory Visit: Payer: Self-pay

## 2018-08-07 ENCOUNTER — Inpatient Hospital Stay: Payer: Medicare Other

## 2018-08-07 ENCOUNTER — Other Ambulatory Visit: Payer: Self-pay

## 2018-08-07 ENCOUNTER — Inpatient Hospital Stay: Payer: Medicare Other | Attending: Internal Medicine

## 2018-08-07 VITALS — BP 115/69 | HR 55 | Temp 97.9°F | Resp 18

## 2018-08-07 DIAGNOSIS — D751 Secondary polycythemia: Secondary | ICD-10-CM | POA: Insufficient documentation

## 2018-08-07 LAB — HEMATOCRIT: HCT: 48.8 % — ABNORMAL HIGH (ref 36.0–46.0)

## 2018-08-07 LAB — HEMOGLOBIN: Hemoglobin: 16.3 g/dL — ABNORMAL HIGH (ref 12.0–15.0)

## 2018-09-06 ENCOUNTER — Ambulatory Visit: Payer: Medicare Other | Admitting: Internal Medicine

## 2018-09-06 ENCOUNTER — Other Ambulatory Visit: Payer: Medicare Other

## 2018-09-11 ENCOUNTER — Telehealth: Payer: Self-pay | Admitting: *Deleted

## 2018-09-11 NOTE — Telephone Encounter (Signed)
Contacted patient per v/o Dr. Rogue Bussing. Discussed her up coming apts and requested a virtual visit with Dr. Jacinto Reap on 5/12. Pt agreeable to doximetry visit. She will have her lab/phlebotomy and then go home and await for the nurse/cma to reach patient and understand that a separate text invite would be received from Dr. B. She is agreeable. I r/s her MD portion of the visit to 3 pm that afternoon to allow sufficient time for patient to get back home after phlebotomy and I also adjusted her infusion apt time for the phlebotomy to 1:30 pm. She verbalized understanding and thanked me for calling her.

## 2018-09-17 ENCOUNTER — Other Ambulatory Visit: Payer: Self-pay

## 2018-09-18 ENCOUNTER — Inpatient Hospital Stay: Payer: Medicare Other

## 2018-09-18 ENCOUNTER — Encounter: Payer: Self-pay | Admitting: Internal Medicine

## 2018-09-18 ENCOUNTER — Inpatient Hospital Stay: Payer: Medicare Other | Attending: Internal Medicine

## 2018-09-18 ENCOUNTER — Other Ambulatory Visit: Payer: Self-pay

## 2018-09-18 ENCOUNTER — Inpatient Hospital Stay (HOSPITAL_BASED_OUTPATIENT_CLINIC_OR_DEPARTMENT_OTHER): Payer: Medicare Other | Admitting: Internal Medicine

## 2018-09-18 VITALS — BP 110/67 | HR 56 | Temp 98.0°F | Resp 18

## 2018-09-18 DIAGNOSIS — D751 Secondary polycythemia: Secondary | ICD-10-CM | POA: Diagnosis present

## 2018-09-18 DIAGNOSIS — Z79899 Other long term (current) drug therapy: Secondary | ICD-10-CM | POA: Diagnosis not present

## 2018-09-18 DIAGNOSIS — R030 Elevated blood-pressure reading, without diagnosis of hypertension: Secondary | ICD-10-CM | POA: Insufficient documentation

## 2018-09-18 LAB — HEMATOCRIT: HCT: 46.2 % — ABNORMAL HIGH (ref 36.0–46.0)

## 2018-09-18 LAB — HEMOGLOBIN: Hemoglobin: 15.2 g/dL — ABNORMAL HIGH (ref 12.0–15.0)

## 2018-09-18 NOTE — Progress Notes (Signed)
I connected with Michele Andrade on 09/18/18 at  3:00 PM EDT by telephone visit and verified that I am speaking with the correct person using two identifiers.  I discussed the limitations, risks, security and privacy concerns of performing an evaluation and management service by telemedicine and the availability of in-person appointments. I also discussed with the patient that there may be a patient responsible charge related to this service. The patient expressed understanding and agreed to proceed.    Other persons participating in the visit and their role in the encounter: None Patient's location: Home Provider's location: Office   No history exists.     Chief Complaint: Erythrocytosis   History of present illness:Michele Andrade 77 y.o.  female with history of erythrocytosis-likely secondary of unclear etiology.  Patient states her blood pressure better control at this time.  She denies any headaches.  Denies any blood clots.  No unusual shortness of breath or cough.  Observation/objective: Hematocrit is 46.6.  Assessment and plan: Erythrocytosis # ERYTHROCYTOSIS-  Unclear etiology. Jak 2 mutation/Exon 12 NEG;  Patient is asymptomatic.Phlebotomy today as hematocrit is 46.   # Elevated blood pressure-110-140- finally improved- on polypharmacy [Dr.Lateef/Dr.Spark]  # DISPOSITION:  # H&H/possible phlebotomy Q 6 weeks [pt pref]; no one blood appts.  # follow up in 6 months-  MD/ labs-cbc/bmp possible phlebotomy-Dr.B   Follow-up instructions:  I discussed the assessment and treatment plan with the patient.  The patient was provided an opportunity to ask questions and all were answered.  The patient agreed with the plan and demonstrated understanding of instructions.  The patient was advised to call back or seek an in person evaluation if the symptoms worsen or if the condition fails to improve as anticipated.  I provided 12  minutes of non face-to-face telephone visit time during  this encounter, and > 50% was spent counseling as documented under my assessment & plan.   Dr. Charlaine Dalton Dunmore at [facility] 09/18/2018 3:33 PM

## 2018-09-18 NOTE — Assessment & Plan Note (Signed)
#   ERYTHROCYTOSIS-  Unclear etiology. Jak 2 mutation/Exon 12 NEG;  Patient is asymptomatic.Phlebotomy today as hematocrit is 46.   # Elevated blood pressure-110-140- finally improved- on polypharmacy [Dr.Lateef/Dr.Spark]  # DISPOSITION:  # H&H/possible phlebotomy Q 6 weeks [pt pref]; no one blood appts.  # follow up in 6 months-  MD/ labs-cbc/bmp possible phlebotomy-Dr.B

## 2018-10-29 ENCOUNTER — Other Ambulatory Visit: Payer: Self-pay

## 2018-10-30 ENCOUNTER — Other Ambulatory Visit: Payer: Self-pay

## 2018-10-30 ENCOUNTER — Inpatient Hospital Stay: Payer: Medicare Other | Attending: Internal Medicine

## 2018-10-30 ENCOUNTER — Inpatient Hospital Stay: Payer: Medicare Other

## 2018-10-30 VITALS — BP 107/69 | HR 55 | Temp 98.4°F | Resp 18

## 2018-10-30 DIAGNOSIS — D751 Secondary polycythemia: Secondary | ICD-10-CM | POA: Insufficient documentation

## 2018-10-30 DIAGNOSIS — Z79899 Other long term (current) drug therapy: Secondary | ICD-10-CM | POA: Diagnosis not present

## 2018-10-30 LAB — HEMOGLOBIN: Hemoglobin: 14.8 g/dL (ref 12.0–15.0)

## 2018-10-30 LAB — HEMATOCRIT: HCT: 46.1 % — ABNORMAL HIGH (ref 36.0–46.0)

## 2018-12-06 ENCOUNTER — Ambulatory Visit
Admission: RE | Admit: 2018-12-06 | Discharge: 2018-12-06 | Disposition: A | Discharge status: Home / Self Care | Payer: Medicare Other | Source: Ambulatory Visit | Attending: Internal Medicine | Admitting: Internal Medicine

## 2018-12-06 DIAGNOSIS — Z1231 Encounter for screening mammogram for malignant neoplasm of breast: Secondary | ICD-10-CM

## 2018-12-10 ENCOUNTER — Other Ambulatory Visit: Payer: Self-pay

## 2018-12-11 ENCOUNTER — Inpatient Hospital Stay: Payer: Medicare Other

## 2018-12-11 ENCOUNTER — Other Ambulatory Visit: Payer: Self-pay

## 2018-12-11 ENCOUNTER — Inpatient Hospital Stay: Payer: Medicare Other | Attending: Internal Medicine

## 2018-12-11 DIAGNOSIS — D751 Secondary polycythemia: Secondary | ICD-10-CM | POA: Insufficient documentation

## 2018-12-11 DIAGNOSIS — Z79899 Other long term (current) drug therapy: Secondary | ICD-10-CM | POA: Diagnosis not present

## 2018-12-11 LAB — HEMOGLOBIN: Hemoglobin: 14.1 g/dL (ref 12.0–15.0)

## 2018-12-11 LAB — HEMATOCRIT: HCT: 43.6 % (ref 36.0–46.0)

## 2019-01-21 ENCOUNTER — Other Ambulatory Visit: Payer: Self-pay

## 2019-01-22 ENCOUNTER — Inpatient Hospital Stay: Payer: Medicare Other | Attending: Internal Medicine

## 2019-01-22 ENCOUNTER — Inpatient Hospital Stay: Payer: Medicare Other

## 2019-01-22 ENCOUNTER — Other Ambulatory Visit: Payer: Self-pay

## 2019-01-22 VITALS — BP 105/64 | HR 58 | Resp 18

## 2019-01-22 DIAGNOSIS — D751 Secondary polycythemia: Secondary | ICD-10-CM | POA: Diagnosis present

## 2019-01-22 LAB — HEMATOCRIT: HCT: 46.3 % — ABNORMAL HIGH (ref 36.0–46.0)

## 2019-01-22 LAB — HEMOGLOBIN: Hemoglobin: 15.2 g/dL — ABNORMAL HIGH (ref 12.0–15.0)

## 2019-03-05 ENCOUNTER — Other Ambulatory Visit: Payer: Self-pay

## 2019-03-05 ENCOUNTER — Inpatient Hospital Stay: Payer: Medicare Other

## 2019-03-05 ENCOUNTER — Inpatient Hospital Stay: Payer: Medicare Other | Attending: Internal Medicine

## 2019-03-05 VITALS — BP 111/71 | HR 58 | Resp 18

## 2019-03-05 DIAGNOSIS — D751 Secondary polycythemia: Secondary | ICD-10-CM | POA: Insufficient documentation

## 2019-03-05 LAB — HEMATOCRIT: HCT: 46.9 % — ABNORMAL HIGH (ref 36.0–46.0)

## 2019-03-05 LAB — HEMOGLOBIN: Hemoglobin: 15 g/dL (ref 12.0–15.0)

## 2019-03-26 ENCOUNTER — Other Ambulatory Visit: Payer: Medicare Other

## 2019-03-26 ENCOUNTER — Ambulatory Visit: Payer: Medicare Other | Admitting: Internal Medicine

## 2019-04-15 ENCOUNTER — Inpatient Hospital Stay: Payer: Medicare Other | Attending: Internal Medicine

## 2019-04-15 ENCOUNTER — Encounter: Payer: Self-pay | Admitting: Internal Medicine

## 2019-04-15 ENCOUNTER — Other Ambulatory Visit: Payer: Self-pay

## 2019-04-15 ENCOUNTER — Inpatient Hospital Stay: Payer: Medicare Other

## 2019-04-15 ENCOUNTER — Inpatient Hospital Stay (HOSPITAL_BASED_OUTPATIENT_CLINIC_OR_DEPARTMENT_OTHER): Payer: Medicare Other | Admitting: Internal Medicine

## 2019-04-15 VITALS — BP 109/70 | HR 53 | Resp 18

## 2019-04-15 DIAGNOSIS — Z79899 Other long term (current) drug therapy: Secondary | ICD-10-CM | POA: Diagnosis not present

## 2019-04-15 DIAGNOSIS — D751 Secondary polycythemia: Secondary | ICD-10-CM

## 2019-04-15 DIAGNOSIS — I10 Essential (primary) hypertension: Secondary | ICD-10-CM | POA: Diagnosis not present

## 2019-04-15 LAB — HEMATOCRIT: HCT: 46.7 % — ABNORMAL HIGH (ref 36.0–46.0)

## 2019-04-15 LAB — HEMOGLOBIN: Hemoglobin: 14.7 g/dL (ref 12.0–15.0)

## 2019-04-15 NOTE — Assessment & Plan Note (Addendum)
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  Proceed with phlebotomy today as hematocrit is 46.   # Elevated blood pressure-110-140- finally improved- on polypharmacy [Dr.Lateef/Dr.Spark]; phlebotomy seems to help Blood pressures.   # IV access- discussed re: Port placement.  Patient will let us know if she is interested.  # DISPOSITION:  # Phlebotomy today # H&H/possible phlebotomy Q 6 weeks [pt pref]; no one blood appts.  # follow up in 6 months-  MD/ labs-cbc/bmp possible phlebotomy-Dr.B

## 2019-04-15 NOTE — Progress Notes (Signed)
[facility] OFFICE PROGRESS NOTE  Patient Care Team: Idelle Crouch, MD as PCP - General (Internal Medicine) Cammie Sickle, MD as Medical Oncologist (Hematology and Oncology)   SUMMARY OF ONCOLOGIC HISTORY:  # April 2015- ERTHROCYTOSIS ; JAK-2/Exon- 12NEG; Epo-N; June 2017-  Phlebotomy every 6 weeks  # 1999-RIGHT BREAST- Mastec & ALND [[place]] & chemo; Tam x 5 years   INTERVAL HISTORY:  A very pleasant 78 year old female patient with above history of erythrocytosis since  April 2015 is here for follow-up/ Phlebotomy.  Patient continues to have intermittent spikes of blood pressure.  However fairly well controlled on as needed hydralazine/clonidine-managed by nephrology/PCP.  Notes to have intermittent headaches when the blood pressure goes up.  Otherwise no strokes.  No nausea no vomiting.  No blood clots.  Patient concerned about difficulty with blood draws.  Review of Systems  Constitutional: Negative for chills, diaphoresis, fever, malaise/fatigue and weight loss.  HENT: Negative for nosebleeds and sore throat.   Eyes: Negative for double vision.  Respiratory: Negative for cough, hemoptysis, sputum production, shortness of breath and wheezing.   Cardiovascular: Negative for chest pain, palpitations, orthopnea and leg swelling.  Gastrointestinal: Negative for abdominal pain, blood in stool, constipation, diarrhea, heartburn, melena, nausea and vomiting.  Genitourinary: Negative for dysuria, frequency and urgency.  Musculoskeletal: Negative for back pain and joint pain.  Skin: Negative.  Negative for itching and rash.  Neurological: Negative for dizziness, tingling, focal weakness, weakness and headaches.  Endo/Heme/Allergies: Does not bruise/bleed easily.  Psychiatric/Behavioral: Negative for depression. The patient is not nervous/anxious and does not have insomnia.      PAST MEDICAL HISTORY :  Past Medical History:  Diagnosis Date  . Breast  cancer ([place]) 1999   right breast ca  . Erythrocytosis 09/30/2014  . Hypertension     PAST SURGICAL HISTORY :   Past Surgical History:  Procedure Laterality Date  . MASTECTOMY Right 1999  . rotator cuff      FAMILY HISTORY :   Family History  Problem Relation Age of Onset  . Breast cancer Mother 3    SOCIAL HISTORY:   Social History   Tobacco Use  . Smoking status: Never Smoker  . Smokeless tobacco: Never Used  Substance Use Topics  . Alcohol use: Yes    Alcohol/week: 1.0 standard drinks    Types: 1 Glasses of wine per week    Comment: BID prn  . Drug use: Not on file    ALLERGIES:  has No Known Allergies.  MEDICATIONS:  Current Outpatient Medications  Medication Sig Dispense Refill  . amLODipine (NORVASC) 2.5 MG tablet Take 1 tablet by mouth daily.    Marland Kitchen aspirin 325 MG tablet Take 81 mg by mouth daily.     . Calcium Carbonate-Vitamin D 600-400 MG-UNIT per tablet Take 1 tablet by mouth 2 (two) times daily.     . cloNIDine (CATAPRES) 0.2 MG tablet Take 1 tablet by mouth 3 (three) times daily.    . Coenzyme Q10 (CO Q-10) 100 MG CAPS Take 100 mg by mouth every morning.     . hydrALAZINE (APRESOLINE) 50 MG tablet Take 1 tablet by mouth 3 (three) times daily.    Marland Kitchen lisinopril (PRINIVIL,ZESTRIL) 10 MG tablet Take 10 mg by mouth 3 (three) times daily.     Marland Kitchen MAGNESIUM GLYCINATE PLUS PO Take 200 mg by mouth every evening.     . Multiple Vitamin (MULTI-VITAMINS) TABS Take 1 tablet by mouth 2 (two) times daily.     Marland Kitchen  Omega-3 Fatty Acids (FISH OIL) 1000 MG CAPS Take 1,000 mg by mouth 2 (two) times daily.     . propranolol ER (INDERAL LA) 80 MG 24 hr capsule Take 80 mg by mouth every morning.    . riboflavin (VITAMIN B-2) 100 MG TABS tablet Take 300 mg by mouth 2 (two) times daily.     . TURMERIC PO Take 1 tablet by mouth 2 (two) times daily.     No current facility-administered medications for this visit.     PHYSICAL EXAMINATION: ECOG PERFORMANCE STATUS: 0 -  Asymptomatic  BP (!) 174/83 (BP Location: Left Arm, Patient Position: Sitting, Cuff Size: Normal)   Pulse 67   Temp 97.9 F (36.6 C) (Tympanic)   Wt 149 lb (67.6 kg)   BMI 27.25 kg/m   Filed Weights   04/15/19 1312  Weight: 149 lb (67.6 kg)    Physical Exam  Constitutional: She is oriented to person, place, and time and well-developed, well-nourished, and in no distress.  HENT:  Head: Normocephalic and atraumatic.  Mouth/Throat: Oropharynx is clear and moist. No oropharyngeal exudate.  Eyes: Pupils are equal, round, and reactive to light.  Neck: Normal range of motion. Neck supple.  Cardiovascular: Normal rate and regular rhythm.  Pulmonary/Chest: No respiratory distress. She has no wheezes.  Abdominal: Soft. Bowel sounds are normal. She exhibits no distension and no mass. There is no abdominal tenderness. There is no rebound and no guarding.  Musculoskeletal: Normal range of motion.        General: No tenderness or edema.  Neurological: She is alert and oriented to person, place, and time.  Skin: Skin is warm.  Psychiatric: Affect normal.     LABORATORY DATA:  I have reviewed the data as listed    Component Value Date/Time   NA 137 12/21/2015 0419   NA 142 06/04/2014 1443   K 3.3 (L) 12/21/2015 0419   K 4.1 06/04/2014 1443   CL 105 12/21/2015 0419   CL 108 (H) 06/04/2014 1443   CO2 27 12/21/2015 0419   CO2 26 06/04/2014 1443   GLUCOSE 156 (H) 12/21/2015 0419   GLUCOSE 99 06/04/2014 1443   BUN 24 (H) 12/21/2015 0419   BUN 18 06/04/2014 1443   CREATININE 1.05 (H) 12/21/2015 0419   CREATININE 0.92 06/04/2014 1443   CALCIUM 8.8 (L) 12/21/2015 0419   CALCIUM 9.0 06/04/2014 1443   PROT 6.8 12/20/2015 1757   PROT 6.8 06/04/2014 1443   ALBUMIN 4.1 12/20/2015 1757   ALBUMIN 3.4 06/04/2014 1443   AST 28 12/20/2015 1757   AST 25 06/04/2014 1443   ALT 19 12/20/2015 1757   ALT 23 06/04/2014 1443   ALKPHOS 51 12/20/2015 1757   ALKPHOS 52 06/04/2014 1443   BILITOT 0.8  12/20/2015 1757   BILITOT 0.3 06/04/2014 1443   GFRNONAA 51 (L) 12/21/2015 0419   GFRNONAA >60 06/04/2014 1443   GFRNONAA 51 (L) 12/11/2013 1412   GFRAA 59 (L) 12/21/2015 0419   GFRAA >60 06/04/2014 1443   GFRAA 59 (L) 12/11/2013 1412    No results found for: SPEP, UPEP  Lab Results  Component Value Date   WBC 9.2 12/21/2015   NEUTROABS 4.0 12/20/2015   HGB 14.7 04/15/2019   HCT 46.7 (H) 04/15/2019   MCV 91.7 12/21/2015   PLT 161 12/21/2015      Chemistry      Component Value Date/Time   NA 137 12/21/2015 0419   NA 142 06/04/2014 1443   K 3.3 (  L) 12/21/2015 0419   K 4.1 06/04/2014 1443   CL 105 12/21/2015 0419   CL 108 (H) 06/04/2014 1443   CO2 27 12/21/2015 0419   CO2 26 06/04/2014 1443   BUN 24 (H) 12/21/2015 0419   BUN 18 06/04/2014 1443   CREATININE 1.05 (H) 12/21/2015 0419   CREATININE 0.92 06/04/2014 1443      Component Value Date/Time   CALCIUM 8.8 (L) 12/21/2015 0419   CALCIUM 9.0 06/04/2014 1443   ALKPHOS 51 12/20/2015 1757   ALKPHOS 52 06/04/2014 1443   AST 28 12/20/2015 1757   AST 25 06/04/2014 1443   ALT 19 12/20/2015 1757   ALT 23 06/04/2014 1443   BILITOT 0.8 12/20/2015 1757   BILITOT 0.3 06/04/2014 1443          ASSESSMENT & PLAN:   Erythrocytosis # ERYTHROCYTOSIS-  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  Proceed with phlebotomy today as hematocrit is 46.   # Elevated blood pressure-110-140- finally improved- on polypharmacy [Dr.Lateef/Dr.Spark]; phlebotomy seems to help Blood pressures.   # IV access- discussed re: Port placement.  Patient will let us know if she is interested.  # DISPOSITION:  # Phlebotomy today # H&H/possible phlebotomy Q 6 weeks [pt pref]; no one blood appts.  # follow up in 6 months-  MD/ labs-cbc/bmp possible phlebotomy-Dr.B    ;Cammie Sickle, MD 04/15/2019 2:36 PM

## 2019-04-15 NOTE — Progress Notes (Signed)
therapeutic phlebotomy performed per MD order. 280cc removed using a 20 gauge PIV in left hand. IV access was lost prior to obtaining the remaining 20cc, pt verbalizes that she does not want to be stuck again. MD aware and NNO at this time. Pt tolerated procedure well. Drink provided, pt declined snack. Pt and VS stable at discharge.

## 2019-04-16 ENCOUNTER — Ambulatory Visit: Payer: Medicare Other | Admitting: Internal Medicine

## 2019-04-16 ENCOUNTER — Other Ambulatory Visit: Payer: Medicare Other

## 2019-05-24 ENCOUNTER — Other Ambulatory Visit: Payer: Self-pay

## 2019-05-27 ENCOUNTER — Inpatient Hospital Stay: Payer: Medicare Other

## 2019-05-27 ENCOUNTER — Other Ambulatory Visit: Payer: Self-pay

## 2019-05-27 ENCOUNTER — Inpatient Hospital Stay: Payer: Medicare Other | Attending: Internal Medicine

## 2019-05-27 VITALS — BP 113/71 | HR 51 | Temp 97.7°F | Resp 18

## 2019-05-27 DIAGNOSIS — D751 Secondary polycythemia: Secondary | ICD-10-CM | POA: Diagnosis not present

## 2019-05-27 DIAGNOSIS — Z79899 Other long term (current) drug therapy: Secondary | ICD-10-CM | POA: Diagnosis not present

## 2019-05-27 DIAGNOSIS — I10 Essential (primary) hypertension: Secondary | ICD-10-CM | POA: Diagnosis not present

## 2019-05-27 LAB — HEMATOCRIT: HCT: 47.5 % — ABNORMAL HIGH (ref 36.0–46.0)

## 2019-05-27 LAB — HEMOGLOBIN: Hemoglobin: 15 g/dL (ref 12.0–15.0)

## 2019-06-06 ENCOUNTER — Telehealth: Payer: Self-pay | Admitting: *Deleted

## 2019-06-06 NOTE — Telephone Encounter (Signed)
Spoke with patient. She would like to push her apts out by 2 weeks. Doesn't not want to come as often as she is getting labs drawn with pcp-Dr. Doy Hutching. Also her veins are becoming a little more "challenging" to access.  Would like to change the apt from March 1 to March 15'th and the apts in April to May 10'th. She would like to cnl the apts in June and see Dr. Jacinto Reap on May 10'th.  Apts changed per her request. She thanked me for calling her back.

## 2019-06-06 NOTE — Telephone Encounter (Signed)
Patient called stating she wants to discuss a plan of care change in her "intervals between blood checks" with Dr B after she had seen her PCP and discussed some concerns she has with Dr Doy Hutching. She requests Dr B return her call [phone]

## 2019-06-07 NOTE — Telephone Encounter (Signed)
I agree with your plan, Nira Conn.. Thanks you!

## 2019-07-08 ENCOUNTER — Inpatient Hospital Stay: Payer: Medicare Other

## 2019-07-22 ENCOUNTER — Inpatient Hospital Stay: Payer: Medicare Other

## 2019-07-22 ENCOUNTER — Other Ambulatory Visit: Payer: Self-pay | Admitting: *Deleted

## 2019-07-22 ENCOUNTER — Inpatient Hospital Stay: Payer: Medicare Other | Attending: Internal Medicine

## 2019-07-22 VITALS — BP 121/72 | HR 55 | Temp 97.0°F | Resp 18

## 2019-07-22 DIAGNOSIS — D751 Secondary polycythemia: Secondary | ICD-10-CM | POA: Diagnosis present

## 2019-07-22 LAB — BASIC METABOLIC PANEL
Anion gap: 7 (ref 5–15)
BUN: 22 mg/dL (ref 8–23)
CO2: 25 mmol/L (ref 22–32)
Calcium: 8.9 mg/dL (ref 8.9–10.3)
Chloride: 104 mmol/L (ref 98–111)
Creatinine, Ser: 0.94 mg/dL (ref 0.44–1.00)
GFR calc Af Amer: >60 mL/min (ref >60)
GFR calc non Af Amer: 59 mL/min — ABNORMAL LOW (ref >60)
Glucose, Bld: 85 mg/dL (ref 70–99)
Potassium: 3.9 mmol/L (ref 3.5–5.1)
Sodium: 136 mmol/L (ref 135–145)

## 2019-07-22 LAB — CBC WITH DIFFERENTIAL/PLATELET
Abs Immature Granulocytes: 0.01 10*3/uL (ref 0.00–0.07)
Basophils Absolute: 0 10*3/uL (ref 0.0–0.1)
Basophils Relative: 0 %
Eosinophils Absolute: 0.1 10*3/uL (ref 0.0–0.5)
Eosinophils Relative: 1 %
HCT: 45.5 % (ref 36.0–46.0)
Hemoglobin: 14.7 g/dL (ref 12.0–15.0)
Immature Granulocytes: 0 %
Lymphocytes Relative: 20 %
Lymphs Abs: 1.5 10*3/uL (ref 0.7–4.0)
MCH: 29 pg (ref 26.0–34.0)
MCHC: 32.3 g/dL (ref 30.0–36.0)
MCV: 89.7 fL (ref 80.0–100.0)
Monocytes Absolute: 0.8 10*3/uL (ref 0.1–1.0)
Monocytes Relative: 11 %
Neutro Abs: 4.9 10*3/uL (ref 1.7–7.7)
Neutrophils Relative %: 68 %
Platelets: 205 10*3/uL (ref 150–400)
RBC: 5.07 MIL/uL (ref 3.87–5.11)
RDW: 13.5 % (ref 11.5–15.5)
WBC: 7.3 10*3/uL (ref 4.0–10.5)
nRBC: 0 % (ref 0.0–0.2)

## 2019-07-22 NOTE — Progress Notes (Signed)
Labs reviewed with MD. Per MD to continue with phlebotomy today. Pt updated and all questions answered at this time.   Tranika Scholler [employer]

## 2019-08-19 ENCOUNTER — Inpatient Hospital Stay: Payer: Medicare Other

## 2019-09-10 ENCOUNTER — Other Ambulatory Visit: Payer: Self-pay | Admitting: Internal Medicine

## 2019-09-10 DIAGNOSIS — Z1231 Encounter for screening mammogram for malignant neoplasm of breast: Secondary | ICD-10-CM

## 2019-09-16 ENCOUNTER — Inpatient Hospital Stay (HOSPITAL_BASED_OUTPATIENT_CLINIC_OR_DEPARTMENT_OTHER): Payer: Medicare Other | Admitting: Internal Medicine

## 2019-09-16 ENCOUNTER — Other Ambulatory Visit: Payer: Self-pay

## 2019-09-16 ENCOUNTER — Inpatient Hospital Stay: Payer: Medicare Other | Attending: Internal Medicine

## 2019-09-16 ENCOUNTER — Inpatient Hospital Stay: Payer: Medicare Other

## 2019-09-16 ENCOUNTER — Encounter: Payer: Self-pay | Admitting: Internal Medicine

## 2019-09-16 DIAGNOSIS — R739 Hyperglycemia, unspecified: Secondary | ICD-10-CM | POA: Insufficient documentation

## 2019-09-16 DIAGNOSIS — Z79899 Other long term (current) drug therapy: Secondary | ICD-10-CM | POA: Diagnosis not present

## 2019-09-16 DIAGNOSIS — Z853 Personal history of malignant neoplasm of breast: Secondary | ICD-10-CM | POA: Diagnosis not present

## 2019-09-16 DIAGNOSIS — I10 Essential (primary) hypertension: Secondary | ICD-10-CM | POA: Insufficient documentation

## 2019-09-16 DIAGNOSIS — Z803 Family history of malignant neoplasm of breast: Secondary | ICD-10-CM | POA: Diagnosis not present

## 2019-09-16 DIAGNOSIS — D751 Secondary polycythemia: Secondary | ICD-10-CM

## 2019-09-16 DIAGNOSIS — N189 Chronic kidney disease, unspecified: Secondary | ICD-10-CM | POA: Insufficient documentation

## 2019-09-16 DIAGNOSIS — Z170 Estrogen receptor positive status [ER+]: Secondary | ICD-10-CM | POA: Diagnosis not present

## 2019-09-16 DIAGNOSIS — Z7982 Long term (current) use of aspirin: Secondary | ICD-10-CM | POA: Insufficient documentation

## 2019-09-16 DIAGNOSIS — Z9011 Acquired absence of right breast and nipple: Secondary | ICD-10-CM | POA: Diagnosis not present

## 2019-09-16 LAB — CBC WITH DIFFERENTIAL/PLATELET
Abs Immature Granulocytes: 0.01 10*3/uL (ref 0.00–0.07)
Basophils Absolute: 0 10*3/uL (ref 0.0–0.1)
Basophils Relative: 0 %
Eosinophils Absolute: 0.1 10*3/uL (ref 0.0–0.5)
Eosinophils Relative: 1 %
HCT: 45.1 % (ref 36.0–46.0)
Hemoglobin: 14.8 g/dL (ref 12.0–15.0)
Immature Granulocytes: 0 %
Lymphocytes Relative: 24 %
Lymphs Abs: 1.6 10*3/uL (ref 0.7–4.0)
MCH: 29 pg (ref 26.0–34.0)
MCHC: 32.8 g/dL (ref 30.0–36.0)
MCV: 88.4 fL (ref 80.0–100.0)
Monocytes Absolute: 0.5 10*3/uL (ref 0.1–1.0)
Monocytes Relative: 8 %
Neutro Abs: 4.7 10*3/uL (ref 1.7–7.7)
Neutrophils Relative %: 67 %
Platelets: 202 10*3/uL (ref 150–400)
RBC: 5.1 MIL/uL (ref 3.87–5.11)
RDW: 14.1 % (ref 11.5–15.5)
WBC: 6.9 10*3/uL (ref 4.0–10.5)
nRBC: 0 % (ref 0.0–0.2)

## 2019-09-16 LAB — BASIC METABOLIC PANEL
Anion gap: 9 (ref 5–15)
BUN: 22 mg/dL (ref 8–23)
CO2: 26 mmol/L (ref 22–32)
Calcium: 8.8 mg/dL — ABNORMAL LOW (ref 8.9–10.3)
Chloride: 102 mmol/L (ref 98–111)
Creatinine, Ser: 1.1 mg/dL — ABNORMAL HIGH (ref 0.44–1.00)
GFR calc Af Amer: 56 mL/min — ABNORMAL LOW (ref >60)
GFR calc non Af Amer: 48 mL/min — ABNORMAL LOW (ref >60)
Glucose, Bld: 125 mg/dL — ABNORMAL HIGH (ref 70–99)
Potassium: 3.8 mmol/L (ref 3.5–5.1)
Sodium: 137 mmol/L (ref 135–145)

## 2019-09-16 NOTE — Progress Notes (Signed)
Patient presents to clinic for a therapeutic phlebotomy. She denies any headaches.

## 2019-09-16 NOTE — Progress Notes (Signed)
[facility] OFFICE PROGRESS NOTE  Patient Care Team: Idelle Crouch, MD as PCP - General (Internal Medicine) Cammie Sickle, MD as Medical Oncologist (Hematology and Oncology)   SUMMARY OF ONCOLOGIC HISTORY:  # April 2015- ERTHROCYTOSIS ; JAK-2/Exon- 12NEG; Epo-N; June 2017-  Phlebotomy every 6 weeks  # 1999-RIGHT BREAST- Mastec & ALND [[place]] & chemo; Tam x 5 years   INTERVAL HISTORY:  A very pleasant 79 year old female patient with above history of erythrocytosis since  April 2015 is here for follow-up/ Phlebotomy.  Patient states her blood pressures are better controlled at this time.  She denies any significant spikes in blood pressure.  Most recently noted to have slightly elevated blood sugars.  She is concerned about being in prediabetic range.  Interested in a nutrition evaluation.   Review of Systems  Constitutional: Negative for chills, diaphoresis, fever, malaise/fatigue and weight loss.  HENT: Negative for nosebleeds and sore throat.   Eyes: Negative for double vision.  Respiratory: Negative for cough, hemoptysis, sputum production, shortness of breath and wheezing.   Cardiovascular: Negative for chest pain, palpitations, orthopnea and leg swelling.  Gastrointestinal: Negative for abdominal pain, blood in stool, constipation, diarrhea, heartburn, melena, nausea and vomiting.  Genitourinary: Negative for dysuria, frequency and urgency.  Musculoskeletal: Negative for back pain and joint pain.  Skin: Negative.  Negative for itching and rash.  Neurological: Negative for dizziness, tingling, focal weakness, weakness and headaches.  Endo/Heme/Allergies: Does not bruise/bleed easily.  Psychiatric/Behavioral: Negative for depression. The patient is not nervous/anxious and does not have insomnia.      PAST MEDICAL HISTORY :  Past Medical History:  Diagnosis Date  . Breast cancer ([place]) 1999   right breast ca  . Erythrocytosis 09/30/2014  .  Hypertension     PAST SURGICAL HISTORY :   Past Surgical History:  Procedure Laterality Date  . MASTECTOMY Right 1999  . rotator cuff      FAMILY HISTORY :   Family History  Problem Relation Age of Onset  . Breast cancer Mother 29    SOCIAL HISTORY:   Social History   Tobacco Use  . Smoking status: Never Smoker  . Smokeless tobacco: Never Used  Substance Use Topics  . Alcohol use: Yes    Alcohol/week: 1.0 standard drinks    Types: 1 Glasses of wine per week    Comment: BID prn  . Drug use: Not on file    ALLERGIES:  has No Known Allergies.  MEDICATIONS:  Current Outpatient Medications  Medication Sig Dispense Refill  . amLODipine (NORVASC) 2.5 MG tablet Take 1 tablet by mouth daily.    Marland Kitchen aspirin 81 MG EC tablet Take 81 mg by mouth daily.     . Calcium Carbonate-Vitamin D 600-400 MG-UNIT per tablet Take 1 tablet by mouth 2 (two) times daily.     . cloNIDine (CATAPRES) 0.2 MG tablet Take 1 tablet by mouth 3 (three) times daily.    . Coenzyme Q10 (CO Q-10) 100 MG CAPS Take 100 mg by mouth every morning.     . hydrALAZINE (APRESOLINE) 50 MG tablet Take 1 tablet by mouth 3 (three) times daily.    Marland Kitchen lisinopril (PRINIVIL,ZESTRIL) 10 MG tablet Take 10 mg by mouth 3 (three) times daily.     Marland Kitchen MAGNESIUM GLYCINATE PLUS PO Take 200 mg by mouth every evening.     . Multiple Vitamin (MULTI-VITAMINS) TABS Take 1 tablet by mouth 2 (two) times daily.     . Omega-3  Fatty Acids (FISH OIL) 1000 MG CAPS Take 1,000 mg by mouth 2 (two) times daily.     . propranolol ER (INDERAL LA) 80 MG 24 hr capsule Take 80 mg by mouth every morning.    . riboflavin (VITAMIN B-2) 100 MG TABS tablet Take 100 mg by mouth daily.     . TURMERIC PO Take 1 tablet by mouth 2 (two) times daily.    . fluticasone (FLONASE) 50 MCG/ACT nasal spray Place 1 spray into both nostrils at bedtime.     No current facility-administered medications for this visit.    PHYSICAL EXAMINATION: ECOG PERFORMANCE STATUS: 0 -  Asymptomatic  BP (!) 101/54   Pulse (!) 56   Temp (!) 96.9 F (36.1 C) (Tympanic)   Resp 20   There were no vitals filed for this visit.  Physical Exam  Constitutional: She is oriented to person, place, and time and well-developed, well-nourished, and in no distress.  HENT:  Head: Normocephalic and atraumatic.  Mouth/Throat: Oropharynx is clear and moist. No oropharyngeal exudate.  Eyes: Pupils are equal, round, and reactive to light.  Cardiovascular: Normal rate and regular rhythm.  Pulmonary/Chest: No respiratory distress. She has no wheezes.  Abdominal: Soft. Bowel sounds are normal. She exhibits no distension and no mass. There is no abdominal tenderness. There is no rebound and no guarding.  Musculoskeletal:        General: No tenderness or edema. Normal range of motion.     Cervical back: Normal range of motion and neck supple.  Neurological: She is alert and oriented to person, place, and time.  Skin: Skin is warm.  Psychiatric: Affect normal.     LABORATORY DATA:  I have reviewed the data as listed    Component Value Date/Time   NA 137 09/16/2019 1405   NA 142 06/04/2014 1443   K 3.8 09/16/2019 1405   K 4.1 06/04/2014 1443   CL 102 09/16/2019 1405   CL 108 (H) 06/04/2014 1443   CO2 26 09/16/2019 1405   CO2 26 06/04/2014 1443   GLUCOSE 125 (H) 09/16/2019 1405   GLUCOSE 99 06/04/2014 1443   BUN 22 09/16/2019 1405   BUN 18 06/04/2014 1443   CREATININE 1.10 (H) 09/16/2019 1405   CREATININE 0.92 06/04/2014 1443   CALCIUM 8.8 (L) 09/16/2019 1405   CALCIUM 9.0 06/04/2014 1443   PROT 6.8 12/20/2015 1757   PROT 6.8 06/04/2014 1443   ALBUMIN 4.1 12/20/2015 1757   ALBUMIN 3.4 06/04/2014 1443   AST 28 12/20/2015 1757   AST 25 06/04/2014 1443   ALT 19 12/20/2015 1757   ALT 23 06/04/2014 1443   ALKPHOS 51 12/20/2015 1757   ALKPHOS 52 06/04/2014 1443   BILITOT 0.8 12/20/2015 1757   BILITOT 0.3 06/04/2014 1443   GFRNONAA 48 (L) 09/16/2019 1405   GFRNONAA >60  06/04/2014 1443   GFRNONAA 51 (L) 12/11/2013 1412   GFRAA 56 (L) 09/16/2019 1405   GFRAA >60 06/04/2014 1443   GFRAA 59 (L) 12/11/2013 1412    No results found for: SPEP, UPEP  Lab Results  Component Value Date   WBC 6.9 09/16/2019   NEUTROABS 4.7 09/16/2019   HGB 14.8 09/16/2019   HCT 45.1 09/16/2019   MCV 88.4 09/16/2019   PLT 202 09/16/2019      Chemistry      Component Value Date/Time   NA 137 09/16/2019 1405   NA 142 06/04/2014 1443   K 3.8 09/16/2019 1405   K 4.1 06/04/2014  1443   CL 102 09/16/2019 1405   CL 108 (H) 06/04/2014 1443   CO2 26 09/16/2019 1405   CO2 26 06/04/2014 1443   BUN 22 09/16/2019 1405   BUN 18 06/04/2014 1443   CREATININE 1.10 (H) 09/16/2019 1405   CREATININE 0.92 06/04/2014 1443      Component Value Date/Time   CALCIUM 8.8 (L) 09/16/2019 1405   CALCIUM 9.0 06/04/2014 1443   ALKPHOS 51 12/20/2015 1757   ALKPHOS 52 06/04/2014 1443   AST 28 12/20/2015 1757   AST 25 06/04/2014 1443   ALT 19 12/20/2015 1757   ALT 23 06/04/2014 1443   BILITOT 0.8 12/20/2015 1757   BILITOT 0.3 06/04/2014 1443          ASSESSMENT & PLAN:   Erythrocytosis # ERYTHROCYTOSIS-  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  Hold phlebotomy today hematocrit 45.  # Elevated blood pressure-110-140-improved.  on polypharmacy [Dr.Lateef/Dr.Spark]; phlebotomy seems to help Blood pressures.   # CKD- cGFR 48- reocmmend follow up with Dr.Lateef.   #Prediabetes-recommend evaluation with nutrition/Joli.   # DISPOSITION:  # Joli/ Nutrition appt re: pre-diabetes/  # HOLD Phlebotomy today # H&H; BMP/possible phlebotomy Q 8 weeks [pt pref]; no one blood appts.  # follow up in 4 months-  MD/ labs-cbc/bmp possible phlebotomy-Dr.B  Cc; Dr.Lateef/Dr.Sparks    ;Cammie Sickle, MD 09/22/2019 8:20 PM

## 2019-09-16 NOTE — Patient Instructions (Signed)
#   check Blood pressures at home; and reach out to [facility] if blood pressures are on lower side [100s] to help with kidney number.

## 2019-09-16 NOTE — Assessment & Plan Note (Addendum)
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  Hold phlebotomy today hematocrit 45.  # Elevated blood pressure-110-140-improved.  on polypharmacy [Dr.Lateef/Dr.Spark]; phlebotomy seems to help Blood pressures.   # CKD- cGFR 48- reocmmend follow up with Dr.Lateef.   #Prediabetes-recommend evaluation with nutrition/Joli.   # DISPOSITION:  # Joli/ Nutrition appt re: pre-diabetes/  # HOLD Phlebotomy today # H&H; BMP/possible phlebotomy Q 8 weeks [pt pref]; no one blood appts.  # follow up in 4 months-  MD/ labs-cbc/bmp possible phlebotomy-Dr.B  Cc; Dr.Lateef/Dr.Sparks 

## 2019-09-30 ENCOUNTER — Inpatient Hospital Stay: Payer: Medicare Other

## 2019-10-08 ENCOUNTER — Ambulatory Visit: Payer: Medicare Other | Admitting: Internal Medicine

## 2019-10-08 ENCOUNTER — Other Ambulatory Visit: Payer: Medicare Other

## 2019-10-21 ENCOUNTER — Other Ambulatory Visit: Payer: Self-pay

## 2019-10-21 ENCOUNTER — Inpatient Hospital Stay: Payer: Medicare Other | Attending: Internal Medicine

## 2019-10-21 NOTE — Progress Notes (Signed)
Nutrition Assessment   Reason for Assessment:  Questions regarding carbohydrates and pre-diabetes   ASSESSMENT: 79 year old female with erthrocytosis and pre-diabetes.  Past medical history of HTN, breast cancer in 1999.  Followed by Dr. Jacinto Reap.  Met with patient in clinic.  Patient with good appetite. Lives at [facility] at [place].  Patient has been monitoring carbohydrate foods and grams of sugar.  Exercises on regular basis.   Medications: reviewed   Labs: A1c 6.4 (spike) Jan 2021 (eating prunes and grapes A1c 6.1% in April   Anthropometrics:   Height: 62 inches Weight: 149 lb (stable) BMI: 27   NUTRITION DIAGNOSIS: Food and nutrition related knowledge deficit related to foods to increase glucose level as evidenced by wanting to meet with RD     INTERVENTION:  Reviewed carbohydrates, reading food labels and goals for carbohydrates per meal.  Handouts from [facility] provided to patient.  Questions answered.   Patient has contact information and knows to contact RD with further questions.    Next Visit: no follow-up, patient to call with questions  Michele Andrade, [place], [place] Registered Dietitian [phone] (pager)

## 2019-11-12 ENCOUNTER — Other Ambulatory Visit: Payer: Self-pay

## 2019-11-12 ENCOUNTER — Inpatient Hospital Stay: Payer: Medicare Other | Attending: Internal Medicine

## 2019-11-12 ENCOUNTER — Inpatient Hospital Stay: Payer: Medicare Other

## 2019-11-12 VITALS — BP 113/69 | HR 54 | Resp 18

## 2019-11-12 DIAGNOSIS — D751 Secondary polycythemia: Secondary | ICD-10-CM

## 2019-11-12 DIAGNOSIS — Z853 Personal history of malignant neoplasm of breast: Secondary | ICD-10-CM | POA: Insufficient documentation

## 2019-11-12 DIAGNOSIS — Z79899 Other long term (current) drug therapy: Secondary | ICD-10-CM | POA: Insufficient documentation

## 2019-11-12 DIAGNOSIS — Z9011 Acquired absence of right breast and nipple: Secondary | ICD-10-CM | POA: Insufficient documentation

## 2019-11-12 DIAGNOSIS — Z170 Estrogen receptor positive status [ER+]: Secondary | ICD-10-CM | POA: Insufficient documentation

## 2019-11-12 LAB — BASIC METABOLIC PANEL
Anion gap: 10 (ref 5–15)
BUN: 26 mg/dL — ABNORMAL HIGH (ref 8–23)
CO2: 25 mmol/L (ref 22–32)
Calcium: 8.8 mg/dL — ABNORMAL LOW (ref 8.9–10.3)
Chloride: 104 mmol/L (ref 98–111)
Creatinine, Ser: 1.04 mg/dL — ABNORMAL HIGH (ref 0.44–1.00)
GFR calc Af Amer: 60 mL/min — ABNORMAL LOW (ref >60)
GFR calc non Af Amer: 51 mL/min — ABNORMAL LOW (ref >60)
Glucose, Bld: 120 mg/dL — ABNORMAL HIGH (ref 70–99)
Potassium: 4.1 mmol/L (ref 3.5–5.1)
Sodium: 139 mmol/L (ref 135–145)

## 2019-11-12 LAB — HEMOGLOBIN: Hemoglobin: 15.4 g/dL — ABNORMAL HIGH (ref 12.0–15.0)

## 2019-11-12 LAB — HEMATOCRIT: HCT: 45.2 % (ref 36.0–46.0)

## 2019-11-12 NOTE — Progress Notes (Signed)
HGB 15.4 HCT 45.2. Per Dr. Rogue Bussing, proceed with phlebotomy treatment.

## 2019-12-09 ENCOUNTER — Ambulatory Visit
Admission: RE | Admit: 2019-12-09 | Discharge: 2019-12-09 | Disposition: A | Discharge status: Home / Self Care | Payer: Medicare Other | Source: Ambulatory Visit | Attending: Internal Medicine | Admitting: Internal Medicine

## 2019-12-09 DIAGNOSIS — Z1231 Encounter for screening mammogram for malignant neoplasm of breast: Secondary | ICD-10-CM | POA: Insufficient documentation

## 2020-01-06 ENCOUNTER — Other Ambulatory Visit: Payer: Self-pay

## 2020-01-06 ENCOUNTER — Inpatient Hospital Stay: Payer: Medicare Other | Attending: Internal Medicine

## 2020-01-06 ENCOUNTER — Inpatient Hospital Stay: Payer: Medicare Other

## 2020-01-06 VITALS — BP 116/63 | HR 60 | Temp 97.4°F | Resp 20

## 2020-01-06 DIAGNOSIS — D751 Secondary polycythemia: Secondary | ICD-10-CM | POA: Insufficient documentation

## 2020-01-06 LAB — CBC WITH DIFFERENTIAL/PLATELET
Abs Immature Granulocytes: 0.02 10*3/uL (ref 0.00–0.07)
Basophils Absolute: 0 10*3/uL (ref 0.0–0.1)
Basophils Relative: 1 %
Eosinophils Absolute: 0.1 10*3/uL (ref 0.0–0.5)
Eosinophils Relative: 1 %
HCT: 47.9 % — ABNORMAL HIGH (ref 36.0–46.0)
Hemoglobin: 16.1 g/dL — ABNORMAL HIGH (ref 12.0–15.0)
Immature Granulocytes: 0 %
Lymphocytes Relative: 22 %
Lymphs Abs: 1.6 10*3/uL (ref 0.7–4.0)
MCH: 30.4 pg (ref 26.0–34.0)
MCHC: 33.6 g/dL (ref 30.0–36.0)
MCV: 90.5 fL (ref 80.0–100.0)
Monocytes Absolute: 0.6 10*3/uL (ref 0.1–1.0)
Monocytes Relative: 8 %
Neutro Abs: 4.9 10*3/uL (ref 1.7–7.7)
Neutrophils Relative %: 68 %
Platelets: 179 10*3/uL (ref 150–400)
RBC: 5.29 MIL/uL — ABNORMAL HIGH (ref 3.87–5.11)
RDW: 13.5 % (ref 11.5–15.5)
WBC: 7.3 10*3/uL (ref 4.0–10.5)
nRBC: 0 % (ref 0.0–0.2)

## 2020-01-06 LAB — BASIC METABOLIC PANEL
Anion gap: 11 (ref 5–15)
BUN: 25 mg/dL — ABNORMAL HIGH (ref 8–23)
CO2: 27 mmol/L (ref 22–32)
Calcium: 9 mg/dL (ref 8.9–10.3)
Chloride: 104 mmol/L (ref 98–111)
Creatinine, Ser: 0.83 mg/dL (ref 0.44–1.00)
GFR calc Af Amer: >60 mL/min (ref >60)
GFR calc non Af Amer: >60 mL/min (ref >60)
Glucose, Bld: 97 mg/dL (ref 70–99)
Potassium: 4.1 mmol/L (ref 3.5–5.1)
Sodium: 142 mmol/L (ref 135–145)

## 2020-01-20 ENCOUNTER — Ambulatory Visit: Payer: Medicare Other | Admitting: Internal Medicine

## 2020-01-20 ENCOUNTER — Other Ambulatory Visit: Payer: Medicare Other

## 2020-02-15 ENCOUNTER — Emergency Department: Payer: Medicare Other

## 2020-02-15 ENCOUNTER — Other Ambulatory Visit: Payer: Self-pay

## 2020-02-15 ENCOUNTER — Emergency Department
Admission: EM | Admit: 2020-02-15 | Discharge: 2020-02-17 | Disposition: A | Discharge status: Skilled Nursing Facility | Payer: Medicare Other | Attending: Emergency Medicine | Admitting: Emergency Medicine

## 2020-02-15 DIAGNOSIS — Z7982 Long term (current) use of aspirin: Secondary | ICD-10-CM | POA: Diagnosis not present

## 2020-02-15 DIAGNOSIS — T148XXA Other injury of unspecified body region, initial encounter: Secondary | ICD-10-CM

## 2020-02-15 DIAGNOSIS — Z79899 Other long term (current) drug therapy: Secondary | ICD-10-CM | POA: Diagnosis not present

## 2020-02-15 DIAGNOSIS — Z853 Personal history of malignant neoplasm of breast: Secondary | ICD-10-CM | POA: Diagnosis not present

## 2020-02-15 DIAGNOSIS — W19XXXA Unspecified fall, initial encounter: Secondary | ICD-10-CM | POA: Diagnosis not present

## 2020-02-15 DIAGNOSIS — Y9301 Activity, walking, marching and hiking: Secondary | ICD-10-CM | POA: Insufficient documentation

## 2020-02-15 DIAGNOSIS — S8992XA Unspecified injury of left lower leg, initial encounter: Secondary | ICD-10-CM | POA: Diagnosis present

## 2020-02-15 DIAGNOSIS — I10 Essential (primary) hypertension: Secondary | ICD-10-CM | POA: Insufficient documentation

## 2020-02-15 DIAGNOSIS — M25562 Pain in left knee: Secondary | ICD-10-CM

## 2020-02-15 DIAGNOSIS — Z20822 Contact with and (suspected) exposure to covid-19: Secondary | ICD-10-CM | POA: Insufficient documentation

## 2020-02-15 DIAGNOSIS — S8002XA Contusion of left knee, initial encounter: Secondary | ICD-10-CM | POA: Insufficient documentation

## 2020-02-15 MED ORDER — ACETAMINOPHEN 500 MG PO TABS
1000.0000 mg | ORAL_TABLET | Freq: Once | ORAL | Status: AC
  Administered 2020-02-15: 1000 mg via ORAL
  Filled 2020-02-15: qty 2

## 2020-02-15 NOTE — ED Notes (Addendum)
Pts left knee abrasion cleaned with NS and iodine. Wound then dried. Xerform dressing placed and secured with 4x4 dressing and gauze wrap. ACE bandage placed to left knee. Capillary refill less than 2 seconds after dressing and ACE bandaged placed.  Pt educated that if she gets, numbness or tingling to extremity to take off wrap and rewrapped to be less tight or to let a nurse know. Pt has a biopsy to the right arm that she had RN change dressing to. Stitches noted to be in place, no redness, swelling or signs of infection, wound is well approximated.  Biopsy area was cleaned with soap and water as per pt, then pts own petroleum dressing was put on and a gauze dressing placed over top with paper tape, as per pt. Pt also provided with meal tray and water at this time.  [facility] called and talked with RN there. RN stated they will not be able to accept her to the assisted living side till tomorrow at the earliest as social work will need to be involved. ER provider and pt notified.  Pt also has all home medications and have been taking them Provider aware and is okay with pt taking her personal home medications.

## 2020-02-15 NOTE — ED Notes (Signed)
Called and spoke to Tour manager) for [employer], she stated that for the patient to come to assisted status it would have to be an admission and social work would have to help with initiating the transfer. Patient is in independent living now.

## 2020-02-15 NOTE — ED Triage Notes (Signed)
Pt via EMS from the [facility] for a mechanical fall. Pt only c/o of L knee pain. On arrival, pt is A&Ox4 and NAD, L knee is swollen and per EMS deformity noted to the L knee.

## 2020-02-15 NOTE — ED Provider Notes (Signed)
[facility] Emergency Department Provider Note  ____________________________________________   I have reviewed the triage vital signs and the nursing notes.   HISTORY  Chief Complaint Fall   History limited by: Not Limited   HPI Michele Andrade is a 79 y.o. female who presents to the emergency department today because of concern for left knee pain after a fall. The patient was walking outside when she caught her foot on a landscaping barrier. She landed on the concrete with her left knee. She is complaining of significant pain to the left knee and swelling to the left knee. The patient denies hitting her head. Denies any other injury. She denies being on any blood thinners.   Records reviewed. Per medical record review patient has a history of HLD, HTN.   Past Medical History:  Diagnosis Date  . Breast cancer ([place]) 1999   right breast ca  . Erythrocytosis 09/30/2014  . Hypertension     Patient Active Problem List   Diagnosis Date Noted  . Breast CA ([place]) 12/17/2014  . Colon polyp 12/17/2014  . HLD (hyperlipidemia) 12/17/2014  . BP (high blood pressure) 12/17/2014  . Headache, migraine 12/17/2014  . Osteopenia 12/17/2014  . Erythrocytosis 09/30/2014  . Polycythemia 11/27/2013    Past Surgical History:  Procedure Laterality Date  . MASTECTOMY Right 1999  . rotator cuff      Prior to Admission medications   Medication Sig Start Date End Date Taking? Authorizing Provider  amLODipine (NORVASC) 2.5 MG tablet Take 1 tablet by mouth daily. 11/08/16   [provider]  aspirin 81 MG EC tablet Take 81 mg by mouth daily.     [provider]  Calcium Carbonate-Vitamin D 600-400 MG-UNIT per tablet Take 1 tablet by mouth 2 (two) times daily.     [provider]  cloNIDine (CATAPRES) 0.2 MG tablet Take 1 tablet by mouth 3 (three) times daily. 07/21/16   [provider]  Coenzyme Q10 (CO Q-10) 100 MG CAPS Take 100 mg by mouth  every morning.     [provider]  fluticasone (FLONASE) 50 MCG/ACT nasal spray Place 1 spray into both nostrils at bedtime. 07/22/19   [provider]  hydrALAZINE (APRESOLINE) 50 MG tablet Take 1 tablet by mouth 3 (three) times daily. 10/02/17   [provider]  lisinopril (PRINIVIL,ZESTRIL) 10 MG tablet Take 10 mg by mouth 3 (three) times daily.  06/03/14   [provider]  MAGNESIUM GLYCINATE PLUS PO Take 200 mg by mouth every evening.     [provider]  Multiple Vitamin (MULTI-VITAMINS) TABS Take 1 tablet by mouth 2 (two) times daily.     [provider]  Omega-3 Fatty Acids (FISH OIL) 1000 MG CAPS Take 1,000 mg by mouth 2 (two) times daily.     [provider]  propranolol ER (INDERAL LA) 80 MG 24 hr capsule Take 80 mg by mouth every morning. 11/07/15   [provider]  riboflavin (VITAMIN B-2) 100 MG TABS tablet Take 100 mg by mouth daily.     [provider]  TURMERIC PO Take 1 tablet by mouth 2 (two) times daily.    [provider]    Allergies Patient has no known allergies.  Family History  Problem Relation Age of Onset  . Breast cancer Mother 77    Social History Social History   Tobacco Use  . Smoking status: Never Smoker  . Smokeless tobacco: Never Used  Substance Use  Topics  . Alcohol use: Yes    Alcohol/week: 1.0 standard drink    Types: 1 Glasses of wine per week    Comment: BID prn  . Drug use: Not on file    Review of Systems Constitutional: No fever/chills Eyes: No visual changes. ENT: No sore throat. Cardiovascular: Denies chest pain. Respiratory: Denies shortness of breath. Gastrointestinal: No abdominal pain.  No nausea, no vomiting.  No diarrhea.   Genitourinary: Negative for dysuria. Musculoskeletal: Positive for left leg swelling and pain. Skin: Positive for abrasion to left knee.  Neurological: Negative for headaches, focal weakness or  numbness.  ____________________________________________   PHYSICAL EXAM:  VITAL SIGNS: ED Triage Vitals  Enc Vitals Group     BP 02/15/20 1729 (!) 180/84     Pulse Rate 02/15/20 1729 69     Resp 02/15/20 1729 18     Temp 02/15/20 1729 98 F (36.7 C)     Temp Source 02/15/20 1729 Oral     SpO2 02/15/20 1729 98 %     Weight 02/15/20 1718 143 lb (64.9 kg)     Height 02/15/20 1718 5\' 2"  (1.575 m)     Head Circumference --      Peak Flow --      Pain Score 02/15/20 1717 9    Constitutional: Alert and oriented.  Eyes: Conjunctivae are normal.  ENT      Head: Normocephalic and atraumatic.      Nose: No congestion/rhinnorhea.      Mouth/Throat: Mucous membranes are moist.      Neck: No stridor. Hematological/Lymphatic/Immunilogical: No cervical lymphadenopathy. Cardiovascular: Normal rate, regular rhythm.  No murmurs, rubs, or gallops.  Respiratory: Normal respiratory effort without tachypnea nor retractions. Breath sounds are clear and equal bilaterally. No wheezes/rales/rhonchi. Gastrointestinal: Soft and non tender. No rebound. No guarding.  Genitourinary: Deferred Musculoskeletal: Left knee with swelling and swelling to the medial distal portion of the thigh.  Neurologic:  Normal speech and language. No gross focal neurologic deficits are appreciated.  Skin:  Abrasion noted over knee.  Psychiatric: Mood and affect are normal. Speech and behavior are normal. Patient exhibits appropriate insight and judgment.  ____________________________________________    LABS (pertinent positives/negatives)  None  ____________________________________________   EKG  None  ____________________________________________    RADIOLOGY  Left knee No acute fracture or dislocation. Subcutaneous swelling.   ____________________________________________   PROCEDURES  Procedures  ____________________________________________   INITIAL IMPRESSION / ASSESSMENT AND PLAN / ED  COURSE  Pertinent labs & imaging results that were available during my care of the patient were reviewed by me and considered in my medical decision making (see chart for details).   Patient presented to the emergency department today because of concerns for left knee pain after mechanical fall.  Patient does have a slight abrasion to the knee and some swelling.  X-ray without acute fracture or dislocation.  Do think the patient's physical exam is consistent with a hematoma.  I discussed this with the patient.  Will have nursing staff dressed the wound.  [facility] [place]  Will see about getting her to facility there that can give her slightly more aid while the acute swelling and pain subsides.  ____________________________________________   FINAL CLINICAL IMPRESSION(S) / ED DIAGNOSES  Final diagnoses:  Fall, initial encounter  Hematoma  Acute pain of left knee     Note: This dictation was prepared with Dragon dictation. Any transcriptional errors that result from  this process are unintentional     Nance Pear, MD 02/15/20 2032

## 2020-02-15 NOTE — ED Notes (Addendum)
Pt states coming in due to a fall. Pt states she did not hit head or have LOC. Pt resting in bed. Ice applied to the left knee. Pt states she has no one that will be coming.  Pt states she took her 7pm home medications

## 2020-02-16 MED ORDER — ASPIRIN EC 81 MG PO TBEC
81.0000 mg | DELAYED_RELEASE_TABLET | Freq: Every day | ORAL | Status: DC
  Administered 2020-02-16 – 2020-02-17 (×2): 81 mg via ORAL
  Filled 2020-02-16 (×2): qty 1

## 2020-02-16 MED ORDER — AMLODIPINE BESYLATE 5 MG PO TABS
2.5000 mg | ORAL_TABLET | Freq: Every day | ORAL | Status: DC
  Filled 2020-02-16: qty 1

## 2020-02-16 MED ORDER — OMEGA-3-ACID ETHYL ESTERS 1 G PO CAPS
1000.0000 mg | ORAL_CAPSULE | Freq: Two times a day (BID) | ORAL | Status: DC
  Administered 2020-02-16 – 2020-02-17 (×2): 1000 mg via ORAL
  Filled 2020-02-16 (×4): qty 1

## 2020-02-16 MED ORDER — ACETAMINOPHEN 500 MG PO TABS: 1000.0000 mg | ORAL_TABLET | Freq: Once | ORAL | Status: AC

## 2020-02-16 MED ORDER — ADULT MULTIVITAMIN W/MINERALS CH
1.0000 | ORAL_TABLET | Freq: Two times a day (BID) | ORAL | Status: DC
  Administered 2020-02-16: 1 via ORAL
  Filled 2020-02-16 (×3): qty 1

## 2020-02-16 MED ORDER — ACETAMINOPHEN 500 MG PO TABS
ORAL_TABLET | ORAL | Status: AC
  Administered 2020-02-16: 1000 mg via ORAL
  Filled 2020-02-16: qty 2

## 2020-02-16 NOTE — NC FL2 (Deleted)
[place] LEVEL OF CARE SCREENING TOOL     IDENTIFICATION  Patient Name: Michele Andrade Birthdate: 08-07-1940 Sex: female Admission Date (Current Location): 02/15/2020  [place] and [place] Number:  Engineering geologist and Address:  [facility], [address] [place], [place] [address]      Provider Number: [id number]  Attending Physician Name and Address:  No att. providers found  Relative Name and Phone Number:  Bernarda Caffey, daughter, [phone]    Current Level of Care: Hospital Recommended Level of Care: [place] Prior Approval Number:    Date Approved/Denied:   PASRR Number: [id number]  Discharge Plan: SNF    Current Diagnoses: Patient Active Problem List   Diagnosis Date Noted  . Breast CA ([place]) 12/17/2014  . Colon polyp 12/17/2014  . HLD (hyperlipidemia) 12/17/2014  . BP (high blood pressure) 12/17/2014  . Headache, migraine 12/17/2014  . Osteopenia 12/17/2014  . Erythrocytosis 09/30/2014  . Polycythemia 11/27/2013    Orientation RESPIRATION BLADDER Height & Weight     Self, Time, Situation, Place  Normal Continent Weight: 143 lb (64.9 kg) Height:  5\' 2"  (157.5 cm)  BEHAVIORAL SYMPTOMS/MOOD NEUROLOGICAL BOWEL NUTRITION STATUS      Continent Diet (Heart Healthy)  AMBULATORY STATUS COMMUNICATION OF NEEDS Skin   Limited Assist Verbally Normal                       Personal Care Assistance Level of Assistance  Bathing, Feeding, Dressing Bathing Assistance: Limited assistance Feeding assistance: Limited assistance Dressing Assistance: Limited assistance     Functional Limitations Info  Sight, Hearing, Speech Sight Info: Adequate Hearing Info: Adequate Speech Info: Adequate    SPECIAL CARE FACTORS FREQUENCY  PT (By licensed PT)     PT Frequency: 3x              Contractures Contractures Info: Not present    Additional Factors Info  Code Status, Allergies Code Status  Info: Full Code Allergies Info: No known allergies           Current Medications (02/16/2020):  This is the current hospital active medication list No current facility-administered medications for this encounter.   Current Outpatient Medications  Medication Sig Dispense Refill  . amLODipine (NORVASC) 2.5 MG tablet Take 1 tablet by mouth at bedtime.     Marland Kitchen aspirin 81 MG EC tablet Take 81 mg by mouth daily.     . Calcium Carbonate-Vitamin D 600-400 MG-UNIT per tablet Take 1 tablet by mouth 2 (two) times daily.     . cloNIDine (CATAPRES) 0.2 MG tablet Take 1 tablet by mouth 3 (three) times daily.    . Coenzyme Q10 (CO Q-10) 100 MG CAPS Take 100 mg by mouth every morning.     . fluticasone (FLONASE) 50 MCG/ACT nasal spray Place 1 spray into both nostrils at bedtime.    . hydrALAZINE (APRESOLINE) 50 MG tablet Take 1 tablet by mouth 3 (three) times daily.    Marland Kitchen lisinopril (PRINIVIL,ZESTRIL) 10 MG tablet Take 10 mg by mouth 3 (three) times daily.     Marland Kitchen MAGNESIUM GLYCINATE PLUS PO Take 200 mg by mouth every evening.     . Multiple Vitamin (MULTI-VITAMINS) TABS Take 1 tablet by mouth 2 (two) times daily.     . Omega-3 Fatty Acids (FISH OIL) 1000 MG CAPS Take 1,000 mg by mouth 2 (two) times daily.     . propranolol ER (INDERAL LA) 80 MG  24 hr capsule Take 80 mg by mouth every morning.    . riboflavin (VITAMIN B-2) 100 MG TABS tablet Take 100 mg by mouth daily.     . TURMERIC PO Take 1 tablet by mouth 2 (two) times daily.       Discharge Medications: Please see discharge summary for a list of discharge medications.  Relevant Imaging Results:  Relevant Lab Results:   Additional Information [id number]  Berenice Bouton, LCSW

## 2020-02-16 NOTE — TOC Progression Note (Signed)
Transition of Care [facility]) - Progression Note    Patient Details  Name: Michele Andrade MRN: [id number] Date of Birth: [birth year]  Transition of Care [facility]) CM/SW [place], LCSW Phone Number: 02/16/2020, 12:24 PM  Clinical Narrative:   Social worker spoke to weekend facility administrator [phone] who noted that "we are aware that she needs SNF but we cannot take her today, Sunday."  Patient needs to be added to be added to their SNF system.  She will transport by ambulance on Monday, 02/17/2020.   Expected Discharge Plan: [place] Barriers to Discharge: SNF Pending bed offer  Expected Discharge Plan and Services Expected Discharge Plan: [place] In-house Referral: Clinical Social Work   Post Acute Care Choice: [place] Living arrangements for the past 2 months: [place] ([place])                                       Social Determinants of Health (SDOH) Interventions    Readmission Risk Interventions No flowsheet data found.

## 2020-02-16 NOTE — NC FL2 ([place] Needed)
[place] LEVEL OF CARE SCREENING TOOL     IDENTIFICATION  Patient Name: Michele Andrade Birthdate: 08-15-1940 Sex: female Admission Date (Current Location): 02/15/2020  [place] and [place] Number:  Engineering geologist and Address:  [facility], [address] [place], [place] [address]      Provider Number: [id number]  Attending Physician Name and Address:  No att. providers found  Relative Name and Phone Number:  Bernarda Caffey, daughter, [phone]    Current Level of Care: Hospital Recommended Level of Care: [place] Prior Approval Number:    Date Approved/Denied:   PASRR Number: [id number]  Discharge Plan: SNF    Current Diagnoses: Patient Active Problem List   Diagnosis Date Noted  . Breast CA ([place]) 12/17/2014  . Colon polyp 12/17/2014  . HLD (hyperlipidemia) 12/17/2014  . BP (high blood pressure) 12/17/2014  . Headache, migraine 12/17/2014  . Osteopenia 12/17/2014  . Erythrocytosis 09/30/2014  . Polycythemia 11/27/2013    Orientation RESPIRATION BLADDER Height & Weight     Self, Time, Situation, Place  Normal Continent Weight: 143 lb (64.9 kg) Height:  5\' 2"  (157.5 cm)  BEHAVIORAL SYMPTOMS/MOOD NEUROLOGICAL BOWEL NUTRITION STATUS      Continent Diet (Heart Healthy)  AMBULATORY STATUS COMMUNICATION OF NEEDS Skin   Limited Assist Verbally Normal                       Personal Care Assistance Level of Assistance  Bathing, Feeding, Dressing Bathing Assistance: Limited assistance Feeding assistance: Limited assistance Dressing Assistance: Limited assistance     Functional Limitations Info  Sight, Hearing, Speech Sight Info: Adequate Hearing Info: Adequate Speech Info: Adequate    SPECIAL CARE FACTORS FREQUENCY  PT (By licensed PT)     PT Frequency: 3x              Contractures Contractures Info: Not present    Additional Factors Info  Code Status, Allergies Code Status  Info: Full Code Allergies Info: No known allergies           Current Medications (02/16/2020):  This is the current hospital active medication list No current facility-administered medications for this encounter.   Current Outpatient Medications  Medication Sig Dispense Refill  . amLODipine (NORVASC) 2.5 MG tablet Take 1 tablet by mouth at bedtime.     Marland Kitchen aspirin 81 MG EC tablet Take 81 mg by mouth daily.     . Calcium Carbonate-Vitamin D 600-400 MG-UNIT per tablet Take 1 tablet by mouth 2 (two) times daily.     . cloNIDine (CATAPRES) 0.2 MG tablet Take 1 tablet by mouth 3 (three) times daily.    . Coenzyme Q10 (CO Q-10) 100 MG CAPS Take 100 mg by mouth every morning.     . fluticasone (FLONASE) 50 MCG/ACT nasal spray Place 1 spray into both nostrils at bedtime.    . hydrALAZINE (APRESOLINE) 50 MG tablet Take 1 tablet by mouth 3 (three) times daily.    Marland Kitchen lisinopril (PRINIVIL,ZESTRIL) 10 MG tablet Take 10 mg by mouth 3 (three) times daily.     Marland Kitchen MAGNESIUM GLYCINATE PLUS PO Take 200 mg by mouth every evening.     . Multiple Vitamin (MULTI-VITAMINS) TABS Take 1 tablet by mouth 2 (two) times daily.     . Omega-3 Fatty Acids (FISH OIL) 1000 MG CAPS Take 1,000 mg by mouth 2 (two) times daily.     . propranolol ER (INDERAL LA) 80 MG  24 hr capsule Take 80 mg by mouth every morning.    . riboflavin (VITAMIN B-2) 100 MG TABS tablet Take 100 mg by mouth daily.     . TURMERIC PO Take 1 tablet by mouth 2 (two) times daily.       Discharge Medications: Please see discharge summary for a list of discharge medications.  Relevant Imaging Results:  Relevant Lab Results:   Additional Information [id number]  Berenice Bouton, LCSW

## 2020-02-16 NOTE — ED Notes (Signed)
Pt given lunch tray. Pt resting in bed. No further needs expressed at this time.

## 2020-02-16 NOTE — ED Notes (Addendum)
Pt is A&Ox4, sitting in hospital bed in hallway resting at this time.  Meal tray bedside.  Pt requested a change of dressing on left knee and right wrist tonight which is planned for later in the evening, ambulation discussed, returning to [facility] discussed.  Peripheral IV in left arm flushed with NS and patency confirmed.  No needs expressed at this time.

## 2020-02-16 NOTE — TOC Initial Note (Signed)
Transition of Care [facility]) - Initial/Assessment Note    Patient Details  Name: Michele Andrade MRN: [id number] Date of Birth: Jun 20, 1940  Transition of Care [facility]) CM/SW Contact:    Berenice Bouton, LCSW Phone Number: 02/16/2020, 12:04 PM  Clinical Narrative:       The patient is a 79 year old woman who presented to [facility] ED from the [facility].  Patient has been living at [facility] for over 10 years. She noted that she has 1 daughter that lives out of town.  Patient stated that she is interested in SNF at the [place].  SW explained to the patient that [facility] team will prepare paperwork and send out bed offer requests.    PASARR [id number] A FL2 completed    Expected Discharge Plan: [place] Barriers to Discharge: SNF Pending bed offer   Patient Goals and CMS Choice Patient states their goals for this hospitalization and ongoing recovery are:: Patient wants to get well "so I could go back [place]" ALF Agreable to SNF. Requested [facility] SNF CMS Medicare.gov Compare Post Acute Care list provided to:: Patient Choice offered to / list presented to : Patient  Expected Discharge Plan and Services Expected Discharge Plan: [place] In-house Referral: Clinical Social Work   Post Acute Care Choice: [place] Living arrangements for the past 2 months: [place] ([place] [place], [employer])                                      Prior Living Arrangements/Services Living arrangements for the past 2 months: [place] ([place], [employer]) Lives with:: Self, Facility Resident Patient language and need for interpreter reviewed:: No Do you feel safe going back to the place where you live?: Yes      Need for Family Participation in Patient Care: No (Comment) Care giver support system in place?: Yes (comment)   Criminal  Activity/Legal Involvement Pertinent to Current Situation/Hospitalization: No - Comment as needed  Activities of Daily Living      Permission Sought/Granted Permission sought to share information with : Facility Sport and exercise psychologist, Case Manager Permission granted to share information with : Yes, Verbal Permission Granted  Share Information with NAME: Bernarda Caffey, daughter, [phone]  Permission granted to share info w AGENCY: SNF and ALF        Emotional Assessment Appearance:: Appears older than stated age   Affect (typically observed): Accepting, Calm Orientation: : Oriented to Self, Oriented to Place, Oriented to  Time, Oriented to Situation Alcohol / Substance Use: Not Applicable Psych Involvement: No (comment)  Admission diagnosis:  Fall Patient Active Problem List   Diagnosis Date Noted  . Breast CA ([place]) 12/17/2014  . Colon polyp 12/17/2014  . HLD (hyperlipidemia) 12/17/2014  . BP (high blood pressure) 12/17/2014  . Headache, migraine 12/17/2014  . Osteopenia 12/17/2014  . Erythrocytosis 09/30/2014  . Polycythemia 11/27/2013   PCP:  Idelle Crouch, MD Pharmacy:   [place], [place] - [address] [facility] [address] [place] [address] Phone: [phone] Fax: [phone]  [place], [place] - [address] [address] Phone: [phone] Fax: [phone]     Social Determinants of Health (SDOH) Interventions    Readmission Risk Interventions No flowsheet data found.

## 2020-02-16 NOTE — Social Work (Signed)
TOC CM/SW assessment in progress.  PT recommended SNF.  Will discuss w/pt and family.

## 2020-02-16 NOTE — ED Provider Notes (Signed)
Patient ambulating well with stable gait with the assistance of physical therapist. She seems to be using her walker well. No distress. Pleasant.  Vitals:   02/15/20 1729 02/15/20 2013  BP: (!) 180/84 132/73  Pulse: 69 (!) 48  Resp: 18   Temp: 98 F (36.7 C)   SpO2: 98% 98%      Delman Kitten, MD 02/16/20 [id number]

## 2020-02-16 NOTE — ED Notes (Signed)
Pt ambulating in the hallway with PT

## 2020-02-16 NOTE — ED Notes (Signed)
Dressing changed on left knee, wrapped with compression bandage.  Dressing changed on right wrist.

## 2020-02-16 NOTE — ED Notes (Signed)
Helped pt to void on bedpan. Provided water to pt. No further needs expressed at this time.

## 2020-02-16 NOTE — ED Notes (Signed)
Pt has been asking to have her peripheral IV removed.  Dr. Corky Downs approved the removal at this time.

## 2020-02-16 NOTE — ED Notes (Signed)
Pt ambulated in hall with walker and standby assistance

## 2020-02-16 NOTE — ED Notes (Signed)
Breakfast tray provided to pt.

## 2020-02-16 NOTE — ED Notes (Signed)
Report received from [place], Rn at this time. Pt being assisted with bed pan by Eugene Garnet, RN at this time.

## 2020-02-16 NOTE — ED Notes (Signed)
Pt assisted with bed pan

## 2020-02-16 NOTE — Evaluation (Signed)
Physical Therapy Evaluation Patient Details Name: Michele Andrade MRN: [id number] DOB: May 04, 1941 Today's Date: 02/16/2020   History of Present Illness  Patient is a 79 y.o. female who presents to the emergency department today because of concern for left knee pain after a fall while walking outside. Found to have knee abrasion and swelling with hematoma without fracture or dislocation. Past medical history of breast cancer, hypertension. Patient currently lives in independent living facility   Clinical Impression  Patient is pleasant and cooperative with physical therapy efforts. Patient is motivated to regain independence as she lives alone and was active and independent with mobility prior to the fall. Patient complains of mild left knee and left posterior buttock pain during mobility efforts that impacts functional independence. Patient needs Mod A for bed mobility and Min A for sit to stand transfers. Min guard assistance provided during ambulation efforts with gait training cues provided for reciprocal and heel toe gait pattern using rolling walker. Patient lives alone and feels that she would be unsafe to manage at home at this time given current mobility status. Recommend PT to maximize independence and address functional limitations listed below. SNF is recommended at discharge at this time.      Follow Up Recommendations SNF    Equipment Recommendations  None recommended by PT    Recommendations for Other Services       Precautions / Restrictions Precautions Precautions: Fall Restrictions Weight Bearing Restrictions: No      Mobility  Bed Mobility Overal bed mobility: Needs Assistance Bed Mobility: Supine to Sit;Sit to Supine     Supine to sit: Mod assist Sit to supine: Mod assist   General bed mobility comments: assistance mostly for LLE support. intermittent support for trunk to sit upright. head of bed elevated minimally and intermittent use of bed rails required to  facilitate independence and less physical assistance from therapist. extra time required due to pain   Transfers Overall transfer level: Needs assistance Equipment used: Rolling walker (2 wheeled) Transfers: Sit to/from Stand Sit to Stand: Min assist         General transfer comment: verbal cues for LLE positioning. Min A for lifting required to stand   Ambulation/Gait Ambulation/Gait assistance: Min guard Gait Distance (Feet): 150 Feet Assistive device: Rolling walker (2 wheeled) Gait Pattern/deviations: Step-to pattern;Step-through pattern Gait velocity: decreased    General Gait Details: step to pattern initially progressing to step through gait pattern with increased gait distance and cues for technique. cues also provided for heel toe movement and technique using rolling walker   Stairs            Wheelchair Mobility    Modified Rankin (Stroke Patients Only)       Balance Overall balance assessment: Needs assistance Sitting-balance support: Feet supported Sitting balance-Leahy Scale: Good     Standing balance support: Bilateral upper extremity supported;During functional activity Standing balance-Leahy Scale: Fair Standing balance comment: patient relying heavily on rolling walker for UE support                              Pertinent Vitals/Pain Pain Assessment: Faces Faces Pain Scale: Hurts a little bit Pain Location: left posterior buttocks and left knee   Pain Descriptors / Indicators: Sore Pain Intervention(s): Limited activity within patient's tolerance    Home Living Family/patient expects to be discharged to:: Private residence Living Arrangements: Alone Available Help at Discharge:  (no family available to help )  Type of Home: Independent living facility Home Access: Level entry     Home Layout: One level Home Equipment: [place] - 2 wheels;Shower seat      Prior Function Level of Independence: Independent          Comments: patient enjoys working out at [employer] and at the pool at her facility      Hand Dominance   Dominant Hand: Right    Extremity/Trunk Assessment   Upper Extremity Assessment Upper Extremity Assessment: Overall WFL for tasks assessed    Lower Extremity Assessment Lower Extremity Assessment: LLE deficits/detail LLE Deficits / Details: patient guarding with knee ROM intermittently. no knee buckling with weight bearing        Communication   Communication: No difficulties  Cognition Arousal/Alertness: Awake/alert Behavior During Therapy: WFL for tasks assessed/performed Overall Cognitive Status: Within Functional Limits for tasks assessed                                        General Comments      Exercises     Assessment/Plan    PT Assessment Patient needs continued PT services  PT Problem List Decreased activity tolerance;Decreased range of motion;Decreased mobility;Decreased knowledge of use of DME;Decreased safety awareness;Decreased knowledge of precautions;Pain       PT Treatment Interventions DME instruction;Gait training;Stair training;Functional mobility training;Therapeutic activities;Therapeutic exercise;Balance training;Neuromuscular re-education;Patient/family education    PT Goals (Current goals can be found in the Care Plan section)  Acute Rehab PT Goals Patient Stated Goal: to go to rehab  PT Goal Formulation: With patient Time For Goal Achievement: 03/01/20 Potential to Achieve Goals: Good    Frequency Min 2X/week   Barriers to discharge Decreased caregiver support patient does not have family to assist at discharge and reports she would be alone at her home     Co-evaluation               AM-PAC PT "6 Clicks" Mobility  Outcome Measure Help needed turning from your back to your side while in a flat bed without using bedrails?: A Little Help needed moving from lying on your back to sitting on the side of a flat  bed without using bedrails?: A Lot Help needed moving to and from a bed to a chair (including a wheelchair)?: A Little Help needed standing up from a chair using your arms (e.g., wheelchair or bedside chair)?: A Little Help needed to walk in hospital room?: A Little Help needed climbing 3-5 steps with a railing? : A Lot 6 Click Score: 16    End of Session Equipment Utilized During Treatment: Gait belt Activity Tolerance: Patient tolerated treatment well Patient left: in bed;with call bell/phone within reach;with bed alarm set   PT Visit Diagnosis: Other abnormalities of gait and mobility (R26.89);History of falling (Z91.81);Difficulty in walking, not elsewhere classified (R26.2);Pain Pain - Right/Left: Left Pain - part of body: Knee (and posterior buttocks)    Time: 2409-7353 PT Time Calculation (min) (ACUTE ONLY): 44 min   Charges:   PT Evaluation $PT Eval Moderate Complexity: 1 Mod PT Treatments $Gait Training: 23-37 mins        Minna Merritts, PT, MPT   Percell Locus 02/16/2020, 10:13 AM

## 2020-02-17 ENCOUNTER — Encounter
Admission: RE | Admit: 2020-02-17 | Discharge: 2020-02-17 | Disposition: A | Discharge status: Home / Self Care | Payer: Medicare Other | Source: Ambulatory Visit | Attending: Internal Medicine | Admitting: Internal Medicine

## 2020-02-17 LAB — RESPIRATORY PANEL BY RT PCR (FLU A&B, COVID)
Influenza A by PCR: NEGATIVE
Influenza B by PCR: NEGATIVE
SARS Coronavirus 2 by RT PCR: NEGATIVE

## 2020-02-17 MED ORDER — ACETAMINOPHEN 325 MG PO TABS: 650.0000 mg | ORAL_TABLET | Freq: Once | ORAL | Status: AC

## 2020-02-17 MED ORDER — ACETAMINOPHEN 325 MG PO TABS
ORAL_TABLET | ORAL | Status: AC
  Administered 2020-02-17: 650 mg via ORAL
  Filled 2020-02-17: qty 2

## 2020-02-17 MED ORDER — ACETAMINOPHEN 325 MG PO TABS
650.0000 mg | ORAL_TABLET | Freq: Once | ORAL | Status: AC
  Administered 2020-02-17: 650 mg via ORAL
  Filled 2020-02-17: qty 2

## 2020-02-17 NOTE — ED Notes (Signed)
Hourly rounding completed at this time, patient currently awake in hallway bed. No complaints, stable, and in no acute distress. Tylenol admin at this time.

## 2020-02-17 NOTE — ED Notes (Signed)
Assisted pt to use bed pan.

## 2020-02-17 NOTE — ED Notes (Signed)
Pt complained of pt to Rebekah, RN in left knee, MD notified and orders to follow.

## 2020-02-17 NOTE — Progress Notes (Signed)
Physical Therapy Treatment Patient Details Name: Michele Andrade MRN: [id number] DOB: 23-Feb-1941 Today's Date: 02/17/2020    History of Present Illness Patient is a 79 y.o. female who presents to the emergency department today because of concern for left knee pain after a fall while walking outside. Found to have knee abrasion and swelling with hematoma without fracture or dislocation. Past medical history of breast cancer, hypertension. Patient currently lives in independent living facility     PT Comments    Pt sitting upright EOB upon arrival and eager to ambulate.  Pt performed gown changed with no UE support sitting EOB.  Pt able to transfer from EOB to standing without much difficulty.  Pt required FWW and minA for therapist support on FWW.  Pt then proceeded to perform weight shifts and standing marches prior to ambulating down ED hallway.  Pt then proceeded to ambulate down hallway with CGA.  Pt started with step-to pattern, however was able to progress to step-through pattern as ambulation continued.  Pt then ambulated back to bed and sat EOB waiting on nursing to change bedding sheets.  Pt recommended to be d/c with FWW for ambulation due ot not having functional walker at home.  Current discharge plans to SNF remain appropriate at this time.  Pt will continue to benefit from skilled therapy in order to address deficits listed below.    Follow Up Recommendations  SNF     Equipment Recommendations  Rolling walker with 5" wheels    Recommendations for Other Services       Precautions / Restrictions Precautions Precautions: Fall Restrictions Weight Bearing Restrictions: No    Mobility  Bed Mobility               General bed mobility comments: Pt seated EOB upon arrival and left sitting EOB at conclusion of session.  Transfers Overall transfer level: Needs assistance Equipment used: Rolling walker (2 wheeled) Transfers: Sit to/from Stand Sit to Stand: Min assist          General transfer comment: Elevated surface and use of FWW to come upright.  Ambulation/Gait Ambulation/Gait assistance: Min guard Gait Distance (Feet): 200 Feet Assistive device: Rolling walker (2 wheeled) Gait Pattern/deviations: Step-to pattern;Step-through pattern;Decreased step length - right;Decreased stance time - left Gait velocity: decreased    General Gait Details: Pt started with step-to pattern and progressed to step through gait pattern after ambulating for ~70ft.   Stairs             Wheelchair Mobility    Modified Rankin (Stroke Patients Only)       Balance Overall balance assessment: Needs assistance Sitting-balance support: Feet supported Sitting balance-Leahy Scale: Good     Standing balance support: No upper extremity supported Standing balance-Leahy Scale: Good Standing balance comment: Pt able to stand at EOB with no UE support.                            Cognition Arousal/Alertness: Awake/alert Behavior During Therapy: WFL for tasks assessed/performed Overall Cognitive Status: Within Functional Limits for tasks assessed                                        Exercises Other Exercises Other Exercises: Pt received education and gait training for helpful transfer methods and proper gait mechanics when utilizing FWW.    General Comments  Pertinent Vitals/Pain Pain Assessment: Faces Faces Pain Scale: Hurts a little bit Pain Location: left posterior buttocks and left knee   Pain Descriptors / Indicators: Sore Pain Intervention(s): Limited activity within patient's tolerance;Monitored during session;Repositioned    Home Living                      Prior Function            PT Goals (current goals can now be found in the care plan section) Acute Rehab PT Goals Patient Stated Goal: to go to rehab  PT Goal Formulation: With patient Time For Goal Achievement: 03/01/20 Potential to  Achieve Goals: Good Progress towards PT goals: Progressing toward goals    Frequency    Min 2X/week      PT Plan Current plan remains appropriate    Co-evaluation              AM-PAC PT "6 Clicks" Mobility   Outcome Measure  Help needed turning from your back to your side while in a flat bed without using bedrails?: A Little Help needed moving from lying on your back to sitting on the side of a flat bed without using bedrails?: A Little Help needed moving to and from a bed to a chair (including a wheelchair)?: A Little Help needed standing up from a chair using your arms (e.g., wheelchair or bedside chair)?: A Little Help needed to walk in hospital room?: A Little Help needed climbing 3-5 steps with a railing? : A Lot 6 Click Score: 17    End of Session Equipment Utilized During Treatment: Gait belt Activity Tolerance: Patient tolerated treatment well Patient left: in bed;with call bell/phone within reach;with nursing/sitter in room Nurse Communication: Mobility status PT Visit Diagnosis: Other abnormalities of gait and mobility (R26.89);History of falling (Z91.81);Difficulty in walking, not elsewhere classified (R26.2);Pain Pain - Right/Left: Left Pain - part of body: Knee (and posterior buttocks)     Time: 5498-2641 PT Time Calculation (min) (ACUTE ONLY): 24 min  Charges:  $Therapeutic Exercise: 23-37 mins                     Gwenlyn Saran, PT, DPT 02/17/20, 10:54 AM

## 2020-02-17 NOTE — TOC Transition Note (Signed)
Transition of Care [facility]) - CM/SW Discharge Note   Patient Details  Name: [name] MRN: [id number] Date of Birth: [birth year]  Transition of Care [facility]) CM/SW Contact:  Ova Freshwater Phone Number: [phone] 02/17/2020, 4:07 PM   Clinical Narrative:     Patient will d/c to [facility], Room#355, Report# [phone]. EDP/ED Staff notified. Joelene Millin from [facility] updated. TOC consult complete.  Final next level of care: Skilled Nursing Facility Barriers to Discharge: SNF Pending bed offer   Patient Goals and CMS Choice Patient states their goals for this hospitalization and ongoing recovery are:: Patient wants to get well "so I could go back to my cottage" ALF Agreable to SNF. Requested [facility] SN[facility] CMS Medicare.gov Compare Post Acute Care list provided to:: Patient Choice offered to / list presented to : Patient  Discharge Placement                       Discharge Plan and Services In-house Referral: Clinical Social Work   Post Acute Care Choice: [place]                               Social Determinants of Health (SDOH) Interventions     Readmission Risk Interventions No flowsheet data found.

## 2020-02-17 NOTE — ED Notes (Signed)
Hourly rounding completed at this time, patient currently asleep in hallway bed. No complaints, stable, and in no acute distress.

## 2020-02-17 NOTE — TOC Progression Note (Signed)
Transition of Care [facility]) - Progression Note    Patient Details  Name: Michele Andrade MRN: [id number] Date of Birth: [birth year]  Transition of Care [facility]) CM/SW [place], [place] Phone Number: [phone] 02/17/2020, 9:47 AM  Clinical Narrative:    Patient pending d/c to [facility], needs rapid COVID and insurance authorization.  CSW requested rapid COVID EDP/ED Staff notified.   Expected Discharge Plan: [place] Barriers to Discharge: SNF Pending bed offer  Expected Discharge Plan and Services Expected Discharge Plan: [place] In-house Referral: Clinical Social Work   Post Acute Care Choice: [place] Living arrangements for the past 2 months: [place] ([place])                                       Social Determinants of Health (SDOH) Interventions    Readmission Risk Interventions No flowsheet data found.

## 2020-02-17 NOTE — TOC Progression Note (Addendum)
Transition of Care [facility]) - Progression Note    Patient Details  Name: Michele Andrade MRN: [id number] Date of Birth: [birth year]  Transition of Care [facility]) CM/SW [place], [place] Phone Number: [phone] 02/17/2020, 12:59 PM  Clinical Narrative:     Insurance auth REF# [id number], pending insurance auth for d/c.  Expected Discharge Plan: [place] Barriers to Discharge: SNF Pending bed offer  Expected Discharge Plan and Services Expected Discharge Plan: [place] In-house Referral: Clinical Social Work   Post Acute Care Choice: [place] Living arrangements for the past 2 months: [place] ([place])                                       Social Determinants of Health (SDOH) Interventions    Readmission Risk Interventions No flowsheet data found.

## 2020-02-17 NOTE — ED Notes (Signed)
[facility] called for transport to [place]

## 2020-02-17 NOTE — TOC Progression Note (Signed)
Transition of Care [facility]) - Progression Note    Patient Details  Name: Michele Andrade MRN: [id number] Date of Birth: 1940/11/06  Transition of Care [facility]) CM/SW [place], RN Phone Number: 02/17/2020, 12:01 PM  Clinical Narrative:     Insurance auth faxed to [employer]  At [phone]   Expected Discharge Plan: [place] Barriers to Discharge: SNF Pending bed offer  Expected Discharge Plan and Services Expected Discharge Plan: [place] In-house Referral: Clinical Social Work   Post Acute Care Choice: [place] Living arrangements for the past 2 months: [place] ([place])                                       Social Determinants of Health (SDOH) Interventions    Readmission Risk Interventions No flowsheet data found.

## 2020-02-17 NOTE — TOC Progression Note (Signed)
Transition of Care [facility]) - Progression Note    Patient Details  Name: Michele Andrade MRN: [id number] Date of Birth: [birth year]  Transition of Care [facility]) CM/SW [place], [place] Phone Number: [phone] 02/17/2020, 3:29 PM  Clinical Narrative:     CSW spoke with Joelene Millin at [place] and she stated she is able to take the patient with her respite days, but the patient would be financially responsible for medicines and supplies.   Joelene Millin stated the patient has 14 respite days available and she could use them until insurance authorized.  Expected Discharge Plan: [place] Barriers to Discharge: SNF Pending bed offer  Expected Discharge Plan and Services Expected Discharge Plan: [place] In-house Referral: Clinical Social Work   Post Acute Care Choice: [place] Living arrangements for the past 2 months: [place] ([place])                                       Social Determinants of Health (SDOH) Interventions    Readmission Risk Interventions No flowsheet data found.

## 2020-02-17 NOTE — Discharge Instructions (Addendum)
Please seek medical attention for any high fevers, chest pain, shortness of breath, change in behavior, persistent vomiting, bloody stool or any other new or concerning symptoms.  

## 2020-02-27 ENCOUNTER — Telehealth: Payer: Self-pay | Admitting: *Deleted

## 2020-02-27 NOTE — Telephone Encounter (Signed)
Call returned to [facility] and had to leave message on voice mail. VERBAL ORDER left for CBC, BMP and to fax results to [place] prior to 03/04/20 appointment and left my call back number

## 2020-02-27 NOTE — Telephone Encounter (Signed)
Pam at [facility] called reporting that patient has an appointment for lab and possible phlebotomy and is asking if they could drawn her lab there to see if she will need a phlebotomy. Please advise

## 2020-02-27 NOTE — Telephone Encounter (Signed)
Gregor Hams with me- please have the labs faxed over to [place]. Thanks GB

## 2020-03-03 ENCOUNTER — Other Ambulatory Visit
Admission: RE | Admit: 2020-03-03 | Discharge: 2020-03-03 | Disposition: A | Discharge status: Home / Self Care | Payer: Medicare Other | Source: Skilled Nursing Facility | Attending: Internal Medicine | Admitting: Internal Medicine

## 2020-03-03 ENCOUNTER — Other Ambulatory Visit: Payer: Self-pay

## 2020-03-03 DIAGNOSIS — D751 Secondary polycythemia: Secondary | ICD-10-CM

## 2020-03-03 LAB — BASIC METABOLIC PANEL
Anion gap: 9 (ref 5–15)
BUN: 30 mg/dL — ABNORMAL HIGH (ref 8–23)
CO2: 25 mmol/L (ref 22–32)
Calcium: 9.8 mg/dL (ref 8.9–10.3)
Chloride: 106 mmol/L (ref 98–111)
Creatinine, Ser: 0.84 mg/dL (ref 0.44–1.00)
GFR, Estimated: >60 mL/min (ref >60)
Glucose, Bld: 98 mg/dL (ref 70–99)
Potassium: 4.3 mmol/L (ref 3.5–5.1)
Sodium: 140 mmol/L (ref 135–145)

## 2020-03-03 LAB — CBC WITH DIFFERENTIAL/PLATELET
Abs Immature Granulocytes: 0.03 10*3/uL (ref 0.00–0.07)
Basophils Absolute: 0 10*3/uL (ref 0.0–0.1)
Basophils Relative: 1 %
Eosinophils Absolute: 0.2 10*3/uL (ref 0.0–0.5)
Eosinophils Relative: 2 %
HCT: 42.1 % (ref 36.0–46.0)
Hemoglobin: 13.6 g/dL (ref 12.0–15.0)
Immature Granulocytes: 0 %
Lymphocytes Relative: 25 %
Lymphs Abs: 1.8 10*3/uL (ref 0.7–4.0)
MCH: 30.6 pg (ref 26.0–34.0)
MCHC: 32.3 g/dL (ref 30.0–36.0)
MCV: 94.6 fL (ref 80.0–100.0)
Monocytes Absolute: 0.7 10*3/uL (ref 0.1–1.0)
Monocytes Relative: 10 %
Neutro Abs: 4.4 10*3/uL (ref 1.7–7.7)
Neutrophils Relative %: 62 %
Platelets: 244 10*3/uL (ref 150–400)
RBC: 4.45 MIL/uL (ref 3.87–5.11)
RDW: 13.9 % (ref 11.5–15.5)
WBC: 7.2 10*3/uL (ref 4.0–10.5)
nRBC: 0 % (ref 0.0–0.2)

## 2020-03-04 ENCOUNTER — Inpatient Hospital Stay: Payer: Medicare Other

## 2020-03-04 ENCOUNTER — Inpatient Hospital Stay: Payer: Medicare Other | Attending: Internal Medicine | Admitting: Internal Medicine

## 2020-03-04 ENCOUNTER — Other Ambulatory Visit: Payer: Medicare Other

## 2020-03-04 NOTE — Assessment & Plan Note (Deleted)
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  Hold phlebotomy today hematocrit 45.  # Elevated blood pressure-110-140-improved.  on polypharmacy [Dr.Lateef/Dr.Spark]; phlebotomy seems to help Blood pressures.   # CKD- cGFR 48- reocmmend follow up with Dr.Lateef.   #Prediabetes-recommend evaluation with nutrition/[place].   # DISPOSITION:  # Joli/ Nutrition appt re: pre-diabetes/  # HOLD Phlebotomy today # H&H; BMP/possible phlebotomy Q 8 weeks [pt pref]; no one blood appts.  # follow up in 4 months-  MD/ labs-cbc/bmp possible phlebotomy-Dr.B  Cc; Dr.Lateef/Dr.Sparks

## 2020-04-07 ENCOUNTER — Telehealth: Payer: Self-pay | Admitting: *Deleted

## 2020-04-07 ENCOUNTER — Other Ambulatory Visit: Payer: Self-pay | Admitting: Internal Medicine

## 2020-04-07 NOTE — Telephone Encounter (Addendum)
Patient called questioning if a phlebotomy would interfere with her healing hematoma on her knee which is still swollen and stiff. She is out of SNF and home at [place] and still getting PHYSICAL THERAPY. She did go for lab check with her PCP yesterday and her HCT is up to 48.5. She does not currently have any follow up with Dr B or lab/ phlebotomy as she missed her last appointment due to her fall and subsequent hematoma. She is asking if she needs to be seen or just monitored her labs. She requests if we are going to just monitor her labs that an order be sent to [facility] to draw it. She has multiple appts coming up with Ortho PCP, and PHYSICAL THERAPY. Please advise  Care Everywhere Labs History Expand AllCollapse All CBC [place] 04/06/2020 Component  Component 04/06/2020 12/04/2019 09/03/2019 05/31/2019 11/26/2018 08/20/2018 05/29/2018 01/24/2018 07/11/2017               WBC (White Blood Cell Count) 6.0 5.6 7.3 6.5 6.0 6.6 6.4 5.6 6.6 Load older lab results  RBC (Red Blood Cell Count) 5.17 4.95 5.32 4.81 4.85 4.79 5.33 5.15 5.32 Load older lab results  Hemoglobin 15.7High   15.0 15.6High   14.0 14.3 14.9 16.4High   15.7High   16.4High   Load older lab results  Hematocrit 48.5High   45.9 48.5High   44.3 43.7 45.4 50.2High   49.1High   49.9High   Load older lab results  MCV (Mean Corpuscular Volume) 93.8 92.7 91.2 92.1 90.1 94.8 94.2 95.3 93.8 Load older lab results  MCH (Mean Corpuscular Hemoglobin) 30.4 30.3 29.3 29.1 29.5 31.1 30.8 30.5 30.8 Load older lab results  MCHC (Mean Corpuscular Hemoglobin Concentration) 32.4 32.7 32.2 31.6Low   32.7 32.8 32.7 32.0 32.9 Load older lab results  RDW-CV (Red Cell Distribution Width) 13.0 14.3 13.9 13.5 13.2 12.5 12.8 12.3 12.7 Load older lab results  MPV (Mean Platelet Volume) 10.7 11.0 11.5 11.1 11.2 10.9 10.9 11.0 10.9 Load older lab results  Immature Granulocyte  Count 0.01 0.01 0.01 0.01 0.01 0.01

## 2020-04-07 NOTE — Progress Notes (Signed)
Reviewed the labs from [facility] [place] [facility] [place]- HCT 74; ok to proceed with phlebotomy. [ phlebotomy if HCT >45]  Plan to continue monitor cbc q 3 months [[place]].

## 2020-04-07 NOTE — Progress Notes (Signed)
Spoke with patient. Apts given to have therapeutic phlebotomy on 04/17/20. No additional labs per md. (patient's preference to come on 12/10 due to multiple PT apts and apts with PCP.)  Patient doesn't want to come in 3 months. She prefers 2 months. Per her requests- apts scheduled for lab/md/possible phlebotomy. Patient request to come on 06/13/19 at 1:00 pm. Apts provided.

## 2020-04-17 ENCOUNTER — Other Ambulatory Visit: Payer: Self-pay

## 2020-04-17 ENCOUNTER — Inpatient Hospital Stay: Payer: Medicare Other | Attending: Internal Medicine

## 2020-04-17 DIAGNOSIS — D751 Secondary polycythemia: Secondary | ICD-10-CM | POA: Diagnosis not present

## 2020-04-17 DIAGNOSIS — Z9221 Personal history of antineoplastic chemotherapy: Secondary | ICD-10-CM | POA: Diagnosis not present

## 2020-04-17 DIAGNOSIS — N189 Chronic kidney disease, unspecified: Secondary | ICD-10-CM | POA: Diagnosis not present

## 2020-04-17 DIAGNOSIS — Z853 Personal history of malignant neoplasm of breast: Secondary | ICD-10-CM | POA: Insufficient documentation

## 2020-04-17 DIAGNOSIS — R7303 Prediabetes: Secondary | ICD-10-CM | POA: Insufficient documentation

## 2020-04-17 NOTE — Progress Notes (Signed)
Patient tolerated phlebotomy well without any complications. Vital signs stable.

## 2020-06-12 ENCOUNTER — Encounter: Payer: Self-pay | Admitting: Internal Medicine

## 2020-06-12 ENCOUNTER — Other Ambulatory Visit: Payer: Self-pay | Admitting: *Deleted

## 2020-06-12 ENCOUNTER — Inpatient Hospital Stay: Payer: Medicare Other | Attending: Internal Medicine

## 2020-06-12 ENCOUNTER — Inpatient Hospital Stay: Payer: Medicare Other

## 2020-06-12 ENCOUNTER — Inpatient Hospital Stay (HOSPITAL_BASED_OUTPATIENT_CLINIC_OR_DEPARTMENT_OTHER): Payer: Medicare Other | Admitting: Internal Medicine

## 2020-06-12 DIAGNOSIS — Z79899 Other long term (current) drug therapy: Secondary | ICD-10-CM | POA: Diagnosis not present

## 2020-06-12 DIAGNOSIS — D751 Secondary polycythemia: Secondary | ICD-10-CM | POA: Insufficient documentation

## 2020-06-12 DIAGNOSIS — I10 Essential (primary) hypertension: Secondary | ICD-10-CM | POA: Diagnosis not present

## 2020-06-12 DIAGNOSIS — M25562 Pain in left knee: Secondary | ICD-10-CM | POA: Insufficient documentation

## 2020-06-12 DIAGNOSIS — Z803 Family history of malignant neoplasm of breast: Secondary | ICD-10-CM | POA: Diagnosis not present

## 2020-06-12 DIAGNOSIS — Z7982 Long term (current) use of aspirin: Secondary | ICD-10-CM | POA: Insufficient documentation

## 2020-06-12 LAB — CBC WITH DIFFERENTIAL/PLATELET
Abs Immature Granulocytes: 0.02 10*3/uL (ref 0.00–0.07)
Basophils Absolute: 0 10*3/uL (ref 0.0–0.1)
Basophils Relative: 0 %
Eosinophils Absolute: 0.1 10*3/uL (ref 0.0–0.5)
Eosinophils Relative: 1 %
HCT: 46.7 % — ABNORMAL HIGH (ref 36.0–46.0)
Hemoglobin: 15.8 g/dL — ABNORMAL HIGH (ref 12.0–15.0)
Immature Granulocytes: 0 %
Lymphocytes Relative: 23 %
Lymphs Abs: 1.7 10*3/uL (ref 0.7–4.0)
MCH: 30.4 pg (ref 26.0–34.0)
MCHC: 33.8 g/dL (ref 30.0–36.0)
MCV: 89.8 fL (ref 80.0–100.0)
Monocytes Absolute: 0.7 10*3/uL (ref 0.1–1.0)
Monocytes Relative: 9 %
Neutro Abs: 4.7 10*3/uL (ref 1.7–7.7)
Neutrophils Relative %: 67 %
Platelets: 191 10*3/uL (ref 150–400)
RBC: 5.2 MIL/uL — ABNORMAL HIGH (ref 3.87–5.11)
RDW: 13.4 % (ref 11.5–15.5)
WBC: 7.2 10*3/uL (ref 4.0–10.5)
nRBC: 0 % (ref 0.0–0.2)

## 2020-06-12 LAB — BASIC METABOLIC PANEL
Anion gap: 9 (ref 5–15)
BUN: 22 mg/dL (ref 8–23)
CO2: 25 mmol/L (ref 22–32)
Calcium: 9 mg/dL (ref 8.9–10.3)
Chloride: 107 mmol/L (ref 98–111)
Creatinine, Ser: 0.8 mg/dL (ref 0.44–1.00)
GFR, Estimated: >60 mL/min (ref >60)
Glucose, Bld: 105 mg/dL — ABNORMAL HIGH (ref 70–99)
Potassium: 4 mmol/L (ref 3.5–5.1)
Sodium: 141 mmol/L (ref 135–145)

## 2020-06-12 NOTE — Assessment & Plan Note (Addendum)
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 Hb-15.8; HOLD phlebotomy [given pt/pref]; Hb- today-  hematocrit 46.7 [goal < 45].  # Elevated blood pressure-110-140- STABLE.   on polypharmacy [Dr.Lateef/Dr.Spark]; phlebotomy seems to help Blood pressures.   # left knee pain s/p fall; currenly home. S/p PT. stable.  # IV access: Poor IV access.  Hold off a port at this time.  If worse port could be considered.  # DISPOSITION:  # HOLD Phlebotomy today # H&H; possible phlebotomy Q 8 weeks [pt pref] # follow up in 4 months-  MD/ labs-cbc/bmp possible phlebotomy-Dr.B  Cc; Dr.Lateef/Dr.Sparks

## 2020-06-12 NOTE — Progress Notes (Signed)
[facility] OFFICE PROGRESS NOTE  Patient Care Team: Idelle Crouch, MD as PCP - General (Internal Medicine) Cammie Sickle, MD as Medical Oncologist (Hematology and Oncology)   SUMMARY OF ONCOLOGIC HISTORY:  # April 2015- ERTHROCYTOSIS ; JAK-2/Exon- 12NEG; Epo-N; June 2017-  Phlebotomy every 6 weeks  # 1999-RIGHT BREAST- Mastec & ALND [[place]] & chemo; Tam x 5 years   INTERVAL HISTORY:  A very pleasant 80 year old female patient with above history of erythrocytosis since  April 2015 is here for follow-up/ Phlebotomy.  Unfortunately in October 2021 patient a fall contusion of the left knee.  Patient was in rehab.  Patient is currently home.  Patient states her blood pressures have been doing well.  Denies any headaches.  Patient is concerned about IV access.  Review of Systems  Constitutional: Negative for chills, diaphoresis, fever, malaise/fatigue and weight loss.  HENT: Negative for nosebleeds and sore throat.   Eyes: Negative for double vision.  Respiratory: Negative for cough, hemoptysis, sputum production, shortness of breath and wheezing.   Cardiovascular: Negative for chest pain, palpitations, orthopnea and leg swelling.  Gastrointestinal: Negative for abdominal pain, blood in stool, constipation, diarrhea, heartburn, melena, nausea and vomiting.  Genitourinary: Negative for dysuria, frequency and urgency.  Musculoskeletal: Negative for back pain and joint pain.  Skin: Negative.  Negative for itching and rash.  Neurological: Negative for dizziness, tingling, focal weakness, weakness and headaches.  Endo/Heme/Allergies: Does not bruise/bleed easily.  Psychiatric/Behavioral: Negative for depression. The patient is not nervous/anxious and does not have insomnia.      PAST MEDICAL HISTORY :  Past Medical History:  Diagnosis Date  . Breast cancer ([place]) 1999   right breast ca  . Erythrocytosis 09/30/2014  . Hypertension     PAST SURGICAL  HISTORY :   Past Surgical History:  Procedure Laterality Date  . MASTECTOMY Right 1999  . rotator cuff      FAMILY HISTORY :   Family History  Problem Relation Age of Onset  . Breast cancer Mother 17    SOCIAL HISTORY:   Social History   Tobacco Use  . Smoking status: Never Smoker  . Smokeless tobacco: Never Used  Substance Use Topics  . Alcohol use: Yes    Alcohol/week: 1.0 standard drink    Types: 1 Glasses of wine per week    Comment: BID prn    ALLERGIES:  has No Known Allergies.  MEDICATIONS:  Current Outpatient Medications  Medication Sig Dispense Refill  . amLODipine (NORVASC) 2.5 MG tablet Take 1 tablet by mouth at bedtime.     Marland Kitchen aspirin 81 MG EC tablet Take 81 mg by mouth daily.     . Calcium Carbonate-Vitamin D 600-400 MG-UNIT per tablet Take 1 tablet by mouth 2 (two) times daily.     . cloNIDine (CATAPRES) 0.2 MG tablet Take 1 tablet by mouth 3 (three) times daily.    . Coenzyme Q10 (CO Q-10) 100 MG CAPS Take 100 mg by mouth every morning.     . fluticasone (FLONASE) 50 MCG/ACT nasal spray Place 1 spray into both nostrils at bedtime.    . hydrALAZINE (APRESOLINE) 50 MG tablet Take 1 tablet by mouth 3 (three) times daily.    Marland Kitchen lisinopril (PRINIVIL,ZESTRIL) 10 MG tablet Take 10 mg by mouth 3 (three) times daily.     Marland Kitchen MAGNESIUM GLYCINATE PLUS PO Take 200 mg by mouth every evening.     . Multiple Vitamin (MULTI-VITAMINS) TABS Take 1 tablet by mouth  2 (two) times daily.     . Omega-3 Fatty Acids (FISH OIL) 1000 MG CAPS Take 1,000 mg by mouth 2 (two) times daily.     . propranolol ER (INDERAL LA) 80 MG 24 hr capsule Take 80 mg by mouth every morning.    . riboflavin (VITAMIN B-2) 100 MG TABS tablet Take 100 mg by mouth daily.     . TURMERIC PO Take 1 tablet by mouth 2 (two) times daily.     No current facility-administered medications for this visit.    PHYSICAL EXAMINATION: ECOG PERFORMANCE STATUS: 0 - Asymptomatic  BP 125/62 (BP Location: Left Arm, Patient  Position: Sitting, Cuff Size: Normal)   Pulse 66   Temp (!) 97.5 F (36.4 C) (Tympanic)   Resp 16   Ht 5\' 2"  (1.575 m)   Wt 146 lb (66.2 kg)   SpO2 98%   BMI 26.70 kg/m   Filed Weights   06/12/20 1327  Weight: 146 lb (66.2 kg)    Physical Exam HENT:     Head: Normocephalic and atraumatic.     Mouth/Throat:     Pharynx: No oropharyngeal exudate.  Eyes:     Pupils: Pupils are equal, round, and reactive to light.  Cardiovascular:     Rate and Rhythm: Normal rate and regular rhythm.  Pulmonary:     Effort: No respiratory distress.     Breath sounds: No wheezing.  Abdominal:     General: Bowel sounds are normal. There is no distension.     Palpations: Abdomen is soft. There is no mass.     Tenderness: There is no abdominal tenderness. There is no guarding or rebound.  Musculoskeletal:        General: No tenderness. Normal range of motion.     Cervical back: Normal range of motion and neck supple.  Skin:    General: Skin is warm.  Neurological:     Mental Status: She is alert and oriented to person, place, and time.  Psychiatric:        Mood and Affect: Affect normal.      LABORATORY DATA:  I have reviewed the data as listed    Component Value Date/Time   NA 141 06/12/2020 1257   NA 142 06/04/2014 1443   K 4.0 06/12/2020 1257   K 4.1 06/04/2014 1443   CL 107 06/12/2020 1257   CL 108 (H) 06/04/2014 1443   CO2 25 06/12/2020 1257   CO2 26 06/04/2014 1443   GLUCOSE 105 (H) 06/12/2020 1257   GLUCOSE 99 06/04/2014 1443   BUN 22 06/12/2020 1257   BUN 18 06/04/2014 1443   CREATININE 0.80 06/12/2020 1257   CREATININE 0.92 06/04/2014 1443   CALCIUM 9.0 06/12/2020 1257   CALCIUM 9.0 06/04/2014 1443   PROT 6.8 12/20/2015 1757   PROT 6.8 06/04/2014 1443   ALBUMIN 4.1 12/20/2015 1757   ALBUMIN 3.4 06/04/2014 1443   AST 28 12/20/2015 1757   AST 25 06/04/2014 1443   ALT 19 12/20/2015 1757   ALT 23 06/04/2014 1443   ALKPHOS 51 12/20/2015 1757   ALKPHOS 52 06/04/2014  1443   BILITOT 0.8 12/20/2015 1757   BILITOT 0.3 06/04/2014 1443   GFRNONAA >60 06/12/2020 1257   GFRNONAA >60 06/04/2014 1443   GFRNONAA 51 (L) 12/11/2013 1412   GFRAA >60 01/06/2020 1253   GFRAA >60 06/04/2014 1443   GFRAA 59 (L) 12/11/2013 1412    No results found for: SPEP, UPEP  Lab Results  Component Value Date   WBC 7.2 06/12/2020   NEUTROABS 4.7 06/12/2020   HGB 15.8 (H) 06/12/2020   HCT 46.7 (H) 06/12/2020   MCV 89.8 06/12/2020   PLT 191 06/12/2020      Chemistry      Component Value Date/Time   NA 141 06/12/2020 1257   NA 142 06/04/2014 1443   K 4.0 06/12/2020 1257   K 4.1 06/04/2014 1443   CL 107 06/12/2020 1257   CL 108 (H) 06/04/2014 1443   CO2 25 06/12/2020 1257   CO2 26 06/04/2014 1443   BUN 22 06/12/2020 1257   BUN 18 06/04/2014 1443   CREATININE 0.80 06/12/2020 1257   CREATININE 0.92 06/04/2014 1443      Component Value Date/Time   CALCIUM 9.0 06/12/2020 1257   CALCIUM 9.0 06/04/2014 1443   ALKPHOS 51 12/20/2015 1757   ALKPHOS 52 06/04/2014 1443   AST 28 12/20/2015 1757   AST 25 06/04/2014 1443   ALT 19 12/20/2015 1757   ALT 23 06/04/2014 1443   BILITOT 0.8 12/20/2015 1757   BILITOT 0.3 06/04/2014 1443          ASSESSMENT & PLAN:   Erythrocytosis # ERYTHROCYTOSIS-  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 Hb-15.8; HOLD phlebotomy [given pt/pref]; Hb- today-  hematocrit 46.7 [goal < 45].  # Elevated blood pressure-110-140- STABLE.   on polypharmacy [Dr.Lateef/Dr.Spark]; phlebotomy seems to help Blood pressures.   # left knee pain s/p fall; currenly home. S/p PT. stable.  # IV access: Poor IV access.  Hold off a port at this time.  If worse port could be considered.  # DISPOSITION:  # HOLD Phlebotomy today # H&H; possible phlebotomy Q 8 weeks [pt pref] # follow up in 4 months-  MD/ labs-cbc/bmp possible phlebotomy-Dr.B  Cc;  Dr.Lateef/Dr.Sparks    ;Cammie Sickle, MD 06/12/2020 2:01 PM

## 2020-07-21 ENCOUNTER — Telehealth: Payer: Self-pay | Admitting: *Deleted

## 2020-07-21 NOTE — Telephone Encounter (Signed)
Patient does not have an appointment for 2 more weeks and she did not get phlebotomy at her last appointment, She had labs drawn at PCP today and her HCT is 51.1. She is asking if her appointment needs to be moved up or keep as scheduled. Please advise  Related to CBC w/auto Differential (5 Part)  Component 07/21/2020 04/06/2020 12/04/2019 09/03/2019 05/31/2019 11/26/2018 08/20/2018 05/29/2018 01/24/2018 07/11/2017 01/02/2017 06/09/2016 06/19/2015 12/12/2014                  WBC (White Blood Cell Count) 5.3 6.0 5.6 7.3 6.5 6.0 6.6 6.4 5.6 6.6 6.4 6.4 4.7 7.7  RBC (Red Blood Cell Count) 5.54High   5.17 4.95 5.32 4.81 4.85 4.79 5.33 5.15 5.32 4.76 4.74 4.95 5.01  Hemoglobin 16.6High   15.7   15.0 15.6High   14.0 14.3 14.9 16.4High   15.7High   16.4High   14.9 14.5 14.8 14.3  Hematocrit 51.1High   48.5   45.9 48.5High   44.3 43.7 45.4 50.2High   49.1High   49.9High   43.8 43.1 46.4 45.0  MCV (Mean Corpuscular Volume) 92.2 93.8 92.7 91.2 92.1 90.1 94.8 94.2 95.3 93.8 92.0 90.9 93.7 89.8  MCH (Mean Corpuscular Hemoglobin) 30.0 30.4 30.3 29.3 29.1 29.5 31.1 30.8 30.5 30.8 31.3High   30.6 29.9 28.5  MCHC (Mean Corpuscular Hemoglobin Concentration) 32.5 32.4 32.7 32.2 31.6Low   32.7 32.8 32.7 32.0 32.9 34.0 33.6 31.9Low   31.8Low    Platelet Count 170 196 171 205 204 209 196 226 218 177  208 211 196 223  RDW-CV (Red Cell Distribution Width) 14.0 13.0 14.3 13.9 13.5 13.2 12.5 12.8 12.3 12.7 13.2 13.0 13.6 14.4  MPV (Mean Platelet Volume) 11.1 10.7 11.0 11.5 11.1 11.2 10.9 10.9 11.0 10.9 10.4 11.0 11.2High   11.3High    Neutrophils 3.05 3.73 3.77 4.77 4.20 3.88 4.42 4.08 3.48 4.59 3.85 3.95 2.43 5.69  Lymphocytes 1.52 1.35 1.13 1.55 1.53 1.38 1.35 1.56 1.36 1.06 1.74 1.69 1.54 1.30  Monocytes 0.63 0.75 0.59 0.83 0.67 0.67 0.69 0.61 0.61 0.87 0.67 0.62 0.53 0.57  Eosinophils 0.07 0.07 0.09 0.06 0.08 0.05 0.06 0.07 0.07 0.06 0.05 0.08 0.11 0.10  Basophils  0.04 0.04 0.02 0.04 0.02 0.03 0.02 0.05 0.03 0.03 0.04 0.04 0.04 0.03  Neutrophil % 57.3 62.6 67.2 65.8 64.5 64.5 67.5 63.8 62.5 69.3 60.5 61.8 52.1 73.9High    Lymphocyte % 28.6 22.7 20.1 21.3 23.5 22.9 20.6 24.5 24.5 16.0 27.4 26.4 33.0 16.9  Monocyte % 11.8 12.6 10.5 11.4 10.3 11.1 10.5 9.6 11.0 13.1High   10.5 9.7 11.4 7.4  Eosinophil % 1.3 1.2 1.6 0.8Low   1.2 0.8Low   0.9Low   1.1 1.3 0.9Low   0.8Low   1.3 2.4 1.3  Basophil% 0.8 0.7 0.4 0.6 0.3 0.5 0.3 0.8 0.5 0.5 0.6 0.6 0.9 0.4  Immature Granulocyte % 0.2 0.2 0.2 0.1 0.2 0.2 0.2 0.2 0.2 0.2 0.2 0.2 0.2 0.1  Immature Granulocyte Count 0.01 0.01 0.01 0.01 0.01 0.01

## 2020-07-21 NOTE — Telephone Encounter (Signed)
Phlebotomy scheduled.

## 2020-07-21 NOTE — Telephone Encounter (Signed)
Ok to schedule patient for phlebotomy- Colette, please call patient to arrange

## 2020-07-22 ENCOUNTER — Telehealth: Payer: Self-pay | Admitting: *Deleted

## 2020-07-22 NOTE — Telephone Encounter (Signed)
Patient called reporting that she has been exposed to [place] and she has appointment tomorrow for phlebotomy. She states she did a test at home and it is negative, but was advised to retest in 1 - 2 days. Please advise what to do about her phlebotomy appointment tomorrow

## 2020-07-22 NOTE — Telephone Encounter (Signed)
Per Dr. Jacinto Reap -Patient needs Repeat covid testing-   Michele Andrade, please r/s one day next week

## 2020-07-22 NOTE — Telephone Encounter (Signed)
Patient elected to cnl the apts. She will retest for covid on Friday and will the office back to reschedule her apts.

## 2020-07-23 ENCOUNTER — Inpatient Hospital Stay: Payer: Medicare Other

## 2020-07-27 ENCOUNTER — Encounter: Payer: Self-pay | Admitting: *Deleted

## 2020-07-27 ENCOUNTER — Telehealth: Payer: Self-pay | Admitting: *Deleted

## 2020-07-27 NOTE — Telephone Encounter (Signed)
I will cancel for now

## 2020-07-27 NOTE — Telephone Encounter (Signed)
Patient called reporting that she is now having symptoms of COVID and that she has tested positive for it as well. She said she will call back after she tests negative for her phlebotomy appointment and wants to keep her appointment for 4/1 for now unless she can come sooner.

## 2020-07-31 ENCOUNTER — Telehealth: Payer: Self-pay | Admitting: Internal Medicine

## 2020-07-31 NOTE — Telephone Encounter (Signed)
Heather, I'm really sorry to do this, but she left a 5 minute VM about having Covid, receiving the Covid treatment. Dr. Doy Hutching instructing her on waiting 10 days until her next Phlebotomy. Requesting a new appt for labs and to see Dr. B. Just lots of information overall. She is requesting a call back.

## 2020-07-31 NOTE — Telephone Encounter (Signed)
Michele Andrade - please schedule patient for phlebotomy week of 4/4

## 2020-08-03 NOTE — Telephone Encounter (Signed)
She is down for a phlebotomy on Friday I am assuming im cancelling this one and doing the one for week of 4/4 and do I need to do iron.

## 2020-08-03 NOTE — Telephone Encounter (Signed)
Her phlebotomy needs to be scheduled at least 2 weeks from her covid + results.

## 2020-08-07 ENCOUNTER — Inpatient Hospital Stay: Payer: Medicare Other

## 2020-08-10 ENCOUNTER — Inpatient Hospital Stay: Payer: Medicare Other

## 2020-08-10 ENCOUNTER — Inpatient Hospital Stay: Payer: Medicare Other | Attending: Internal Medicine

## 2020-08-10 VITALS — BP 125/74 | HR 64 | Temp 98.6°F | Resp 18

## 2020-08-10 DIAGNOSIS — Z79899 Other long term (current) drug therapy: Secondary | ICD-10-CM | POA: Insufficient documentation

## 2020-08-10 DIAGNOSIS — D751 Secondary polycythemia: Secondary | ICD-10-CM | POA: Insufficient documentation

## 2020-08-10 LAB — HEMOGLOBIN: Hemoglobin: 16 g/dL — ABNORMAL HIGH (ref 12.0–15.0)

## 2020-08-10 LAB — HEMATOCRIT: HCT: 48.1 % — ABNORMAL HIGH (ref 36.0–46.0)

## 2020-08-10 NOTE — Progress Notes (Signed)
HCT 48.1 300 ml phlebotomy performed per MD parameters. Tolerated very well. Drank water and observed. Stable at time of discharge.

## 2020-09-14 ENCOUNTER — Telehealth: Payer: Self-pay | Admitting: *Deleted

## 2020-09-14 ENCOUNTER — Telehealth: Payer: Self-pay | Admitting: Internal Medicine

## 2020-09-14 DIAGNOSIS — D751 Secondary polycythemia: Secondary | ICD-10-CM

## 2020-09-14 NOTE — Telephone Encounter (Signed)
On 5/09-spoke to patient regarding her concerns for phlebotomy s/p monoclonal antibody infusion.  Patient agreeable to reschedule a follow-up to 5/31 or June 1st; MD; labs- cbc/bmp; possible phlebotomy.  C-please reschedule.  GB

## 2020-09-14 NOTE — Telephone Encounter (Signed)
Dr. B - please advise. 

## 2020-09-14 NOTE — Telephone Encounter (Signed)
Michele Andrade - ok to add patient to Dr. Aletha Halim schedule. However, she is scheduled on a Friday and we will need to move the apts to a different day given Dr. Sharmaine [place] schedule.  Dr. B said he would call the patient to let her know this, but he still wanted to see the pt face to face.

## 2020-09-14 NOTE — Telephone Encounter (Signed)
Patient called wanting a return call as well as a face to face appointment with Dr B at her next phlebotomy appointment on 5/27 stating that she has questions about what hhe phlebotomizes is doing to her body. The reason for the call is to discuss that she had a monoclonal infusion for COVID in March and she is wondering how the phlebotomies affect her immunity since the MAB's attach to [facility]

## 2020-09-15 NOTE — Telephone Encounter (Signed)
Colette- Please arrange for new apts. Thanks.

## 2020-09-15 NOTE — Addendum Note (Signed)
Addended by: Gloris Ham on: 09/15/2020 10:45 AM   Modules accepted: Orders

## 2020-10-02 ENCOUNTER — Inpatient Hospital Stay: Payer: Medicare Other

## 2020-10-06 ENCOUNTER — Other Ambulatory Visit: Payer: Self-pay

## 2020-10-06 ENCOUNTER — Inpatient Hospital Stay: Payer: Medicare Other | Attending: Internal Medicine

## 2020-10-06 ENCOUNTER — Inpatient Hospital Stay (HOSPITAL_BASED_OUTPATIENT_CLINIC_OR_DEPARTMENT_OTHER): Payer: Medicare Other | Admitting: Internal Medicine

## 2020-10-06 ENCOUNTER — Encounter: Payer: Self-pay | Admitting: Internal Medicine

## 2020-10-06 VITALS — BP 150/75 | HR 59 | Resp 20

## 2020-10-06 DIAGNOSIS — Z79899 Other long term (current) drug therapy: Secondary | ICD-10-CM | POA: Insufficient documentation

## 2020-10-06 DIAGNOSIS — D751 Secondary polycythemia: Secondary | ICD-10-CM | POA: Insufficient documentation

## 2020-10-06 LAB — CBC WITH DIFFERENTIAL/PLATELET
Abs Immature Granulocytes: 0.01 10*3/uL (ref 0.00–0.07)
Basophils Absolute: 0 10*3/uL (ref 0.0–0.1)
Basophils Relative: 0 %
Eosinophils Absolute: 0.1 10*3/uL (ref 0.0–0.5)
Eosinophils Relative: 1 %
HCT: 48.9 % — ABNORMAL HIGH (ref 36.0–46.0)
Hemoglobin: 16.3 g/dL — ABNORMAL HIGH (ref 12.0–15.0)
Immature Granulocytes: 0 %
Lymphocytes Relative: 22 %
Lymphs Abs: 1.7 10*3/uL (ref 0.7–4.0)
MCH: 30.9 pg (ref 26.0–34.0)
MCHC: 33.3 g/dL (ref 30.0–36.0)
MCV: 92.8 fL (ref 80.0–100.0)
Monocytes Absolute: 0.7 10*3/uL (ref 0.1–1.0)
Monocytes Relative: 8 %
Neutro Abs: 5.4 10*3/uL (ref 1.7–7.7)
Neutrophils Relative %: 69 %
Platelets: 188 10*3/uL (ref 150–400)
RBC: 5.27 MIL/uL — ABNORMAL HIGH (ref 3.87–5.11)
RDW: 13 % (ref 11.5–15.5)
WBC: 7.9 10*3/uL (ref 4.0–10.5)
nRBC: 0 % (ref 0.0–0.2)

## 2020-10-06 LAB — BASIC METABOLIC PANEL
Anion gap: 10 (ref 5–15)
BUN: 21 mg/dL (ref 8–23)
CO2: 26 mmol/L (ref 22–32)
Calcium: 9.1 mg/dL (ref 8.9–10.3)
Chloride: 102 mmol/L (ref 98–111)
Creatinine, Ser: 0.8 mg/dL (ref 0.44–1.00)
GFR, Estimated: >60 mL/min (ref >60)
Glucose, Bld: 105 mg/dL — ABNORMAL HIGH (ref 70–99)
Potassium: 4 mmol/L (ref 3.5–5.1)
Sodium: 138 mmol/L (ref 135–145)

## 2020-10-06 NOTE — Progress Notes (Signed)
[place] OFFICE PROGRESS NOTE  Patient Care Team: Idelle Crouch, MD as PCP - General (Internal Medicine) Cammie Sickle, MD as Medical Oncologist (Hematology and Oncology)   SUMMARY OF ONCOLOGIC HISTORY:  # April 2015- ERTHROCYTOSIS ; JAK-2/Exon- 12NEG; Epo-N; June 2017-  Phlebotomy every 6 weeks  # 1999-RIGHT BREAST- Mastec & ALND [[place]] & chemo; Tam x 5 years   INTERVAL HISTORY:  A very pleasant 80 year old female patient with above history of erythrocytosis since  April 2015 is here for follow-up/ Phlebotomy.  Patient denies any new onset of worsening headaches.  Denies any blood clots.  Exposure to a friend who had upper respiratory symptoms.  Patient concerned.  Review of Systems  Constitutional: Negative for chills, diaphoresis, fever, malaise/fatigue and weight loss.  HENT: Negative for nosebleeds and sore throat.   Eyes: Negative for double vision.  Respiratory: Negative for cough, hemoptysis, sputum production, shortness of breath and wheezing.   Cardiovascular: Negative for chest pain, palpitations, orthopnea and leg swelling.  Gastrointestinal: Negative for abdominal pain, blood in stool, constipation, diarrhea, heartburn, melena, nausea and vomiting.  Genitourinary: Negative for dysuria, frequency and urgency.  Musculoskeletal: Negative for back pain and joint pain.  Skin: Negative.  Negative for itching and rash.  Neurological: Negative for dizziness, tingling, focal weakness, weakness and headaches.  Endo/Heme/Allergies: Does not bruise/bleed easily.  Psychiatric/Behavioral: Negative for depression. The patient is not nervous/anxious and does not have insomnia.      PAST MEDICAL HISTORY :  Past Medical History:  Diagnosis Date  . Breast cancer ([place]) 1999   right breast ca  . Erythrocytosis 09/30/2014  . Hypertension     PAST SURGICAL HISTORY :   Past Surgical History:  Procedure Laterality Date  . MASTECTOMY Right 1999   . rotator cuff      FAMILY HISTORY :   Family History  Problem Relation Age of Onset  . Breast cancer Mother 50    SOCIAL HISTORY:   Social History   Tobacco Use  . Smoking status: Never Smoker  . Smokeless tobacco: Never Used  Substance Use Topics  . Alcohol use: Yes    Alcohol/week: 1.0 standard drink    Types: 1 Glasses of wine per week    Comment: BID prn    ALLERGIES:  has No Known Allergies.  MEDICATIONS:  Current Outpatient Medications  Medication Sig Dispense Refill  . amLODipine (NORVASC) 2.5 MG tablet Take 1 tablet by mouth at bedtime.     Marland Kitchen aspirin 81 MG EC tablet Take 81 mg by mouth daily.     . Calcium Carbonate-Vitamin D 600-400 MG-UNIT per tablet Take 1 tablet by mouth 2 (two) times daily.     . cloNIDine (CATAPRES) 0.2 MG tablet Take 1 tablet by mouth 3 (three) times daily.    . Coenzyme Q10 (CO Q-10) 100 MG CAPS Take 100 mg by mouth every morning.     . fluticasone (FLONASE) 50 MCG/ACT nasal spray Place 1 spray into both nostrils at bedtime.    . hydrALAZINE (APRESOLINE) 50 MG tablet Take 1 tablet by mouth 3 (three) times daily.    Marland Kitchen lisinopril (PRINIVIL,ZESTRIL) 10 MG tablet Take 10 mg by mouth 3 (three) times daily.     Marland Kitchen MAGNESIUM GLYCINATE PLUS PO Take 200 mg by mouth every evening.     . Multiple Vitamin (MULTI-VITAMINS) TABS Take 1 tablet by mouth 2 (two) times daily.     . Omega-3 Fatty Acids (FISH OIL) 1000 MG CAPS  Take 1,000 mg by mouth 2 (two) times daily.     . propranolol ER (INDERAL LA) 80 MG 24 hr capsule Take 80 mg by mouth every morning.    . riboflavin (VITAMIN B-2) 100 MG TABS tablet Take 100 mg by mouth daily.     . TURMERIC PO Take 1 tablet by mouth 2 (two) times daily.     No current facility-administered medications for this visit.    PHYSICAL EXAMINATION: ECOG PERFORMANCE STATUS: 0 - Asymptomatic  BP (!) 166/82   Pulse 67   Temp 98.3 F (36.8 C) (Tympanic)   Resp 16   Wt 65.1 kg   BMI 26.26 kg/m   Filed Weights    10/06/20 1320  Weight: 65.1 kg    Physical Exam HENT:     Head: Normocephalic and atraumatic.     Mouth/Throat:     Pharynx: No oropharyngeal exudate.  Eyes:     Pupils: Pupils are equal, round, and reactive to light.  Cardiovascular:     Rate and Rhythm: Normal rate and regular rhythm.  Pulmonary:     Effort: No respiratory distress.     Breath sounds: No wheezing.  Abdominal:     General: Bowel sounds are normal. There is no distension.     Palpations: Abdomen is soft. There is no mass.     Tenderness: There is no abdominal tenderness. There is no guarding or rebound.  Musculoskeletal:        General: No tenderness. Normal range of motion.     Cervical back: Normal range of motion and neck supple.  Skin:    General: Skin is warm.  Neurological:     Mental Status: She is alert and oriented to person, place, and time.  Psychiatric:        Mood and Affect: Affect normal.      LABORATORY DATA:  I have reviewed the data as listed    Component Value Date/Time   NA 138 10/06/2020 1255   NA 142 06/04/2014 1443   K 4.0 10/06/2020 1255   K 4.1 06/04/2014 1443   CL 102 10/06/2020 1255   CL 108 (H) 06/04/2014 1443   CO2 26 10/06/2020 1255   CO2 26 06/04/2014 1443   GLUCOSE 105 (H) 10/06/2020 1255   GLUCOSE 99 06/04/2014 1443   BUN 21 10/06/2020 1255   BUN 18 06/04/2014 1443   CREATININE 0.80 10/06/2020 1255   CREATININE 0.92 06/04/2014 1443   CALCIUM 9.1 10/06/2020 1255   CALCIUM 9.0 06/04/2014 1443   PROT 6.8 12/20/2015 1757   PROT 6.8 06/04/2014 1443   ALBUMIN 4.1 12/20/2015 1757   ALBUMIN 3.4 06/04/2014 1443   AST 28 12/20/2015 1757   AST 25 06/04/2014 1443   ALT 19 12/20/2015 1757   ALT 23 06/04/2014 1443   ALKPHOS 51 12/20/2015 1757   ALKPHOS 52 06/04/2014 1443   BILITOT 0.8 12/20/2015 1757   BILITOT 0.3 06/04/2014 1443   GFRNONAA >60 10/06/2020 1255   GFRNONAA >60 06/04/2014 1443   GFRNONAA 51 (L) 12/11/2013 1412   GFRAA >60 01/06/2020 1253   GFRAA >60  06/04/2014 1443   GFRAA 59 (L) 12/11/2013 1412    No results found for: SPEP, UPEP  Lab Results  Component Value Date   WBC 7.9 10/06/2020   NEUTROABS 5.4 10/06/2020   HGB 16.3 (H) 10/06/2020   HCT 48.9 (H) 10/06/2020   MCV 92.8 10/06/2020   PLT 188 10/06/2020      Chemistry  Component Value Date/Time   NA 138 10/06/2020 1255   NA 142 06/04/2014 1443   K 4.0 10/06/2020 1255   K 4.1 06/04/2014 1443   CL 102 10/06/2020 1255   CL 108 (H) 06/04/2014 1443   CO2 26 10/06/2020 1255   CO2 26 06/04/2014 1443   BUN 21 10/06/2020 1255   BUN 18 06/04/2014 1443   CREATININE 0.80 10/06/2020 1255   CREATININE 0.92 06/04/2014 1443      Component Value Date/Time   CALCIUM 9.1 10/06/2020 1255   CALCIUM 9.0 06/04/2014 1443   ALKPHOS 51 12/20/2015 1757   ALKPHOS 52 06/04/2014 1443   AST 28 12/20/2015 1757   AST 25 06/04/2014 1443   ALT 19 12/20/2015 1757   ALT 23 06/04/2014 1443   BILITOT 0.8 12/20/2015 1757   BILITOT 0.3 06/04/2014 1443          ASSESSMENT & PLAN:   Erythrocytosis # ERYTHROCYTOSIS-  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 Hb-48.9; proceed with phlebotomy [given pt/pref];  [goal < 45].  # Elevated blood pressure-110-140 [at clinic 166]- STABLE; on polypharmacy [Dr.Lateef/Dr.Spark]; phlebotomy seems to help Blood pressures.   # IV access: Poor IV access.  Hold off a port at this time.  If worse port could be considered.  # DISPOSITION:  # Phlebotomy today # H&H; possible phlebotomy Q 8 weeks [pt pref] # follow up in 4 months-  MD/ labs-cbc/bmp possible phlebotomy-Dr.B  Cc; Dr.Lateef/Dr.Sparks    ;Cammie Sickle, MD 10/06/2020 2:54 PM

## 2020-10-06 NOTE — Assessment & Plan Note (Signed)
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 Hb-48.9; proceed with phlebotomy [given pt/pref];  [goal < 45].  # Elevated blood pressure-110-140 [at clinic 166]- STABLE; on polypharmacy [Dr.Lateef/Dr.Spark]; phlebotomy seems to help Blood pressures.   # IV access: Poor IV access.  Hold off a port at this time.  If worse port could be considered.  # DISPOSITION:  # Phlebotomy today # H&H; possible phlebotomy Q 8 weeks [pt pref] # follow up in 4 months-  MD/ labs-cbc/bmp possible phlebotomy-Dr.B  Cc; Dr.Lateef/Dr.Sparks

## 2020-10-06 NOTE — Progress Notes (Signed)
Patient here for follow up she has questions about her next Covid.

## 2020-10-06 NOTE — Patient Instructions (Signed)

## 2020-10-19 ENCOUNTER — Other Ambulatory Visit: Payer: Self-pay | Admitting: Internal Medicine

## 2020-10-19 DIAGNOSIS — Z1231 Encounter for screening mammogram for malignant neoplasm of breast: Secondary | ICD-10-CM

## 2020-11-19 ENCOUNTER — Telehealth: Payer: Self-pay | Admitting: *Deleted

## 2020-11-19 ENCOUNTER — Other Ambulatory Visit: Payer: Self-pay

## 2020-11-19 ENCOUNTER — Emergency Department
Admission: EM | Admit: 2020-11-19 | Discharge: 2020-11-19 | Disposition: A | Discharge status: Home / Self Care | Payer: Medicare Other | Attending: Emergency Medicine | Admitting: Emergency Medicine

## 2020-11-19 ENCOUNTER — Inpatient Hospital Stay: Payer: Medicare Other | Attending: Internal Medicine

## 2020-11-19 ENCOUNTER — Inpatient Hospital Stay (HOSPITAL_BASED_OUTPATIENT_CLINIC_OR_DEPARTMENT_OTHER): Payer: Medicare Other | Admitting: Internal Medicine

## 2020-11-19 ENCOUNTER — Emergency Department: Payer: Medicare Other

## 2020-11-19 DIAGNOSIS — R42 Dizziness and giddiness: Secondary | ICD-10-CM

## 2020-11-19 DIAGNOSIS — Z7982 Long term (current) use of aspirin: Secondary | ICD-10-CM | POA: Insufficient documentation

## 2020-11-19 DIAGNOSIS — Z79899 Other long term (current) drug therapy: Secondary | ICD-10-CM | POA: Insufficient documentation

## 2020-11-19 DIAGNOSIS — D751 Secondary polycythemia: Secondary | ICD-10-CM

## 2020-11-19 DIAGNOSIS — Z853 Personal history of malignant neoplasm of breast: Secondary | ICD-10-CM | POA: Diagnosis not present

## 2020-11-19 DIAGNOSIS — R1110 Vomiting, unspecified: Secondary | ICD-10-CM

## 2020-11-19 DIAGNOSIS — F17210 Nicotine dependence, cigarettes, uncomplicated: Secondary | ICD-10-CM | POA: Insufficient documentation

## 2020-11-19 DIAGNOSIS — I10 Essential (primary) hypertension: Secondary | ICD-10-CM | POA: Diagnosis not present

## 2020-11-19 LAB — CBC WITH DIFFERENTIAL/PLATELET
Abs Immature Granulocytes: 0.04 10*3/uL (ref 0.00–0.07)
Basophils Absolute: 0.1 10*3/uL (ref 0.0–0.1)
Basophils Relative: 1 %
Eosinophils Absolute: 0.1 10*3/uL (ref 0.0–0.5)
Eosinophils Relative: 1 %
HCT: 50.2 % — ABNORMAL HIGH (ref 36.0–46.0)
Hemoglobin: 16.7 g/dL — ABNORMAL HIGH (ref 12.0–15.0)
Immature Granulocytes: 1 %
Lymphocytes Relative: 15 %
Lymphs Abs: 1.3 10*3/uL (ref 0.7–4.0)
MCH: 30.4 pg (ref 26.0–34.0)
MCHC: 33.3 g/dL (ref 30.0–36.0)
MCV: 91.4 fL (ref 80.0–100.0)
Monocytes Absolute: 0.7 10*3/uL (ref 0.1–1.0)
Monocytes Relative: 9 %
Neutro Abs: 6.1 10*3/uL (ref 1.7–7.7)
Neutrophils Relative %: 73 %
Platelets: 216 10*3/uL (ref 150–400)
RBC: 5.49 MIL/uL — ABNORMAL HIGH (ref 3.87–5.11)
RDW: 12.4 % (ref 11.5–15.5)
WBC: 8.3 10*3/uL (ref 4.0–10.5)
nRBC: 0 % (ref 0.0–0.2)

## 2020-11-19 LAB — BASIC METABOLIC PANEL
Anion gap: 6 (ref 5–15)
BUN: 22 mg/dL (ref 8–23)
CO2: 26 mmol/L (ref 22–32)
Calcium: 9.7 mg/dL (ref 8.9–10.3)
Chloride: 106 mmol/L (ref 98–111)
Creatinine, Ser: 0.82 mg/dL (ref 0.44–1.00)
GFR, Estimated: >60 mL/min (ref >60)
Glucose, Bld: 145 mg/dL — ABNORMAL HIGH (ref 70–99)
Potassium: 3.7 mmol/L (ref 3.5–5.1)
Sodium: 138 mmol/L (ref 135–145)

## 2020-11-19 LAB — TROPONIN I (HIGH SENSITIVITY)
Troponin I (High Sensitivity): 6 ng/L (ref <18)
Troponin I (High Sensitivity): 7 ng/L (ref <18)

## 2020-11-19 MED ORDER — MECLIZINE HCL 25 MG PO TABS
50.0000 mg | ORAL_TABLET | Freq: Once | ORAL | Status: AC
Start: 2020-11-19 — End: 2020-11-19
  Administered 2020-11-19: 50 mg via ORAL
  Filled 2020-11-19: qty 2

## 2020-11-19 MED ORDER — MECLIZINE HCL 25 MG PO TABS: ORAL_TABLET | ORAL | 0 refills | Status: DC

## 2020-11-19 MED ORDER — LORAZEPAM 2 MG/ML IJ SOLN: 0.5000 mg | Freq: Once | INTRAMUSCULAR | Status: DC

## 2020-11-19 NOTE — Telephone Encounter (Signed)
Called bed placement. There are no beds available in the ER. Dr. Rogue Bussing made aware.

## 2020-11-19 NOTE — Assessment & Plan Note (Addendum)
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  Today hematocrit is 50/[goal < 45]; however phlebotomy again attempted in the clinic unable to draw more than 100 cc because IV access.  #With regards to dizzy spells-I suspect this is secondary to vertigo; and not related to her elevated hematocrit/erythrocytosis.  Recommend meclizine 50 mg x 1 p.o.   # Elevated blood pressure-initially elevated in the emergency room ~200s; however currently improved and 170s s/p antihypertensives in the emergency room.  Continue monitoring at home.  # IV access: Poor IV access.  Hold off a port at this time.  If worse port could be considered.  # DISPOSITION: #Spoke to patient's facility [facility]-patient will need to be admitted to assisted living space given the need for supervision for the next few days given the ongoing vertigo.  Addendum: About an hour post meclizine-patient's symptoms improved; patient was given a prescription for meclizine/sent over to [facility].  We will follow-up with me as planned  # 25 minutes face-to-face with the patient discussing the above plan of care; more than 50% of time spent on prognosis/ natural history; counseling and coordination.    Cc; Dr.Lateef/Dr.Sparks

## 2020-11-19 NOTE — Discharge Instructions (Addendum)
Please follow-up immediately in the hematology/oncology clinic for a phlebotomy session

## 2020-11-19 NOTE — Progress Notes (Signed)
[facility] OFFICE PROGRESS NOTE  Patient Care Team: Idelle Crouch, MD as PCP - General (Internal Medicine) Cammie Sickle, MD as Medical Oncologist (Hematology and Oncology)   SUMMARY OF ONCOLOGIC HISTORY:  # April 2015- ERTHROCYTOSIS ; JAK-2/Exon- 12NEG; Epo-N; June 2017-  Phlebotomy every 6 weeks  # 1999-RIGHT BREAST- Mastec & ALND [[place]] & chemo; Tam x 5 years   INTERVAL HISTORY:  A very pleasant 79 year old female patient with above history of symptomatic erythrocytosis [headaches elevated blood pressure] since  April 2015 is sent over from the emergency room for consideration of phlebotomy.   Patient stated she was doing very well until last admission noted to have mild headache.  She woke up to dizziness which over time progressed.  At this point of time patient was evaluated in the emergency room-noted to have a hematocrit of 50; and blood pressure of systolic over 053Z.  Patient received antihypertensives in the emergency room.  Of note patient's blood pressures have been doing very well/under control even last night.  Multiple attempts for phlebotomy done in the emergency room unsuccessful.  Patient was then sent over to the clinic for continued phlebotomy.  In the clinic patient complains of dizzy spells/vertigo-like symptoms.  Also the anxiety making her blood pressure go up.  Positive for nausea no vomiting.  Review of Systems  Constitutional:  Positive for malaise/fatigue. Negative for chills, diaphoresis, fever and weight loss.  HENT:  Negative for nosebleeds and sore throat.   Eyes:  Negative for double vision.  Respiratory:  Negative for cough, hemoptysis, sputum production, shortness of breath and wheezing.   Cardiovascular:  Negative for chest pain, palpitations, orthopnea and leg swelling.  Gastrointestinal:  Positive for nausea. Negative for abdominal pain, blood in stool, constipation, diarrhea, heartburn, melena and vomiting.   Genitourinary:  Negative for dysuria, frequency and urgency.  Musculoskeletal:  Negative for back pain and joint pain.  Skin: Negative.  Negative for itching and rash.  Neurological:  Positive for dizziness and headaches. Negative for tingling, focal weakness and weakness.  Endo/Heme/Allergies:  Does not bruise/bleed easily.  Psychiatric/Behavioral:  Negative for depression. The patient is not nervous/anxious and does not have insomnia.     PAST MEDICAL HISTORY :  Past Medical History:  Diagnosis Date  . Breast cancer ([place]) 1999   right breast ca  . Erythrocytosis 09/30/2014  . Hypertension     PAST SURGICAL HISTORY :   Past Surgical History:  Procedure Laterality Date  . MASTECTOMY Right 1999  . rotator cuff      FAMILY HISTORY :   Family History  Problem Relation Age of Onset  . Breast cancer Mother 57    SOCIAL HISTORY:   Social History   Tobacco Use  . Smoking status: Never  . Smokeless tobacco: Never  Substance Use Topics  . Alcohol use: Yes    Alcohol/week: 1.0 standard drink    Types: 1 Glasses of wine per week    Comment: BID prn    ALLERGIES:  has no allergies on file.  MEDICATIONS:  Current Outpatient Medications  Medication Sig Dispense Refill  . amLODipine (NORVASC) 2.5 MG tablet Take 1 tablet by mouth at bedtime.     Marland Kitchen aspirin 81 MG EC tablet Take 81 mg by mouth daily.     . Calcium Carbonate-Vitamin D 600-400 MG-UNIT per tablet Take 1 tablet by mouth 2 (two) times daily.     . cloNIDine (CATAPRES) 0.2 MG tablet Take 1 tablet by  mouth 3 (three) times daily.    . Coenzyme Q10 (CO Q-10) 100 MG CAPS Take 100 mg by mouth every morning.     . fluticasone (FLONASE) 50 MCG/ACT nasal spray Place 1 spray into both nostrils at bedtime.    . hydrALAZINE (APRESOLINE) 50 MG tablet Take 1 tablet by mouth 3 (three) times daily.    Marland Kitchen lisinopril (PRINIVIL,ZESTRIL) 10 MG tablet Take 10 mg by mouth 3 (three) times daily.     Marland Kitchen MAGNESIUM GLYCINATE PLUS PO Take 200 mg  by mouth every evening.     . meclizine (ANTIVERT) 25 MG tablet meclizine 25 mg 2 pills every 8 hours x1 day; and then 1 pill every 8 hx 1 day;if improved then 1 pill every 6-8 hours as needed for dizziness 60 tablet 0  . Multiple Vitamin (MULTI-VITAMINS) TABS Take 1 tablet by mouth 2 (two) times daily.     . Omega-3 Fatty Acids (FISH OIL) 1000 MG CAPS Take 1,000 mg by mouth 2 (two) times daily.     . propranolol ER (INDERAL LA) 80 MG 24 hr capsule Take 80 mg by mouth every morning.    . riboflavin (VITAMIN B-2) 100 MG TABS tablet Take 100 mg by mouth daily.     . TURMERIC PO Take 1 tablet by mouth 2 (two) times daily.     No current facility-administered medications for this visit.    PHYSICAL EXAMINATION: ECOG PERFORMANCE STATUS: 0 - Asymptomatic  There were no vitals taken for this visit.  There were no vitals filed for this visit.   Physical Exam HENT:     Head: Normocephalic and atraumatic.     Mouth/Throat:     Pharynx: No oropharyngeal exudate.  Eyes:     Pupils: Pupils are equal, round, and reactive to light.  Cardiovascular:     Rate and Rhythm: Normal rate and regular rhythm.  Pulmonary:     Effort: No respiratory distress.     Breath sounds: No wheezing.  Abdominal:     General: Bowel sounds are normal. There is no distension.     Palpations: Abdomen is soft. There is no mass.     Tenderness: There is no abdominal tenderness. There is no guarding or rebound.  Musculoskeletal:        General: No tenderness. Normal range of motion.     Cervical back: Normal range of motion and neck supple.  Skin:    General: Skin is warm.  Neurological:     Mental Status: She is alert and oriented to person, place, and time.  Psychiatric:        Mood and Affect: Affect normal.     LABORATORY DATA:  I have reviewed the data as listed    Component Value Date/Time   NA 138 11/19/2020 0430   NA 142 06/04/2014 1443   K 3.7 11/19/2020 0430   K 4.1 06/04/2014 1443   CL 106  11/19/2020 0430   CL 108 (H) 06/04/2014 1443   CO2 26 11/19/2020 0430   CO2 26 06/04/2014 1443   GLUCOSE 145 (H) 11/19/2020 0430   GLUCOSE 99 06/04/2014 1443   BUN 22 11/19/2020 0430   BUN 18 06/04/2014 1443   CREATININE 0.82 11/19/2020 0430   CREATININE 0.92 06/04/2014 1443   CALCIUM 9.7 11/19/2020 0430   CALCIUM 9.0 06/04/2014 1443   PROT 6.8 12/20/2015 1757   PROT 6.8 06/04/2014 1443   ALBUMIN 4.1 12/20/2015 1757   ALBUMIN 3.4 06/04/2014 1443  AST 28 12/20/2015 1757   AST 25 06/04/2014 1443   ALT 19 12/20/2015 1757   ALT 23 06/04/2014 1443   ALKPHOS 51 12/20/2015 1757   ALKPHOS 52 06/04/2014 1443   BILITOT 0.8 12/20/2015 1757   BILITOT 0.3 06/04/2014 1443   GFRNONAA >60 11/19/2020 0430   GFRNONAA >60 06/04/2014 1443   GFRNONAA 51 (L) 12/11/2013 1412   GFRAA >60 01/06/2020 1253   GFRAA >60 06/04/2014 1443   GFRAA 59 (L) 12/11/2013 1412    No results found for: SPEP, UPEP  Lab Results  Component Value Date   WBC 8.3 11/19/2020   NEUTROABS 6.1 11/19/2020   HGB 16.7 (H) 11/19/2020   HCT 50.2 (H) 11/19/2020   MCV 91.4 11/19/2020   PLT 216 11/19/2020      Chemistry      Component Value Date/Time   NA 138 11/19/2020 0430   NA 142 06/04/2014 1443   K 3.7 11/19/2020 0430   K 4.1 06/04/2014 1443   CL 106 11/19/2020 0430   CL 108 (H) 06/04/2014 1443   CO2 26 11/19/2020 0430   CO2 26 06/04/2014 1443   BUN 22 11/19/2020 0430   BUN 18 06/04/2014 1443   CREATININE 0.82 11/19/2020 0430   CREATININE 0.92 06/04/2014 1443      Component Value Date/Time   CALCIUM 9.7 11/19/2020 0430   CALCIUM 9.0 06/04/2014 1443   ALKPHOS 51 12/20/2015 1757   ALKPHOS 52 06/04/2014 1443   AST 28 12/20/2015 1757   AST 25 06/04/2014 1443   ALT 19 12/20/2015 1757   ALT 23 06/04/2014 1443   BILITOT 0.8 12/20/2015 1757   BILITOT 0.3 06/04/2014 1443          ASSESSMENT & PLAN:   Erythrocytosis # ERYTHROCYTOSIS-  Unclear etiology. 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  Today hematocrit is 50/[goal < 45]; however phlebotomy again attempted in the clinic unable to draw more than 100 cc because IV access.  #With regards to dizzy spells-I suspect this is secondary to vertigo; and not related to her elevated hematocrit/erythrocytosis.  Recommend meclizine 50 mg x 1 p.o.   # Elevated blood pressure-initially elevated in the emergency room ~200s; however currently improved and 170s s/p antihypertensives in the emergency room.  Continue monitoring at home.  # IV access: Poor IV access.  Hold off a port at this time.  If worse port could be considered.  # DISPOSITION: #Spoke to patient's facility [facility]-patient will need to be admitted to assisted living space given the need for supervision for the next few days given the ongoing vertigo.  Addendum: About an hour post meclizine-patient's symptoms improved; patient was given a prescription for meclizine/sent over to [facility].  We will follow-up with me as planned  # 25 minutes face-to-face with the patient discussing the above plan of care; more than 50% of time spent on prognosis/ natural history; counseling and coordination.    Cc; Dr.Lateef/Dr.Sparks    ;Cammie Sickle, MD 11/19/2020 4:17 PM

## 2020-11-19 NOTE — Telephone Encounter (Signed)
Left vm for Michele Andrade at [facility] to return my phone call.  Michele Andrade returned my phone call at 1150. Explained to Michele Andrade - that patient was d/c from the ER. Pt was sent to ER for dizziness and was found to have hgb of 16.7/ hct 50.2.  pt d/c from ER and sent to the cancer center to manage the phlebotomy as outpatient. Phlebotomy has been attempted but unsuccessful. Per cancer center nursing in infusion only 100 ml was withdrawn and multiple IV access attemps have been made. Dr. Rogue Bussing feels that the patient may have vertigo. He could tentatively manage the symptoms with meclizine, but patient will need her care monitored/closely guarded once she is d/c from our facility to prevent falls. We reached out to the manager at [facility] to discuss goals of care and to see if one on one monitoring or nursing assessments could be frequently done once patient returns back to her private residence at [facility]. Unfortunately, the facility does not have a way to get her higher level of care there at the facility until at least Saturday due to high census at facility. Pt could go to assisted living by this would require Dr. Rogue Bussing to complete an FL2 form. Another option is for the patient to higher a Chiropodist.  Dr. Rogue Bussing informed of the above conversation. At this time, Md requests that RN reach out to bedplacement to see if there in bedplacement available for dx of acute vertigo

## 2020-11-19 NOTE — ED Triage Notes (Signed)
PER EMS: pt reports feeling dizzy prior to going to bed night last. She woke up this morning and her dizziness got worse when she tried going to the restroom associated with HA. No LOC, no n/v/d. A&OX4.  BP: 184/86, HR-58 sinus brady, O2-99% RA.

## 2020-11-19 NOTE — Patient Instructions (Signed)
[facility]  Discharge Instructions: Thank you for choosing [facility] to provide your oncology and hematology care.  If you have a lab appointment with the Cancer Center, please go directly to the Cancer Center and check in at the registration area.  Wear comfortable clothing and clothing appropriate for easy access to any Portacath or PICC line.   We strive to give you quality time with your provider. You may need to reschedule your appointment if you arrive late (15 or more minutes).  Arriving late affects you and other patients whose appointments are after yours.  Also, if you miss three or more appointments without notifying the office, you may be dismissed from the clinic at the provider's discretion.      For prescription refill requests, have your pharmacy contact our office and allow 72 hours for refills to be completed.      To help prevent nausea and vomiting after your treatment, we encourage you to take your nausea medication as directed.  BELOW ARE SYMPTOMS THAT SHOULD BE REPORTED IMMEDIATELY: *FEVER GREATER THAN 100.4 F (38 C) OR HIGHER *CHILLS OR SWEATING *NAUSEA AND VOMITING THAT IS NOT CONTROLLED WITH YOUR NAUSEA MEDICATION *UNUSUAL SHORTNESS OF BREATH *UNUSUAL BRUISING OR BLEEDING *URINARY PROBLEMS (pain or burning when urinating, or frequent urination) *BOWEL PROBLEMS (unusual diarrhea, constipation, pain near the anus) TENDERNESS IN MOUTH AND THROAT WITH OR WITHOUT PRESENCE OF ULCERS (sore throat, sores in mouth, or a toothache) UNUSUAL RASH, SWELLING OR PAIN  UNUSUAL VAGINAL DISCHARGE OR ITCHING   Items with * indicate a potential emergency and should be followed up as soon as possible or go to the Emergency Department if any problems should occur.  Please show the CHEMOTHERAPY ALERT CARD or IMMUNOTHERAPY ALERT CARD at check-in to the Emergency Department and triage nurse.  Should you have questions after your  visit or need to cancel or reschedule your appointment, please contact [facility]  [phone] and follow the prompts.  Office hours are 8:00 a.m. to 4:30 p.m. Monday - Friday. Please note that voicemails left after 4:00 p.m. may not be returned until the following business day.  We are closed weekends and major holidays. You have access to a nurse at all times for urgent questions. Please call the main number to the clinic [phone] and follow the prompts.  For any non-urgent questions, you may also contact your provider using MyChart. We now offer e-Visits for anyone 18 and older to request care online for non-urgent symptoms. For details visit [email or web address].   Also download the MyChart app! Go to the app store, search "MyChart", open the app, select [facility], and log in with your MyChart username and password.  Due to Covid, a mask is required upon entering the hospital/clinic. If you do not have a mask, one will be given to you upon arrival. For doctor visits, patients may have 1 support person aged 18 or older with them. For treatment visits, patients cannot have anyone with them due to current Covid guidelines and our immunocompromised population.  

## 2020-11-19 NOTE — Progress Notes (Signed)
Pt came from being discharged from ER for dizziness to have phleb performed of 300 cc per Dr. Jacinto Reap .between myself & Haywood Pao- only able to withdraw 100 ml of blood after multiple IV attempts. Pt still complains of dizziness. MD in infusion to evaluate. MD ordered Meclizine 50 mg to be given here and to observe for improvement. After observation period and resting- pt had improvement. Eric Form had spoke with [facility] assisted living, where pt will be going back to and also arraigned for Meclizine rx for pt to have. Transportation was arraigned to pick up pt and bring her back to [facility] assisted living.  Pt discharged stable.

## 2020-11-19 NOTE — Telephone Encounter (Signed)
meclizine 25 mg 2 pills every 8 hours x1 day; and then 1 pill every 8 hx 1 day;if improved then 1 pill every 6-8 hours as needed   Prescription sent to [facility] pharmacy per v/o Dr. Rogue Bussing. Hand off given go [facility] [place].

## 2020-11-19 NOTE — ED Notes (Signed)
PT discharged to the cancer center to phlebotomy. PT understands what will take place and staff will assist in getting her there. IV remains in place.

## 2020-11-19 NOTE — ED Provider Notes (Signed)
[facility] Emergency Department Provider Note  ____________________________________________   Event Date/Time   First MD Initiated Contact with Patient 11/19/20 [phone]     (approximate)  I have reviewed the triage vital signs and the nursing notes.   HISTORY  Chief Complaint Dizziness    HPI Michele Andrade is a 80 y.o. female  here with dizziness. Pt reports that she had a very stressful day today. She was trying to relax this evening when she began feeling lightheaded and dizzy. She felt somewhat off balance, though not like the room was spinning. She then became somewhat anxious and began having a dull, aching, frontal headache. Since then, she's had ongoing dizziness. No CP, SOB. No vision changes. No focal numbness or weakness. H/o HTN and has had HA with HTN in the past, btu not dizziness. Denies any tinnitus or sinus congestion or ear pressure. No specific alleviating or aggravating factors.       Past Medical History:  Diagnosis Date   Breast cancer ([place]) 1999   right breast ca   Erythrocytosis 09/30/2014   Hypertension     Patient Active Problem List   Diagnosis Date Noted   Breast CA ([place]) 12/17/2014   Colon polyp 12/17/2014   HLD (hyperlipidemia) 12/17/2014   BP (high blood pressure) 12/17/2014   Headache, migraine 12/17/2014   Osteopenia 12/17/2014   Erythrocytosis 09/30/2014   Polycythemia 11/27/2013    Past Surgical History:  Procedure Laterality Date   MASTECTOMY Right 1999   rotator cuff      Prior to Admission medications   Medication Sig Start Date End Date Taking? Authorizing Provider  amLODipine (NORVASC) 2.5 MG tablet Take 1 tablet by mouth at bedtime.  11/08/16   [provider]  aspirin 81 MG EC tablet Take 81 mg by mouth daily.     [provider]  Calcium Carbonate-Vitamin D 600-400 MG-UNIT per tablet Take 1 tablet by mouth 2 (two) times daily.     [provider]  cloNIDine (CATAPRES) 0.2  MG tablet Take 1 tablet by mouth 3 (three) times daily. 07/21/16   [provider]  Coenzyme Q10 (CO Q-10) 100 MG CAPS Take 100 mg by mouth every morning.     [provider]  fluticasone (FLONASE) 50 MCG/ACT nasal spray Place 1 spray into both nostrils at bedtime. 07/22/19   [provider]  hydrALAZINE (APRESOLINE) 50 MG tablet Take 1 tablet by mouth 3 (three) times daily. 10/02/17   [provider]  lisinopril (PRINIVIL,ZESTRIL) 10 MG tablet Take 10 mg by mouth 3 (three) times daily.  06/03/14   [provider]  MAGNESIUM GLYCINATE PLUS PO Take 200 mg by mouth every evening.     [provider]  Multiple Vitamin (MULTI-VITAMINS) TABS Take 1 tablet by mouth 2 (two) times daily.     [provider]  Omega-3 Fatty Acids (FISH OIL) 1000 MG CAPS Take 1,000 mg by mouth 2 (two) times daily.     [provider]  propranolol ER (INDERAL LA) 80 MG 24 hr capsule Take 80 mg by mouth every morning. 11/07/15   [provider]  riboflavin (VITAMIN B-2) 100 MG TABS tablet Take 100 mg by mouth daily.     [provider]  TURMERIC PO Take 1 tablet by mouth 2 (two) times daily.    [provider]    Allergies Patient has no allergy information on record.  Family History  Problem Relation Age of Onset  Breast cancer Mother 67    Social History Social History   Tobacco Use   Smoking status: Never   Smokeless tobacco: Never  Substance Use Topics   Alcohol use: Yes    Alcohol/week: 1.0 standard drink    Types: 1 Glasses of wine per week    Comment: BID prn    Review of Systems  Review of Systems  Constitutional:  Positive for fatigue. Negative for fever.  HENT:  Negative for congestion and sore throat.   Eyes:  Negative for visual disturbance.  Respiratory:  Negative for cough and shortness of breath.   Cardiovascular:  Negative for chest pain.  Gastrointestinal:  Negative for abdominal pain, diarrhea,  nausea and vomiting.  Genitourinary:  Negative for flank pain.  Musculoskeletal:  Negative for back pain and neck pain.  Skin:  Negative for rash and wound.  Neurological:  Positive for dizziness and headaches. Negative for weakness.    ____________________________________________  PHYSICAL EXAM:      VITAL SIGNS: ED Triage Vitals  Enc Vitals Group     BP 11/19/20 0424 (!) 188/82     Pulse Rate 11/19/20 0424 64     Resp 11/19/20 0424 19     Temp 11/19/20 0438 98.6 F (37 C)     Temp Source 11/19/20 0424 Oral     SpO2 11/19/20 0424 96 %     Weight --      Height --      Head Circumference --      Peak Flow --      Pain Score 11/19/20 0433 0     Pain Loc --      Pain Edu? --      Excl. in [place]? --      Physical Exam Vitals and nursing note reviewed.  Constitutional:      General: She is not in acute distress.    Appearance: She is well-developed.  HENT:     Head: Normocephalic and atraumatic.  Eyes:     Conjunctiva/sclera: Conjunctivae normal.  Cardiovascular:     Rate and Rhythm: Normal rate and regular rhythm.     Heart sounds: Normal heart sounds.  Pulmonary:     Effort: Pulmonary effort is normal. No respiratory distress.     Breath sounds: No wheezing.  Abdominal:     General: There is no distension.  Musculoskeletal:     Cervical back: Neck supple.  Skin:    General: Skin is warm.     Capillary Refill: Capillary refill takes less than 2 seconds.     Findings: No rash.  Neurological:     Mental Status: She is alert and oriented to person, place, and time.     Motor: No abnormal muscle tone.     Comments: Neurological Exam:  Mental Status: Alert and oriented to person, place, and time. Attention and concentration normal. Speech clear. Recent memory is intact. Cranial Nerves: Visual fields grossly intact. EOMI and PERRLA. No nystagmus noted. Facial sensation intact at forehead, maxillary cheek, and chin/mandible bilaterally. No facial asymmetry or weakness.  Hearing grossly normal. Uvula is midline, and palate elevates symmetrically. Normal SCM and trapezius strength. Tongue midline without fasciculations. Motor: Muscle strength 5/5 in proximal and distal UE and LE bilaterally. No pronator drift. Muscle tone normal.  Sensation: Intact to light touch in upper and lower extremities distally bilaterally.  Gait: Deferred. Coordination: Normal FTN bilaterally.          ____________________________________________   LABS (all labs  ordered are listed, but only abnormal results are displayed)  Labs Reviewed  CBC WITH DIFFERENTIAL/PLATELET - Abnormal; Notable for the following components:      Result Value   RBC 5.49 (*)    Hemoglobin 16.7 (*)    HCT 50.2 (*)    All other components within normal limits  BASIC METABOLIC PANEL - Abnormal; Notable for the following components:   Glucose, Bld 145 (*)    All other components within normal limits  TROPONIN I (HIGH SENSITIVITY)  TROPONIN I (HIGH SENSITIVITY)    ____________________________________________  EKG: Normal sinus rhythm, VR 59. PR 176 QRS 137, QTc 478. No acute st elevations or depressions. ________________________________________  RADIOLOGY All imaging, including plain films, CT scans, and ultrasounds, independently reviewed by me, and interpretations confirmed via formal radiology reads.  ED MD interpretation:   CT Head: [place]  Official radiology report(s): CT Head Wo Contrast  Result Date: 11/19/2020 CLINICAL DATA:  80 year old female with history of dizziness. EXAM: CT HEAD WITHOUT CONTRAST TECHNIQUE: Contiguous axial images were obtained from the base of the skull through the vertex without intravenous contrast. COMPARISON:  Head CT 12/20/2015. FINDINGS: Brain: Mild cerebral and cerebellar atrophy. Patchy and confluent areas of decreased attenuation are noted throughout the deep and periventricular white matter of the cerebral hemispheres bilaterally, compatible with chronic  microvascular ischemic disease. No evidence of acute infarction, hemorrhage, hydrocephalus, extra-axial collection or mass lesion/mass effect. Vascular: No hyperdense vessel or unexpected calcification. Skull: Normal. Negative for fracture or focal lesion. Sinuses/Orbits: No acute finding. Other: None. IMPRESSION: 1. No acute intracranial abnormalities. 2. Mild cerebral atrophy with chronic microvascular ischemic changes in the cerebral white matter, as above. Electronically Signed   By: Vinnie Langton M.D.   On: 11/19/2020 07:01    ____________________________________________  PROCEDURES   Procedure(s) performed (including Critical Care):  Procedures  ____________________________________________  INITIAL IMPRESSION / MDM / [place] / ED COURSE  As part of my medical decision making, I reviewed the following data within the [facility] notes reviewed and incorporated, Old chart reviewed, Notes from prior ED visits, and [place] Controlled Substance Database       *THOMAS RHUDE was evaluated in Emergency Department on 11/19/2020 for the symptoms described in the history of present illness. She was evaluated in the context of the global COVID-19 pandemic, which necessitated consideration that the patient might be at risk for infection with the SARS-CoV-2 virus that causes COVID-19. Institutional protocols and algorithms that pertain to the evaluation of patients at risk for COVID-19 are in a state of rapid change based on information released by regulatory bodies including the CDC and federal and state organizations. These policies and algorithms were followed during the patient's care in the ED.  Some ED evaluations and interventions may be delayed as a result of limited staffing during the pandemic.*     Medical Decision Making:  80 year old female here with mild dizziness and headache.  Suspect symptomatic hypertension, possibly in the setting of  erythrocytosis.  Hemoglobin is 16.7 with hematocrit of 50.2, which is significantly above her baseline.  Otherwise, she has no cerebellar findings or other abnormalities on full neurological examination.  CT head obtained, reviewed, shows no acute stroke or other abnormalities.  EKG is nonischemic and trop neg, do not suspect ACS (and has no CP). BMP unremarkable. She feels better with blood pressure control and is able to ambulate independently.  I discussed the case with Dr. Burlene Arnt her oncologist.  He  would recommend therapeutic phlebotomy with removal of 350 cc.  I have asked the charge nurse to see if this can be arranged in the ED.  If patient is able to receive phlebotomy and feels symptomatically improved, feel she can be further managed as an outpatient. Dr. Cheri Fowler to follow-up re: ability to provide phlebotomy in ED.   ____________________________________________  FINAL CLINICAL IMPRESSION(S) / ED DIAGNOSES  Final diagnoses:  Lightheadedness  Primary hypertension     MEDICATIONS GIVEN DURING THIS VISIT:  Medications - No data to display   ED Discharge Orders     None        Note:  This document was prepared using Dragon voice recognition software and may include unintentional dictation errors.   Duffy Bruce, MD 11/19/20 [phone]

## 2020-11-27 ENCOUNTER — Ambulatory Visit: Payer: Medicare Other | Admitting: Internal Medicine

## 2020-11-27 ENCOUNTER — Other Ambulatory Visit: Payer: Medicare Other

## 2020-12-01 ENCOUNTER — Other Ambulatory Visit: Payer: Medicare Other

## 2020-12-01 ENCOUNTER — Other Ambulatory Visit: Payer: Self-pay

## 2020-12-01 ENCOUNTER — Inpatient Hospital Stay: Payer: Medicare Other

## 2020-12-01 VITALS — BP 147/72 | HR 54 | Temp 98.2°F | Resp 17

## 2020-12-01 DIAGNOSIS — D751 Secondary polycythemia: Secondary | ICD-10-CM

## 2020-12-01 LAB — BASIC METABOLIC PANEL
Anion gap: 10 (ref 5–15)
BUN: 22 mg/dL (ref 8–23)
CO2: 25 mmol/L (ref 22–32)
Calcium: 9.3 mg/dL (ref 8.9–10.3)
Chloride: 101 mmol/L (ref 98–111)
Creatinine, Ser: 0.77 mg/dL (ref 0.44–1.00)
GFR, Estimated: >60 mL/min (ref >60)
Glucose, Bld: 87 mg/dL (ref 70–99)
Potassium: 4.2 mmol/L (ref 3.5–5.1)
Sodium: 136 mmol/L (ref 135–145)

## 2020-12-01 LAB — CBC WITH DIFFERENTIAL/PLATELET
Abs Immature Granulocytes: 0.02 10*3/uL (ref 0.00–0.07)
Basophils Absolute: 0 10*3/uL (ref 0.0–0.1)
Basophils Relative: 0 %
Eosinophils Absolute: 0.1 10*3/uL (ref 0.0–0.5)
Eosinophils Relative: 1 %
HCT: 48.7 % — ABNORMAL HIGH (ref 36.0–46.0)
Hemoglobin: 16.2 g/dL — ABNORMAL HIGH (ref 12.0–15.0)
Immature Granulocytes: 0 %
Lymphocytes Relative: 17 %
Lymphs Abs: 1.5 10*3/uL (ref 0.7–4.0)
MCH: 31.2 pg (ref 26.0–34.0)
MCHC: 33.3 g/dL (ref 30.0–36.0)
MCV: 93.8 fL (ref 80.0–100.0)
Monocytes Absolute: 0.7 10*3/uL (ref 0.1–1.0)
Monocytes Relative: 8 %
Neutro Abs: 6.5 10*3/uL (ref 1.7–7.7)
Neutrophils Relative %: 74 %
Platelets: 180 10*3/uL (ref 150–400)
RBC: 5.19 MIL/uL — ABNORMAL HIGH (ref 3.87–5.11)
RDW: 12.5 % (ref 11.5–15.5)
WBC: 8.8 10*3/uL (ref 4.0–10.5)
nRBC: 0 % (ref 0.0–0.2)

## 2020-12-01 NOTE — Progress Notes (Signed)
Pt here for phlebotomy. 1st peripheral site obtained 40 ml. 2nd IV site was able to obtain 300 ml successfully. Pt tolerated procedure well. Pt accepted a drink post procedure.VSS at time of discharge to home.

## 2020-12-01 NOTE — Patient Instructions (Signed)

## 2020-12-09 ENCOUNTER — Ambulatory Visit
Admission: RE | Admit: 2020-12-09 | Discharge: 2020-12-09 | Disposition: A | Discharge status: Home / Self Care | Payer: Medicare Other | Source: Ambulatory Visit | Attending: Internal Medicine | Admitting: Internal Medicine

## 2020-12-09 ENCOUNTER — Other Ambulatory Visit: Payer: Self-pay

## 2020-12-09 DIAGNOSIS — Z1231 Encounter for screening mammogram for malignant neoplasm of breast: Secondary | ICD-10-CM | POA: Diagnosis not present

## 2021-01-14 ENCOUNTER — Other Ambulatory Visit: Payer: Medicare Other

## 2021-01-22 ENCOUNTER — Other Ambulatory Visit: Payer: Self-pay

## 2021-01-22 DIAGNOSIS — D751 Secondary polycythemia: Secondary | ICD-10-CM

## 2021-01-26 ENCOUNTER — Other Ambulatory Visit: Payer: Medicare Other

## 2021-01-26 ENCOUNTER — Ambulatory Visit: Payer: Medicare Other | Admitting: Internal Medicine

## 2021-01-27 ENCOUNTER — Inpatient Hospital Stay (HOSPITAL_BASED_OUTPATIENT_CLINIC_OR_DEPARTMENT_OTHER): Payer: Medicare Other | Admitting: Internal Medicine

## 2021-01-27 ENCOUNTER — Inpatient Hospital Stay: Payer: Medicare Other

## 2021-01-27 ENCOUNTER — Encounter: Payer: Self-pay | Admitting: Internal Medicine

## 2021-01-27 ENCOUNTER — Inpatient Hospital Stay: Payer: Medicare Other | Attending: Internal Medicine

## 2021-01-27 ENCOUNTER — Other Ambulatory Visit: Payer: Self-pay

## 2021-01-27 VITALS — BP 134/63 | HR 55

## 2021-01-27 DIAGNOSIS — Z7982 Long term (current) use of aspirin: Secondary | ICD-10-CM | POA: Insufficient documentation

## 2021-01-27 DIAGNOSIS — Z79899 Other long term (current) drug therapy: Secondary | ICD-10-CM | POA: Diagnosis not present

## 2021-01-27 DIAGNOSIS — Z9011 Acquired absence of right breast and nipple: Secondary | ICD-10-CM | POA: Insufficient documentation

## 2021-01-27 DIAGNOSIS — Z853 Personal history of malignant neoplasm of breast: Secondary | ICD-10-CM | POA: Diagnosis not present

## 2021-01-27 DIAGNOSIS — D751 Secondary polycythemia: Secondary | ICD-10-CM | POA: Insufficient documentation

## 2021-01-27 DIAGNOSIS — R519 Headache, unspecified: Secondary | ICD-10-CM | POA: Insufficient documentation

## 2021-01-27 DIAGNOSIS — Z803 Family history of malignant neoplasm of breast: Secondary | ICD-10-CM | POA: Insufficient documentation

## 2021-01-27 DIAGNOSIS — I10 Essential (primary) hypertension: Secondary | ICD-10-CM | POA: Insufficient documentation

## 2021-01-27 LAB — BASIC METABOLIC PANEL
Anion gap: 8 (ref 5–15)
BUN: 22 mg/dL (ref 8–23)
CO2: 26 mmol/L (ref 22–32)
Calcium: 9.3 mg/dL (ref 8.9–10.3)
Chloride: 103 mmol/L (ref 98–111)
Creatinine, Ser: 0.75 mg/dL (ref 0.44–1.00)
GFR, Estimated: >60 mL/min (ref >60)
Glucose, Bld: 71 mg/dL (ref 70–99)
Potassium: 4 mmol/L (ref 3.5–5.1)
Sodium: 137 mmol/L (ref 135–145)

## 2021-01-27 LAB — CBC WITH DIFFERENTIAL/PLATELET
Abs Immature Granulocytes: 0.02 10*3/uL (ref 0.00–0.07)
Basophils Absolute: 0.1 10*3/uL (ref 0.0–0.1)
Basophils Relative: 1 %
Eosinophils Absolute: 0.1 10*3/uL (ref 0.0–0.5)
Eosinophils Relative: 1 %
HCT: 49.9 % — ABNORMAL HIGH (ref 36.0–46.0)
Hemoglobin: 16.4 g/dL — ABNORMAL HIGH (ref 12.0–15.0)
Immature Granulocytes: 0 %
Lymphocytes Relative: 23 %
Lymphs Abs: 1.8 10*3/uL (ref 0.7–4.0)
MCH: 30.5 pg (ref 26.0–34.0)
MCHC: 32.9 g/dL (ref 30.0–36.0)
MCV: 92.9 fL (ref 80.0–100.0)
Monocytes Absolute: 0.9 10*3/uL (ref 0.1–1.0)
Monocytes Relative: 11 %
Neutro Abs: 5.1 10*3/uL (ref 1.7–7.7)
Neutrophils Relative %: 64 %
Platelets: 200 10*3/uL (ref 150–400)
RBC: 5.37 MIL/uL — ABNORMAL HIGH (ref 3.87–5.11)
RDW: 12.7 % (ref 11.5–15.5)
WBC: 7.9 10*3/uL (ref 4.0–10.5)
nRBC: 0 % (ref 0.0–0.2)

## 2021-01-27 NOTE — Patient Instructions (Signed)

## 2021-01-27 NOTE — Progress Notes (Signed)
[facility] OFFICE PROGRESS NOTE  Patient Care Team: Idelle Crouch, MD as PCP - General (Internal Medicine) Cammie Sickle, MD as Medical Oncologist (Hematology and Oncology)   SUMMARY OF ONCOLOGIC HISTORY:  # April 2015- ERTHROCYTOSIS ; JAK-2/Exon- 12NEG; Epo-N; June 2017-  Phlebotomy every 6 weeks  # 1999-RIGHT BREAST- Mastec & ALND [[place]] & chemo; Tam x 5 years   INTERVAL HISTORY: Alone.  Ambulating independently.  A very pleasant 80 year old female patient with above history of symptomatic erythrocytosis [headaches elevated blood pressure] is here for follow-up.  In the interim patient had evaluation for vertigo-improved.  Patient last phlebotomy was aborted because of severe vertigo/poor IV access.  Denies any worsening headaches.  Denies any any nausea vomiting.  Review of Systems  Constitutional:  Positive for malaise/fatigue. Negative for chills, diaphoresis, fever and weight loss.  HENT:  Negative for nosebleeds and sore throat.   Eyes:  Negative for double vision.  Respiratory:  Negative for cough, hemoptysis, sputum production, shortness of breath and wheezing.   Cardiovascular:  Negative for chest pain, palpitations, orthopnea and leg swelling.  Gastrointestinal:  Negative for abdominal pain, blood in stool, constipation, diarrhea, heartburn, melena and vomiting.  Genitourinary:  Negative for dysuria, frequency and urgency.  Musculoskeletal:  Negative for back pain and joint pain.  Skin: Negative.  Negative for itching and rash.  Neurological:  Negative for tingling, focal weakness and weakness.  Endo/Heme/Allergies:  Does not bruise/bleed easily.  Psychiatric/Behavioral:  Negative for depression. The patient is not nervous/anxious and does not have insomnia.     PAST MEDICAL HISTORY :  Past Medical History:  Diagnosis Date  . Breast cancer ([place]) 1999   right breast ca  . Erythrocytosis 09/30/2014  . Hypertension     PAST  SURGICAL HISTORY :   Past Surgical History:  Procedure Laterality Date  . MASTECTOMY Right 1999  . rotator cuff      FAMILY HISTORY :   Family History  Problem Relation Age of Onset  . Breast cancer Mother 62    SOCIAL HISTORY:   Social History   Tobacco Use  . Smoking status: Never  . Smokeless tobacco: Never  Substance Use Topics  . Alcohol use: Yes    Alcohol/week: 1.0 standard drink    Types: 1 Glasses of wine per week    Comment: BID prn    ALLERGIES:  has no allergies on file.  MEDICATIONS:  Current Outpatient Medications  Medication Sig Dispense Refill  . amLODipine (NORVASC) 2.5 MG tablet Take 1 tablet by mouth at bedtime.     Marland Kitchen aspirin 81 MG EC tablet Take 81 mg by mouth daily.     . Calcium Carbonate-Vitamin D 600-400 MG-UNIT per tablet Take 1 tablet by mouth 2 (two) times daily.     . cloNIDine (CATAPRES) 0.2 MG tablet Take 1 tablet by mouth 3 (three) times daily.    . Coenzyme Q10 (CO Q-10) 100 MG CAPS Take 100 mg by mouth every morning.     . fluticasone (FLONASE) 50 MCG/ACT nasal spray Place 1 spray into both nostrils at bedtime.    . hydrALAZINE (APRESOLINE) 50 MG tablet Take 1 tablet by mouth 3 (three) times daily.    Marland Kitchen lisinopril (PRINIVIL,ZESTRIL) 10 MG tablet Take 10 mg by mouth 3 (three) times daily.     Marland Kitchen MAGNESIUM GLYCINATE PLUS PO Take 200 mg by mouth every evening.     . Multiple Vitamin (MULTI-VITAMINS) TABS Take 1 tablet by mouth  2 (two) times daily.     . Omega-3 Fatty Acids (FISH OIL) 1000 MG CAPS Take 1,000 mg by mouth 2 (two) times daily.     . propranolol ER (INDERAL LA) 80 MG 24 hr capsule Take 80 mg by mouth every morning.    . riboflavin (VITAMIN B-2) 100 MG TABS tablet Take 100 mg by mouth daily.     . TURMERIC PO Take 1 tablet by mouth 2 (two) times daily.    . meclizine (ANTIVERT) 25 MG tablet meclizine 25 mg 2 pills every 8 hours x1 day; and then 1 pill every 8 hx 1 day;if improved then 1 pill every 6-8 hours as needed for dizziness  (Patient not taking: Reported on 01/27/2021) 60 tablet 0   No current facility-administered medications for this visit.    PHYSICAL EXAMINATION: ECOG PERFORMANCE STATUS: 0 - Asymptomatic  BP (!) 162/74 (BP Location: Left Arm, Patient Position: Sitting)   Pulse 60   Temp 97.6 F (36.4 C) (Tympanic)   Resp 16   Wt 146 lb (66.2 kg)   SpO2 99%   BMI 26.70 kg/m   Filed Weights   01/27/21 1056  Weight: 146 lb (66.2 kg)     Physical Exam HENT:     Head: Normocephalic and atraumatic.     Mouth/Throat:     Pharynx: No oropharyngeal exudate.  Eyes:     Pupils: Pupils are equal, round, and reactive to light.  Cardiovascular:     Rate and Rhythm: Normal rate and regular rhythm.  Pulmonary:     Effort: No respiratory distress.     Breath sounds: No wheezing.  Abdominal:     General: Bowel sounds are normal. There is no distension.     Palpations: Abdomen is soft. There is no mass.     Tenderness: There is no abdominal tenderness. There is no guarding or rebound.  Musculoskeletal:        General: No tenderness. Normal range of motion.     Cervical back: Normal range of motion and neck supple.  Skin:    General: Skin is warm.  Neurological:     Mental Status: She is alert and oriented to person, place, and time.  Psychiatric:        Mood and Affect: Affect normal.     LABORATORY DATA:  I have reviewed the data as listed    Component Value Date/Time   NA 137 01/27/2021 1025   NA 142 06/04/2014 1443   K 4.0 01/27/2021 1025   K 4.1 06/04/2014 1443   CL 103 01/27/2021 1025   CL 108 (H) 06/04/2014 1443   CO2 26 01/27/2021 1025   CO2 26 06/04/2014 1443   GLUCOSE 71 01/27/2021 1025   GLUCOSE 99 06/04/2014 1443   BUN 22 01/27/2021 1025   BUN 18 06/04/2014 1443   CREATININE 0.75 01/27/2021 1025   CREATININE 0.92 06/04/2014 1443   CALCIUM 9.3 01/27/2021 1025   CALCIUM 9.0 06/04/2014 1443   PROT 6.8 12/20/2015 1757   PROT 6.8 06/04/2014 1443   ALBUMIN 4.1 12/20/2015  1757   ALBUMIN 3.4 06/04/2014 1443   AST 28 12/20/2015 1757   AST 25 06/04/2014 1443   ALT 19 12/20/2015 1757   ALT 23 06/04/2014 1443   ALKPHOS 51 12/20/2015 1757   ALKPHOS 52 06/04/2014 1443   BILITOT 0.8 12/20/2015 1757   BILITOT 0.3 06/04/2014 1443   GFRNONAA >60 01/27/2021 1025   GFRNONAA >60 06/04/2014 1443   GFRNONAA 51 (  L) 12/11/2013 1412   GFRAA >60 01/06/2020 1253   GFRAA >60 06/04/2014 1443   GFRAA 59 (L) 12/11/2013 1412    No results found for: SPEP, UPEP  Lab Results  Component Value Date   WBC 7.9 01/27/2021   NEUTROABS 5.1 01/27/2021   HGB 16.4 (H) 01/27/2021   HCT 49.9 (H) 01/27/2021   MCV 92.9 01/27/2021   PLT 200 01/27/2021      Chemistry      Component Value Date/Time   NA 137 01/27/2021 1025   NA 142 06/04/2014 1443   K 4.0 01/27/2021 1025   K 4.1 06/04/2014 1443   CL 103 01/27/2021 1025   CL 108 (H) 06/04/2014 1443   CO2 26 01/27/2021 1025   CO2 26 06/04/2014 1443   BUN 22 01/27/2021 1025   BUN 18 06/04/2014 1443   CREATININE 0.75 01/27/2021 1025   CREATININE 0.92 06/04/2014 1443      Component Value Date/Time   CALCIUM 9.3 01/27/2021 1025   CALCIUM 9.0 06/04/2014 1443   ALKPHOS 51 12/20/2015 1757   ALKPHOS 52 06/04/2014 1443   AST 28 12/20/2015 1757   AST 25 06/04/2014 1443   ALT 19 12/20/2015 1757   ALT 23 06/04/2014 1443   BILITOT 0.8 12/20/2015 1757   BILITOT 0.3 06/04/2014 1443          ASSESSMENT & PLAN:   Erythrocytosis # ERYTHROCYTOSIS-  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  Today hematocrit is 49/[goal < 45].  #Proceed with phlebotomy today and every 2 months.  # vertigo- s/p Epley maneuver; meclizine as needed.   # Elevated blood pressure-initially elevated in the emergency room ~160s- STABLE  # IV access: Poor IV access.  Hold off a port at this time.  If worse port could be considered.  # DISPOSITION: #  Phlebotomy today # 2 M H&H- possible phlebotomy # 4 M H&H- possible phlebotomy # 56 M- MD; labs- cbc/bmp; possible phlebotomy- Dr.B Cc; Dr.Lateef/Dr.Sparks    ;Cammie Sickle, MD 01/27/2021 11:56 AM

## 2021-01-27 NOTE — Assessment & Plan Note (Addendum)
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  Today hematocrit is 49/[goal < 45].  #Proceed with phlebotomy today and every 2 months.  # vertigo- s/p Epley maneuver; meclizine as needed.   # Elevated blood pressure-initially elevated in the emergency room ~160s- STABLE  # IV access: Poor IV access.  Hold off a port at this time.  If worse port could be considered.  # DISPOSITION: # Phlebotomy today # 2 M H&H- possible phlebotomy # 4 M H&H- possible phlebotomy # 68 M- MD; labs- cbc/bmp; possible phlebotomy- Dr.B Cc; Dr.Lateef/Dr.Sparks

## 2021-01-27 NOTE — Progress Notes (Signed)
Therapeutic phlebotomy performed; Removed 300 ml of blood from PIV in right forearm. Pt tolerated well. Accepted a beverage. VSS. Denies any weakness. Discharged to home.

## 2021-01-27 NOTE — Progress Notes (Signed)
Pt in for follow up, reports has had some right hip pain and is doing physical therapy at her assisted living facility.

## 2021-03-11 ENCOUNTER — Ambulatory Visit: Payer: Medicare Other | Admitting: Internal Medicine

## 2021-03-11 ENCOUNTER — Other Ambulatory Visit: Payer: Medicare Other

## 2021-03-24 ENCOUNTER — Inpatient Hospital Stay: Payer: Medicare Other | Attending: Internal Medicine

## 2021-03-24 ENCOUNTER — Other Ambulatory Visit: Payer: Self-pay

## 2021-03-24 ENCOUNTER — Inpatient Hospital Stay: Payer: Medicare Other

## 2021-03-24 ENCOUNTER — Other Ambulatory Visit: Payer: Self-pay | Admitting: *Deleted

## 2021-03-24 VITALS — BP 120/61 | HR 63 | Temp 98.8°F | Resp 18

## 2021-03-24 DIAGNOSIS — D751 Secondary polycythemia: Secondary | ICD-10-CM | POA: Diagnosis present

## 2021-03-24 LAB — HEMOGLOBIN: Hemoglobin: 16.4 g/dL — ABNORMAL HIGH (ref 12.0–15.0)

## 2021-03-24 LAB — HEMATOCRIT: HCT: 49.5 % — ABNORMAL HIGH (ref 36.0–46.0)

## 2021-03-24 NOTE — Progress Notes (Signed)
Performed therapeutic phlebotomy by removing 300 ml of blood via #20 angio cath L FA. Patient tolerated procedure well. VSS. Patient feeling well at time of discharge.

## 2021-03-24 NOTE — Patient Instructions (Signed)

## 2021-05-12 ENCOUNTER — Encounter: Payer: Self-pay | Admitting: Internal Medicine

## 2021-05-13 ENCOUNTER — Other Ambulatory Visit: Payer: Self-pay | Admitting: *Deleted

## 2021-05-13 DIAGNOSIS — D751 Secondary polycythemia: Secondary | ICD-10-CM

## 2021-05-19 ENCOUNTER — Inpatient Hospital Stay: Payer: Medicare HMO | Attending: Internal Medicine

## 2021-05-19 ENCOUNTER — Other Ambulatory Visit: Payer: Self-pay

## 2021-05-19 ENCOUNTER — Inpatient Hospital Stay: Payer: Medicare HMO

## 2021-05-19 VITALS — BP 116/60 | HR 58 | Temp 98.2°F | Resp 18

## 2021-05-19 DIAGNOSIS — D751 Secondary polycythemia: Secondary | ICD-10-CM

## 2021-05-19 LAB — HEMOGLOBIN AND HEMATOCRIT, BLOOD
HCT: 46.2 % — ABNORMAL HIGH (ref 36.0–46.0)
Hemoglobin: 15.3 g/dL — ABNORMAL HIGH (ref 12.0–15.0)

## 2021-05-19 NOTE — Progress Notes (Signed)
HCT 46. Phlebotomy performed today. Removed approximately 350 ml of blood. Pt accepted a beverage. Feeling well. Denies any side effects. VSS. Discharged to home after her observation period. Gave her her future appts.

## 2021-05-19 NOTE — Patient Instructions (Signed)
Therapeutic Phlebotomy °Therapeutic phlebotomy is the planned removal of blood from a person's body for the purpose of treating a medical condition. The procedure is lot like donating blood. Usually, about a pint (470 mL, or 0.47 L) of blood is removed. The average adult has 9-12 pints (4.3-5.7 L) of blood in his or her body. °Therapeutic phlebotomy may be used to treat the following medical conditions: °Hemochromatosis. This is a condition in which the blood contains too much iron. °Polycythemia vera. This is a condition in which the blood contains too many red blood cells. °Porphyria cutanea tarda. This is a disease in which an important part of hemoglobin is not made properly. It results in the buildup of abnormal amounts of porphyrins in the body. °Sickle cell disease. This is a condition in which the red blood cells form an abnormal crescent shape rather than a round shape. °Tell a health care provider about: °Any allergies you have. °All medicines you are taking, including vitamins, herbs, eye drops, creams, and over-the-counter medicines. °Any bleeding problems you have. °Any surgeries you have had. °Any medical conditions you have. °Whether you are pregnant or may be pregnant. °What are the risks? °Generally, this is a safe procedure. However, problems may occur, including: °Nausea or light-headedness. °Low blood pressure (hypotension). °Soreness, bleeding, swelling, or bruising at the needle insertion site. °Infection. °What happens before the procedure? °Ask your health care provider about: °Changing or stopping your regular medicines. This is especially important if you are taking diabetes medicines or blood thinners. °Taking medicines such as aspirin and ibuprofen. These medicines can thin your blood. Do not take these medicines unless your health care provider tells you to take them. °Taking over-the-counter medicines, vitamins, herbs, and supplements. °Wear clothing with sleeves that can be raised  above the elbow. °You may have a blood sample taken. °Your blood pressure, pulse rate, and breathing rate will be measured. °What happens during the procedure? ° °You may be given a medicine to numb the area (local anesthetic). °A tourniquet will be placed on your arm. °A needle will be put into one of your veins. °Tubing and a collection bag will be attached to the needle. °Blood will flow through the needle and tubing into the collection bag. °The collection bag will be placed lower than your arm so gravity can help the blood flow into the bag. °You may be asked to open and close your hand slowly and continually during the entire collection. °After the specified amount of blood has been removed from your body, the collection bag and tubing will be clamped. °The needle will be removed from your vein. °Pressure will be held on the needle site to stop the bleeding. °A bandage (dressing) will be placed over the needle insertion site. °The procedure may vary among health care providers and hospitals. °What happens after the procedure? °Your blood pressure, pulse rate, and breathing rate will be measured after the procedure. °You will be encouraged to drink fluids. °You will be encouraged to eat a snack to prevent a low blood sugar level. °Your recovery will be assessed and monitored. °Return to your normal activities as told by your health care provider. °Summary °Therapeutic phlebotomy is the planned removal of blood from a person's body for the purpose of treating a medical condition. °Therapeutic phlebotomy may be used to treat hemochromatosis, polycythemia vera, porphyria cutanea tarda, or sickle cell disease. °In the procedure, a needle is inserted and about a pint (470 mL, or 0.47 L) of blood is   removed. The average adult has 9-12 pints (4.3-5.7 L) of blood in the body. °This is generally a safe procedure, but it can sometimes cause problems such as nausea, light-headedness, or low blood pressure  (hypotension). °This information is not intended to replace advice given to you by your health care provider. Make sure you discuss any questions you have with your health care provider. °Document Revised: 10/21/2020 Document Reviewed: 10/21/2020 °Elsevier Patient Education © 2022 [employer]. ° °

## 2021-06-24 ENCOUNTER — Other Ambulatory Visit: Payer: Self-pay | Admitting: Family Medicine

## 2021-06-24 DIAGNOSIS — R2241 Localized swelling, mass and lump, right lower limb: Secondary | ICD-10-CM

## 2021-07-02 ENCOUNTER — Ambulatory Visit
Admission: RE | Admit: 2021-07-02 | Discharge: 2021-07-02 | Disposition: A | Discharge status: Home / Self Care | Payer: Medicare HMO | Source: Ambulatory Visit | Attending: Family Medicine | Admitting: Family Medicine

## 2021-07-02 ENCOUNTER — Other Ambulatory Visit: Payer: Self-pay

## 2021-07-02 DIAGNOSIS — R2241 Localized swelling, mass and lump, right lower limb: Secondary | ICD-10-CM | POA: Insufficient documentation

## 2021-07-14 ENCOUNTER — Inpatient Hospital Stay: Payer: Medicare HMO | Attending: Internal Medicine

## 2021-07-14 ENCOUNTER — Inpatient Hospital Stay (HOSPITAL_BASED_OUTPATIENT_CLINIC_OR_DEPARTMENT_OTHER): Payer: Medicare HMO | Admitting: Internal Medicine

## 2021-07-14 ENCOUNTER — Other Ambulatory Visit: Payer: Self-pay

## 2021-07-14 ENCOUNTER — Encounter: Payer: Self-pay | Admitting: Internal Medicine

## 2021-07-14 ENCOUNTER — Inpatient Hospital Stay: Payer: Medicare HMO

## 2021-07-14 VITALS — BP 122/70 | HR 58

## 2021-07-14 DIAGNOSIS — D751 Secondary polycythemia: Secondary | ICD-10-CM

## 2021-07-14 DIAGNOSIS — Z79899 Other long term (current) drug therapy: Secondary | ICD-10-CM | POA: Insufficient documentation

## 2021-07-14 LAB — CBC WITH DIFFERENTIAL/PLATELET
Abs Immature Granulocytes: 0.04 10*3/uL (ref 0.00–0.07)
Basophils Absolute: 0 10*3/uL (ref 0.0–0.1)
Basophils Relative: 0 %
Eosinophils Absolute: 0.1 10*3/uL (ref 0.0–0.5)
Eosinophils Relative: 1 %
HCT: 47.4 % — ABNORMAL HIGH (ref 36.0–46.0)
Hemoglobin: 15.6 g/dL — ABNORMAL HIGH (ref 12.0–15.0)
Immature Granulocytes: 1 %
Lymphocytes Relative: 19 %
Lymphs Abs: 1.5 10*3/uL (ref 0.7–4.0)
MCH: 29.3 pg (ref 26.0–34.0)
MCHC: 32.9 g/dL (ref 30.0–36.0)
MCV: 89.1 fL (ref 80.0–100.0)
Monocytes Absolute: 0.6 10*3/uL (ref 0.1–1.0)
Monocytes Relative: 7 %
Neutro Abs: 5.7 10*3/uL (ref 1.7–7.7)
Neutrophils Relative %: 72 %
Platelets: 205 10*3/uL (ref 150–400)
RBC: 5.32 MIL/uL — ABNORMAL HIGH (ref 3.87–5.11)
RDW: 13.7 % (ref 11.5–15.5)
WBC: 7.9 10*3/uL (ref 4.0–10.5)
nRBC: 0 % (ref 0.0–0.2)

## 2021-07-14 LAB — BASIC METABOLIC PANEL
Anion gap: 5 (ref 5–15)
BUN: 19 mg/dL (ref 8–23)
CO2: 26 mmol/L (ref 22–32)
Calcium: 9 mg/dL (ref 8.9–10.3)
Chloride: 104 mmol/L (ref 98–111)
Creatinine, Ser: 0.8 mg/dL (ref 0.44–1.00)
GFR, Estimated: >60 mL/min (ref >60)
Glucose, Bld: 145 mg/dL — ABNORMAL HIGH (ref 70–99)
Potassium: 3.8 mmol/L (ref 3.5–5.1)
Sodium: 135 mmol/L (ref 135–145)

## 2021-07-14 NOTE — Progress Notes (Signed)
Patient denies new problems/concerns today.   °

## 2021-07-14 NOTE — Progress Notes (Signed)
[facility] ?OFFICE PROGRESS NOTE ? ?Patient Care Team: ?Idelle Crouch, MD as PCP - General (Internal Medicine) ?Cammie Sickle, MD as Medical Oncologist (Hematology and Oncology) ? ? ?SUMMARY OF ONCOLOGIC HISTORY: ? ?# April 2015- ERTHROCYTOSIS ; JAK-2/Exon- 12NEG; Epo-N; June 2017-  Phlebotomy every 6 weeks ? ?# 1999-RIGHT BREAST- Mastec & ALND [[place]] & chemo; Tam x 5 years  ? ?INTERVAL HISTORY: Alone.  Ambulating independently. ? ?A very pleasant 81 year old female patient with above history of symptomatic erythrocytosis [headaches elevated blood pressure] is here for follow-up. ? ?Denies any worsening headaches.  Denies any any nausea vomiting. ? ?Review of Systems  ?Constitutional:  Positive for malaise/fatigue. Negative for chills, diaphoresis, fever and weight loss.  ?HENT:  Negative for nosebleeds and sore throat.   ?Eyes:  Negative for double vision.  ?Respiratory:  Negative for cough, hemoptysis, sputum production, shortness of breath and wheezing.   ?Cardiovascular:  Negative for chest pain, palpitations, orthopnea and leg swelling.  ?Gastrointestinal:  Negative for abdominal pain, blood in stool, constipation, diarrhea, heartburn, melena and vomiting.  ?Genitourinary:  Negative for dysuria, frequency and urgency.  ?Musculoskeletal:  Positive for back pain and joint pain.  ?Skin: Negative.  Negative for itching and rash.  ?Neurological:  Negative for tingling, focal weakness and weakness.  ?Endo/Heme/Allergies:  Does not bruise/bleed easily.  ?Psychiatric/Behavioral:  Negative for depression. The patient is not nervous/anxious and does not have insomnia.   ? ? ?PAST MEDICAL HISTORY :  ?Past Medical History:  ?Diagnosis Date  ? Breast cancer ([place]) 1999  ? right breast ca  ? Erythrocytosis 09/30/2014  ? Hypertension   ? Lipoma   ? ? ?PAST SURGICAL HISTORY :   ?Past Surgical History:  ?Procedure Laterality Date  ? MASTECTOMY Right 1999  ? rotator cuff    ? ? ?FAMILY HISTORY :    ?Family History  ?Problem Relation Age of Onset  ? Breast cancer Mother 33  ? ? ?SOCIAL HISTORY:   ?Social History  ? ?Tobacco Use  ? Smoking status: Never  ? Smokeless tobacco: Never  ?Substance Use Topics  ? Alcohol use: Yes  ?  Alcohol/week: 1.0 standard drink  ?  Types: 1 Glasses of wine per week  ?  Comment: BID prn  ? ? ?ALLERGIES:  is allergic to cephalexin. ? ?MEDICATIONS:  ?Current Outpatient Medications  ?Medication Sig Dispense Refill  ? amLODipine (NORVASC) 2.5 MG tablet Take 1 tablet by mouth at bedtime.     ? aspirin 81 MG EC tablet Take 81 mg by mouth daily.     ? Calcium Carbonate-Vitamin D 600-400 MG-UNIT per tablet Take 1 tablet by mouth 2 (two) times daily.     ? cloNIDine (CATAPRES) 0.2 MG tablet Take 1 tablet by mouth 3 (three) times daily.    ? Coenzyme Q10 (CO Q-10) 100 MG CAPS Take 100 mg by mouth every morning.     ? fluticasone (FLONASE) 50 MCG/ACT nasal spray Place 1 spray into both nostrils at bedtime.    ? hydrALAZINE (APRESOLINE) 50 MG tablet Take 1 tablet by mouth 3 (three) times daily.    ? lisinopril (PRINIVIL,ZESTRIL) 10 MG tablet Take 10 mg by mouth in the morning and at bedtime.    ? MAGNESIUM GLYCINATE PLUS PO Take 200 mg by mouth every evening.     ? Multiple Vitamin (MULTI-VITAMINS) TABS Take 1 tablet by mouth 2 (two) times daily.     ? Omega-3 Fatty Acids (FISH OIL) 1000 MG CAPS  Take 1,000 mg by mouth 2 (two) times daily.     ? propranolol ER (INDERAL LA) 80 MG 24 hr capsule Take 80 mg by mouth every morning.    ? TURMERIC PO Take 1 tablet by mouth 2 (two) times daily.    ? meclizine (ANTIVERT) 25 MG tablet meclizine 25 mg 2 pills every 8 hours x1 day; and then 1 pill every 8 hx 1 day;if improved then 1 pill every 6-8 hours as needed for dizziness (Patient not taking: Reported on 01/27/2021) 60 tablet 0  ? riboflavin (VITAMIN B-2) 100 MG TABS tablet Take 100 mg by mouth daily.     ? ?No current facility-administered medications for this visit.  ? ? ?PHYSICAL  EXAMINATION: ?ECOG PERFORMANCE STATUS: 0 - Asymptomatic ? ?BP 121/71   Pulse 71   Temp 99.1 ?F (37.3 ?C)   Resp 16   Wt 147 lb 6.4 oz (66.9 kg)   BMI 26.96 kg/m?  ? ?Filed Weights  ? 07/14/21 1400  ?Weight: 147 lb 6.4 oz (66.9 kg)  ? ? ? ?Physical Exam ?HENT:  ?   Head: Normocephalic and atraumatic.  ?   Mouth/Throat:  ?   Pharynx: No oropharyngeal exudate.  ?Eyes:  ?   Pupils: Pupils are equal, round, and reactive to light.  ?Cardiovascular:  ?   Rate and Rhythm: Normal rate and regular rhythm.  ?Pulmonary:  ?   Effort: No respiratory distress.  ?   Breath sounds: No wheezing.  ?Abdominal:  ?   General: Bowel sounds are normal. There is no distension.  ?   Palpations: Abdomen is soft. There is no mass.  ?   Tenderness: There is no abdominal tenderness. There is no guarding or rebound.  ?Musculoskeletal:     ?   General: No tenderness. Normal range of motion.  ?   Cervical back: Normal range of motion and neck supple.  ?Skin: ?   General: Skin is warm.  ?Neurological:  ?   Mental Status: She is alert and oriented to person, place, and time.  ?Psychiatric:     ?   Mood and Affect: Affect normal.  ? ? ? ?LABORATORY DATA:  ?I have reviewed the data as listed ?   ?Component Value Date/Time  ? NA 135 07/14/2021 1358  ? NA 142 06/04/2014 1443  ? K 3.8 07/14/2021 1358  ? K 4.1 06/04/2014 1443  ? CL 104 07/14/2021 1358  ? CL 108 (H) 06/04/2014 1443  ? CO2 26 07/14/2021 1358  ? CO2 26 06/04/2014 1443  ? GLUCOSE 145 (H) 07/14/2021 1358  ? GLUCOSE 99 06/04/2014 1443  ? BUN 19 07/14/2021 1358  ? BUN 18 06/04/2014 1443  ? CREATININE 0.80 07/14/2021 1358  ? CREATININE 0.92 06/04/2014 1443  ? CALCIUM 9.0 07/14/2021 1358  ? CALCIUM 9.0 06/04/2014 1443  ? PROT 6.8 12/20/2015 1757  ? PROT 6.8 06/04/2014 1443  ? ALBUMIN 4.1 12/20/2015 1757  ? ALBUMIN 3.4 06/04/2014 1443  ? AST 28 12/20/2015 1757  ? AST 25 06/04/2014 1443  ? ALT 19 12/20/2015 1757  ? ALT 23 06/04/2014 1443  ? ALKPHOS 51 12/20/2015 1757  ? ALKPHOS 52 06/04/2014  1443  ? BILITOT 0.8 12/20/2015 1757  ? BILITOT 0.3 06/04/2014 1443  ? GFRNONAA >60 07/14/2021 1358  ? GFRNONAA >60 06/04/2014 1443  ? GFRNONAA 51 (L) 12/11/2013 1412  ? GFRAA >60 01/06/2020 1253  ? GFRAA >60 06/04/2014 1443  ? GFRAA 59 (L) 12/11/2013 1412  ? ? ?  No results found for: SPEP, UPEP ? ?Lab Results  ?Component Value Date  ? WBC 7.9 07/14/2021  ? NEUTROABS 5.7 07/14/2021  ? HGB 15.6 (H) 07/14/2021  ? HCT 47.4 (H) 07/14/2021  ? MCV 89.1 07/14/2021  ? PLT 205 07/14/2021  ? ? ?  Chemistry   ?   ?Component Value Date/Time  ? NA 135 07/14/2021 1358  ? NA 142 06/04/2014 1443  ? K 3.8 07/14/2021 1358  ? K 4.1 06/04/2014 1443  ? CL 104 07/14/2021 1358  ? CL 108 (H) 06/04/2014 1443  ? CO2 26 07/14/2021 1358  ? CO2 26 06/04/2014 1443  ? BUN 19 07/14/2021 1358  ? BUN 18 06/04/2014 1443  ? CREATININE 0.80 07/14/2021 1358  ? CREATININE 0.92 06/04/2014 1443  ?    ?Component Value Date/Time  ? CALCIUM 9.0 07/14/2021 1358  ? CALCIUM 9.0 06/04/2014 1443  ? ALKPHOS 51 12/20/2015 1757  ? ALKPHOS 52 06/04/2014 1443  ? AST 28 12/20/2015 1757  ? AST 25 06/04/2014 1443  ? ALT 19 12/20/2015 1757  ? ALT 23 06/04/2014 1443  ? BILITOT 0.8 12/20/2015 1757  ? BILITOT 0.3 06/04/2014 1443  ?  ? ? ? ?  ? ?ASSESSMENT & PLAN:  ? ?Erythrocytosis ?# ERYTHROCYTOSIS-  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  Today hematocrit is 47/[goal < 45]. ? ?#Proceed with phlebotomy today and every 2 months. ? ?# Elevated blood pressure-initially elevated in the emergency room ~160s; today 120s-STABLE ? ?# IV access: Poor IV access.  Pt declines; Hold off a port at this time.  If worse port could be considered. ? ?# DISPOSITION: ?# Phlebotomy today ?# 2 M H&H- possible phlebotomy ?# 4 M H&H- possible phlebotomy ?# 63 M- MD; labs- cbc/bmp; possible phlebotomy- Dr.B ? ?Cc; Dr.Lateef/Dr.Sparks ? ? ? ?;Cammie Sickle, MD ?07/14/2021 4:13 PM ?

## 2021-07-14 NOTE — Assessment & Plan Note (Addendum)
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  Today hematocrit is 47/[goal < 45]. ? ?#Proceed with phlebotomy today and every 2 months. ? ?# Elevated blood pressure-initially elevated in the emergency room ~160s; today 120s-STABLE ? ?# IV access: Poor IV access.  Pt declines; Hold off a port at this time.  If worse port could be considered. ? ?# DISPOSITION: ?# Phlebotomy today ?# 2 M H&H- possible phlebotomy ?# 4 M H&H- possible phlebotomy ?# 59 M- MD; labs- cbc/bmp; possible phlebotomy- Dr.B ? ?Cc; Dr.Lateef/Dr.Sparks ?

## 2021-07-14 NOTE — Progress Notes (Signed)
Therapeutic phlebotomy performed in left forearm using 20g angiocath. 326m removed. Pt tolerated procedure well. Oral hydration provided. Vital signs stable at discharge.  ?

## 2021-08-31 ENCOUNTER — Other Ambulatory Visit: Payer: Self-pay | Admitting: Internal Medicine

## 2021-08-31 DIAGNOSIS — Z1231 Encounter for screening mammogram for malignant neoplasm of breast: Secondary | ICD-10-CM

## 2021-09-06 ENCOUNTER — Telehealth: Payer: Self-pay

## 2021-09-06 NOTE — Telephone Encounter (Signed)
Patient would like to have her lab/phlebotomy appt r/s from Wed (09/08/21) to Tue (09/07/21).  Please schedule and inform her of new appt time.   ?

## 2021-09-06 NOTE — Telephone Encounter (Signed)
Patient left a voicemail on clinical team phone with concerns of a new symptom that started today.  Started hearing an occasional "popping" sound episodes in right ear that last 4-5 seconds this morning going on thru the day ? ?BP elevated on home check at 168/90.   ? ?She is concerned that this could be related to the elevated hematocrit of 51.5 with PCP on 08/23/21.  She is scheduled for lab/phlebotomy at [facility] on 09/08/21 but doesn't want to wait that long. ? ?She did call her PCP office who has her scheduled to see the PA tomorrow (09/07/21) to evaluate.   ? ? ?

## 2021-09-06 NOTE — Telephone Encounter (Signed)
Please advise if the new symptoms could be related to the elevated hematocrit of 51.5 on 08/23/21. ?

## 2021-09-07 ENCOUNTER — Inpatient Hospital Stay: Payer: Medicare HMO | Attending: Internal Medicine

## 2021-09-07 ENCOUNTER — Encounter: Payer: Self-pay | Admitting: Internal Medicine

## 2021-09-07 ENCOUNTER — Other Ambulatory Visit: Payer: Medicare HMO

## 2021-09-07 DIAGNOSIS — D751 Secondary polycythemia: Secondary | ICD-10-CM | POA: Diagnosis not present

## 2021-09-07 NOTE — Progress Notes (Signed)
Pt presents for therapeutic phlebotomy. States that she had an appointment this AM with her PCP re: popping sound in her right ear for the last day or two. She stated that he ordered x-rays and physical therapy.  ?Therapeutic phlebotomy performed in left forearm using 20g angiocath. 323m removed. Pt tolerated well. Oral hydration provided. Vital signs stable at discharge.  ?

## 2021-09-07 NOTE — Patient Instructions (Signed)

## 2021-09-07 NOTE — Telephone Encounter (Signed)
MD approves phlebotomy only today, no labs.  Will use the labs drawn at [facility] (results in [place]) as parameters.   ?

## 2021-09-08 ENCOUNTER — Inpatient Hospital Stay: Payer: Medicare HMO

## 2021-09-10 ENCOUNTER — Telehealth: Payer: Self-pay | Admitting: Internal Medicine

## 2021-09-10 NOTE — Telephone Encounter (Signed)
LVM for pt with req r/s information .Cherylann Banas  ?

## 2021-11-02 ENCOUNTER — Encounter: Payer: Self-pay | Admitting: Internal Medicine

## 2021-11-02 ENCOUNTER — Inpatient Hospital Stay: Payer: Medicare HMO | Attending: Internal Medicine

## 2021-11-02 ENCOUNTER — Telehealth: Payer: Self-pay | Admitting: Internal Medicine

## 2021-11-02 ENCOUNTER — Inpatient Hospital Stay (HOSPITAL_BASED_OUTPATIENT_CLINIC_OR_DEPARTMENT_OTHER): Payer: Medicare HMO | Admitting: Internal Medicine

## 2021-11-02 ENCOUNTER — Inpatient Hospital Stay: Payer: Medicare HMO

## 2021-11-02 VITALS — BP 110/59 | HR 57 | Temp 98.8°F

## 2021-11-02 DIAGNOSIS — Z9011 Acquired absence of right breast and nipple: Secondary | ICD-10-CM | POA: Insufficient documentation

## 2021-11-02 DIAGNOSIS — D751 Secondary polycythemia: Secondary | ICD-10-CM | POA: Insufficient documentation

## 2021-11-02 DIAGNOSIS — Z853 Personal history of malignant neoplasm of breast: Secondary | ICD-10-CM | POA: Insufficient documentation

## 2021-11-02 DIAGNOSIS — R030 Elevated blood-pressure reading, without diagnosis of hypertension: Secondary | ICD-10-CM | POA: Insufficient documentation

## 2021-11-02 DIAGNOSIS — Z79899 Other long term (current) drug therapy: Secondary | ICD-10-CM | POA: Insufficient documentation

## 2021-11-02 LAB — HEMOGLOBIN AND HEMATOCRIT, BLOOD
HCT: 47 % — ABNORMAL HIGH (ref 36.0–46.0)
Hemoglobin: 15.4 g/dL — ABNORMAL HIGH (ref 12.0–15.0)

## 2021-11-02 NOTE — Telephone Encounter (Signed)
Per secure chat: just do every 8 week appt until the early part of next year- when she needs to see me  Appointments scheduled every 8 weeks until Feb 2024

## 2021-11-02 NOTE — Assessment & Plan Note (Addendum)
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  Today hematocrit is 47/[goal < 45].  # Proceed with phlebotomy today and every 8 W.   # Elevated blood pressure- ~160s; over stable-; defer to Dr. Judithann Sheen.  # IV access: Poor IV access.  Pt declines; Hold off a port at this time.  If worse port could be considered.  # DISPOSITION: # Phlebotomy today # 8 weeks as planned H&H- possible phlebotomy # 10 weeks M H&H- possible phlebotomy # 12 weeksH&H- possible phlebotomy # 14 weeks- MD; labs- cbc/bmp; possible phlebotomy- Dr.B  Cc; Dr.Lateef/Dr.Sparks

## 2021-11-10 ENCOUNTER — Other Ambulatory Visit: Payer: Medicare HMO

## 2021-12-13 ENCOUNTER — Ambulatory Visit
Admission: RE | Admit: 2021-12-13 | Discharge: 2021-12-13 | Disposition: A | Discharge status: Home / Self Care | Payer: Medicare HMO | Source: Ambulatory Visit | Attending: Internal Medicine | Admitting: Internal Medicine

## 2021-12-13 DIAGNOSIS — Z1231 Encounter for screening mammogram for malignant neoplasm of breast: Secondary | ICD-10-CM | POA: Diagnosis present

## 2021-12-28 ENCOUNTER — Inpatient Hospital Stay: Payer: Medicare HMO

## 2021-12-28 ENCOUNTER — Inpatient Hospital Stay: Payer: Medicare HMO | Attending: Internal Medicine

## 2021-12-28 ENCOUNTER — Inpatient Hospital Stay: Payer: Medicare HMO | Admitting: Internal Medicine

## 2021-12-28 DIAGNOSIS — D751 Secondary polycythemia: Secondary | ICD-10-CM | POA: Diagnosis present

## 2021-12-28 LAB — HEMOGLOBIN AND HEMATOCRIT, BLOOD
HCT: 46.5 % — ABNORMAL HIGH (ref 36.0–46.0)
Hemoglobin: 15.3 g/dL — ABNORMAL HIGH (ref 12.0–15.0)

## 2021-12-28 NOTE — Progress Notes (Signed)
Therapeutic phlebotomy performed in LFA using 20g angiocath. 37m removed. Pt tolerated procedure well. Oral hydration provided. Vital signs stable at discharge.

## 2021-12-28 NOTE — Patient Instructions (Signed)

## 2022-01-12 ENCOUNTER — Ambulatory Visit: Payer: Medicare HMO | Admitting: Internal Medicine

## 2022-01-12 ENCOUNTER — Other Ambulatory Visit: Payer: Medicare HMO

## 2022-02-22 ENCOUNTER — Ambulatory Visit: Payer: Medicare HMO | Admitting: Internal Medicine

## 2022-02-22 ENCOUNTER — Inpatient Hospital Stay: Payer: Medicare HMO

## 2022-02-22 ENCOUNTER — Inpatient Hospital Stay: Payer: Medicare HMO | Attending: Internal Medicine

## 2022-02-22 VITALS — BP 176/76 | HR 68 | Temp 98.8°F

## 2022-02-22 DIAGNOSIS — D751 Secondary polycythemia: Secondary | ICD-10-CM

## 2022-02-22 LAB — CBC WITH DIFFERENTIAL/PLATELET
Abs Immature Granulocytes: 0.03 10*3/uL (ref 0.00–0.07)
Basophils Absolute: 0 10*3/uL (ref 0.0–0.1)
Basophils Relative: 0 %
Eosinophils Absolute: 0.1 10*3/uL (ref 0.0–0.5)
Eosinophils Relative: 1 %
HCT: 47.8 % — ABNORMAL HIGH (ref 36.0–46.0)
Hemoglobin: 15.5 g/dL — ABNORMAL HIGH (ref 12.0–15.0)
Immature Granulocytes: 0 %
Lymphocytes Relative: 25 %
Lymphs Abs: 2.1 10*3/uL (ref 0.7–4.0)
MCH: 28.5 pg (ref 26.0–34.0)
MCHC: 32.4 g/dL (ref 30.0–36.0)
MCV: 88 fL (ref 80.0–100.0)
Monocytes Absolute: 0.7 10*3/uL (ref 0.1–1.0)
Monocytes Relative: 9 %
Neutro Abs: 5.4 10*3/uL (ref 1.7–7.7)
Neutrophils Relative %: 65 %
Platelets: 214 10*3/uL (ref 150–400)
RBC: 5.43 MIL/uL — ABNORMAL HIGH (ref 3.87–5.11)
RDW: 13.7 % (ref 11.5–15.5)
WBC: 8.4 10*3/uL (ref 4.0–10.5)
nRBC: 0 % (ref 0.0–0.2)

## 2022-02-22 LAB — BASIC METABOLIC PANEL
Anion gap: 8 (ref 5–15)
BUN: 22 mg/dL (ref 8–23)
CO2: 26 mmol/L (ref 22–32)
Calcium: 9.1 mg/dL (ref 8.9–10.3)
Chloride: 102 mmol/L (ref 98–111)
Creatinine, Ser: 0.84 mg/dL (ref 0.44–1.00)
GFR, Estimated: >60 mL/min (ref >60)
Glucose, Bld: 88 mg/dL (ref 70–99)
Potassium: 4.1 mmol/L (ref 3.5–5.1)
Sodium: 136 mmol/L (ref 135–145)

## 2022-02-22 NOTE — Progress Notes (Signed)
HCT 47.8 Performed phlebotomy by removing 300 ml of blood from Left forearm 20 g angiocath. Pt tolerated procedure well. Accepted a bevearage. VSS. Discharged to home.

## 2022-02-22 NOTE — Patient Instructions (Signed)

## 2022-04-19 ENCOUNTER — Inpatient Hospital Stay: Payer: Medicare HMO | Attending: Internal Medicine

## 2022-04-19 ENCOUNTER — Inpatient Hospital Stay: Payer: Medicare HMO

## 2022-04-19 DIAGNOSIS — D751 Secondary polycythemia: Secondary | ICD-10-CM | POA: Diagnosis not present

## 2022-04-19 LAB — HEMOGLOBIN AND HEMATOCRIT, BLOOD
HCT: 48.3 % — ABNORMAL HIGH (ref 36.0–46.0)
Hemoglobin: 15.6 g/dL — ABNORMAL HIGH (ref 12.0–15.0)

## 2022-06-13 ENCOUNTER — Other Ambulatory Visit: Payer: Self-pay | Admitting: *Deleted

## 2022-06-13 DIAGNOSIS — D751 Secondary polycythemia: Secondary | ICD-10-CM

## 2022-06-14 ENCOUNTER — Inpatient Hospital Stay: Payer: Medicare HMO | Attending: Internal Medicine

## 2022-06-14 ENCOUNTER — Encounter: Payer: Self-pay | Admitting: Internal Medicine

## 2022-06-14 ENCOUNTER — Inpatient Hospital Stay: Payer: Medicare HMO

## 2022-06-14 ENCOUNTER — Inpatient Hospital Stay (HOSPITAL_BASED_OUTPATIENT_CLINIC_OR_DEPARTMENT_OTHER): Payer: Medicare HMO | Admitting: Internal Medicine

## 2022-06-14 VITALS — BP 148/80 | HR 60 | Temp 97.7°F | Resp 16 | Wt 145.9 lb

## 2022-06-14 DIAGNOSIS — D751 Secondary polycythemia: Secondary | ICD-10-CM | POA: Diagnosis not present

## 2022-06-14 DIAGNOSIS — Z79899 Other long term (current) drug therapy: Secondary | ICD-10-CM | POA: Insufficient documentation

## 2022-06-14 LAB — HEMOGLOBIN: Hemoglobin: 15.7 g/dL — ABNORMAL HIGH (ref 12.0–15.0)

## 2022-06-14 LAB — HEMATOCRIT: HCT: 49.6 % — ABNORMAL HIGH (ref 36.0–46.0)

## 2022-06-14 NOTE — Progress Notes (Signed)
[facility] OFFICE PROGRESS NOTE  Patient Care Team: Idelle Crouch, MD as PCP - General (Internal Medicine) Cammie Sickle, MD as Medical Oncologist (Hematology and Oncology)   SUMMARY OF ONCOLOGIC HISTORY:  # April 2015- ERTHROCYTOSIS ; JAK-2/Exon- 12NEG; Epo-N; June 2017-  Phlebotomy every 8 weeks  # 1999-RIGHT BREAST- Mastec & ALND [[place]] & chemo; Tam x 5 years   INTERVAL HISTORY: Alone.  Ambulating independently.  A very pleasant 82year-old female patient with above history of symptomatic erythrocytosis [headaches elevated blood pressure] is here for follow-up.   Patient denies new problems/concerns today. Denies any worsening headaches.  Denies any any nausea vomiting.  States her blood pressures are better controlled.   Review of Systems  Constitutional:  Positive for malaise/fatigue. Negative for chills, diaphoresis, fever and weight loss.  HENT:  Negative for nosebleeds and sore throat.   Eyes:  Negative for double vision.  Respiratory:  Negative for cough, hemoptysis, sputum production, shortness of breath and wheezing.   Cardiovascular:  Negative for chest pain, palpitations, orthopnea and leg swelling.  Gastrointestinal:  Negative for abdominal pain, blood in stool, constipation, diarrhea, heartburn, melena and vomiting.  Genitourinary:  Negative for dysuria, frequency and urgency.  Musculoskeletal:  Positive for back pain and joint pain.  Skin: Negative.  Negative for itching and rash.  Neurological:  Negative for tingling, focal weakness and weakness.  Endo/Heme/Allergies:  Does not bruise/bleed easily.  Psychiatric/Behavioral:  Negative for depression. The patient is not nervous/anxious and does not have insomnia.      PAST MEDICAL HISTORY :  Past Medical History:  Diagnosis Date   Breast cancer ([place]) 1999   right breast ca   Erythrocytosis 09/30/2014   Hypertension    Lipoma     PAST SURGICAL HISTORY :   Past Surgical  History:  Procedure Laterality Date   MASTECTOMY Right 1999   rotator cuff      FAMILY HISTORY :   Family History  Problem Relation Age of Onset   Breast cancer Mother 52    SOCIAL HISTORY:   Social History   Tobacco Use   Smoking status: Never   Smokeless tobacco: Never  Substance Use Topics   Alcohol use: Yes    Alcohol/week: 1.0 standard drink of alcohol    Types: 1 Glasses of wine per week    Comment: BID prn    ALLERGIES:  is allergic to cephalexin.  MEDICATIONS:  Current Outpatient Medications  Medication Sig Dispense Refill   aspirin 81 MG EC tablet Take 81 mg by mouth daily.      Calcium Carbonate-Vitamin D 600-400 MG-UNIT per tablet Take 1 tablet by mouth 2 (two) times daily.      cloNIDine (CATAPRES) 0.1 MG tablet Take 1 tablet by mouth 2 (two) times daily.     Coenzyme Q10 (CO Q-10) 100 MG CAPS Take 100 mg by mouth every morning.      fluticasone (FLONASE) 50 MCG/ACT nasal spray Place 1 spray into both nostrils at bedtime.     hydrALAZINE (APRESOLINE) 50 MG tablet Take 1 tablet by mouth 3 (three) times daily.     lisinopril (PRINIVIL,ZESTRIL) 10 MG tablet Take 10 mg by mouth daily.     MAGNESIUM GLYCINATE PLUS PO Take 200 mg by mouth every evening.      Multiple Vitamin (MULTI-VITAMINS) TABS Take 1 tablet by mouth 2 (two) times daily.      Multiple Vitamins-Minerals (MACULAR HEALTH FORMULA PO) Take by mouth.  Omega-3 Fatty Acids (FISH OIL) 1000 MG CAPS Take 1,000 mg by mouth 2 (two) times daily.      propranolol ER (INDERAL LA) 80 MG 24 hr capsule Take 80 mg by mouth every morning.     riboflavin (VITAMIN B-2) 100 MG TABS tablet Take 100 mg by mouth daily.      TURMERIC PO Take 1 tablet by mouth 2 (two) times daily.     amLODipine (NORVASC) 2.5 MG tablet Take 1 tablet by mouth at bedtime.  (Patient not taking: Reported on 06/14/2022)     meclizine (ANTIVERT) 25 MG tablet meclizine 25 mg 2 pills every 8 hours x1 day; and then 1 pill every 8 hx 1 day;if improved  then 1 pill every 6-8 hours as needed for dizziness (Patient not taking: Reported on 01/27/2021) 60 tablet 0   No current facility-administered medications for this visit.    PHYSICAL EXAMINATION: ECOG PERFORMANCE STATUS: 0 - Asymptomatic  BP (!) 148/80 (BP Location: Left Arm, Patient Position: Sitting)   Pulse 60   Temp 97.7 F (36.5 C) (Tympanic)   Resp 16   Wt 145 lb 14.4 oz (66.2 kg)   BMI 26.69 kg/m   Filed Weights   06/14/22 1300  Weight: 145 lb 14.4 oz (66.2 kg)      Physical Exam HENT:     Head: Normocephalic and atraumatic.     Mouth/Throat:     Pharynx: No oropharyngeal exudate.  Eyes:     Pupils: Pupils are equal, round, and reactive to light.  Cardiovascular:     Rate and Rhythm: Normal rate and regular rhythm.  Pulmonary:     Effort: No respiratory distress.     Breath sounds: No wheezing.  Abdominal:     General: Bowel sounds are normal. There is no distension.     Palpations: Abdomen is soft. There is no mass.     Tenderness: There is no abdominal tenderness. There is no guarding or rebound.  Musculoskeletal:        General: No tenderness. Normal range of motion.     Cervical back: Normal range of motion and neck supple.  Skin:    General: Skin is warm.  Neurological:     Mental Status: She is alert and oriented to person, place, and time.  Psychiatric:        Mood and Affect: Affect normal.      LABORATORY DATA:  I have reviewed the data as listed    Component Value Date/Time   NA 136 02/22/2022 1412   NA 142 06/04/2014 1443   K 4.1 02/22/2022 1412   K 4.1 06/04/2014 1443   CL 102 02/22/2022 1412   CL 108 (H) 06/04/2014 1443   CO2 26 02/22/2022 1412   CO2 26 06/04/2014 1443   GLUCOSE 88 02/22/2022 1412   GLUCOSE 99 06/04/2014 1443   BUN 22 02/22/2022 1412   BUN 18 06/04/2014 1443   CREATININE 0.84 02/22/2022 1412   CREATININE 0.92 06/04/2014 1443   CALCIUM 9.1 02/22/2022 1412   CALCIUM 9.0 06/04/2014 1443   PROT 6.8 12/20/2015  1757   PROT 6.8 06/04/2014 1443   ALBUMIN 4.1 12/20/2015 1757   ALBUMIN 3.4 06/04/2014 1443   AST 28 12/20/2015 1757   AST 25 06/04/2014 1443   ALT 19 12/20/2015 1757   ALT 23 06/04/2014 1443   ALKPHOS 51 12/20/2015 1757   ALKPHOS 52 06/04/2014 1443   BILITOT 0.8 12/20/2015 1757   BILITOT 0.3 06/04/2014 1443  GFRNONAA >60 02/22/2022 1412   GFRNONAA >60 06/04/2014 1443   GFRNONAA 51 (L) 12/11/2013 1412   GFRAA >60 01/06/2020 1253   GFRAA >60 06/04/2014 1443   GFRAA 59 (L) 12/11/2013 1412    No results found for: "SPEP", "UPEP"  Lab Results  Component Value Date   WBC 8.4 02/22/2022   NEUTROABS 5.4 02/22/2022   HGB 15.7 (H) 06/14/2022   HCT 49.6 (H) 06/14/2022   MCV 88.0 02/22/2022   PLT 214 02/22/2022      Chemistry      Component Value Date/Time   NA 136 02/22/2022 1412   NA 142 06/04/2014 1443   K 4.1 02/22/2022 1412   K 4.1 06/04/2014 1443   CL 102 02/22/2022 1412   CL 108 (H) 06/04/2014 1443   CO2 26 02/22/2022 1412   CO2 26 06/04/2014 1443   BUN 22 02/22/2022 1412   BUN 18 06/04/2014 1443   CREATININE 0.84 02/22/2022 1412   CREATININE 0.92 06/04/2014 1443      Component Value Date/Time   CALCIUM 9.1 02/22/2022 1412   CALCIUM 9.0 06/04/2014 1443   ALKPHOS 51 12/20/2015 1757   ALKPHOS 52 06/04/2014 1443   AST 28 12/20/2015 1757   AST 25 06/04/2014 1443   ALT 19 12/20/2015 1757   ALT 23 06/04/2014 1443   BILITOT 0.8 12/20/2015 1757   BILITOT 0.3 06/04/2014 1443          ASSESSMENT & PLAN:   Erythrocytosis # ERYTHROCYTOSIS-  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  Today hematocrit is 47/[goal < 45].  # Proceed with phlebotomy today and every 8 W.   # Elevated blood pressure- ~160s; over stable-; defer to Dr. Doy Hutching.  # IV access: Poor IV access.  Pt declines; Hold off a port at this time.  If worse port could be considered.  # DISPOSITION: #  Phlebotomy today # 8 weeks as planned H&H- possible phlebotomy # 10 weeks M H&H- possible phlebotomy # 12 weeksH&H- possible phlebotomy # 14 weeks- MD; labs- cbc/bmp; possible phlebotomy- Dr.B  Cc; Dr.Lateef/Dr.Sparks    ;Cammie Sickle, MD 06/14/2022 2:10 PM

## 2022-06-14 NOTE — Progress Notes (Signed)
Patient denies new problems/concerns today.   

## 2022-06-14 NOTE — Assessment & Plan Note (Addendum)
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  Today hematocrit is 49/[goal < 45].  # Proceed with phlebotomy today and every 8 W.   # Elevated blood pressure- ~148/80s; over all stable-; defer to Dr. Doy Hutching.  # IV access: Poor IV access.  Pt declines; Hold off a port at this time.  If worse port could be considered.  Q8 weeks- appt [not 74m # DISPOSITION: # Phlebotomy today # 8 weeks as planned H&H- possible phlebotomy # 16 weeks M H&H- possible phlebotomy # 24 weeks- MD; labs- cbc/bmp; possible phlebotomy- Dr.B  Cc; Dr.Lateef/Dr.Sparks

## 2022-06-14 NOTE — Patient Instructions (Signed)

## 2022-07-13 IMAGING — MG DIGITAL SCREENING UNILAT LEFT W/ TOMO W/ CAD
4 series · 4 of 12 positions shown · non-contrast
Comparison: Previous exam(s).

CLINICAL DATA: Screening.

EXAM:
DIGITAL SCREENING UNILATERAL LEFT MAMMOGRAM WITH CAD AND TOMO

[L CC synth-2D]
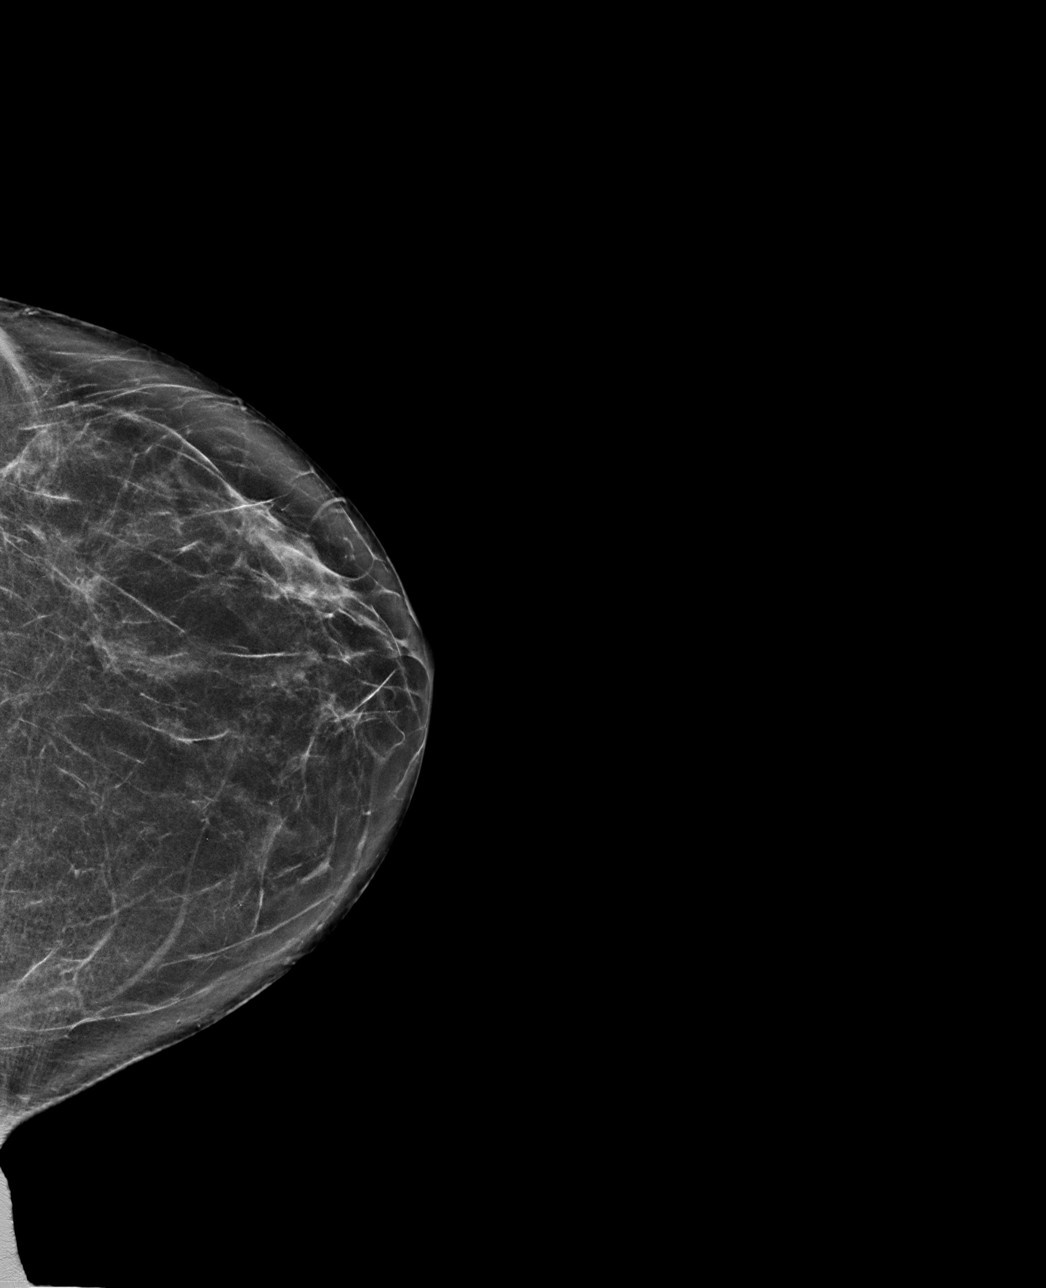

[L MLO synth-2D]
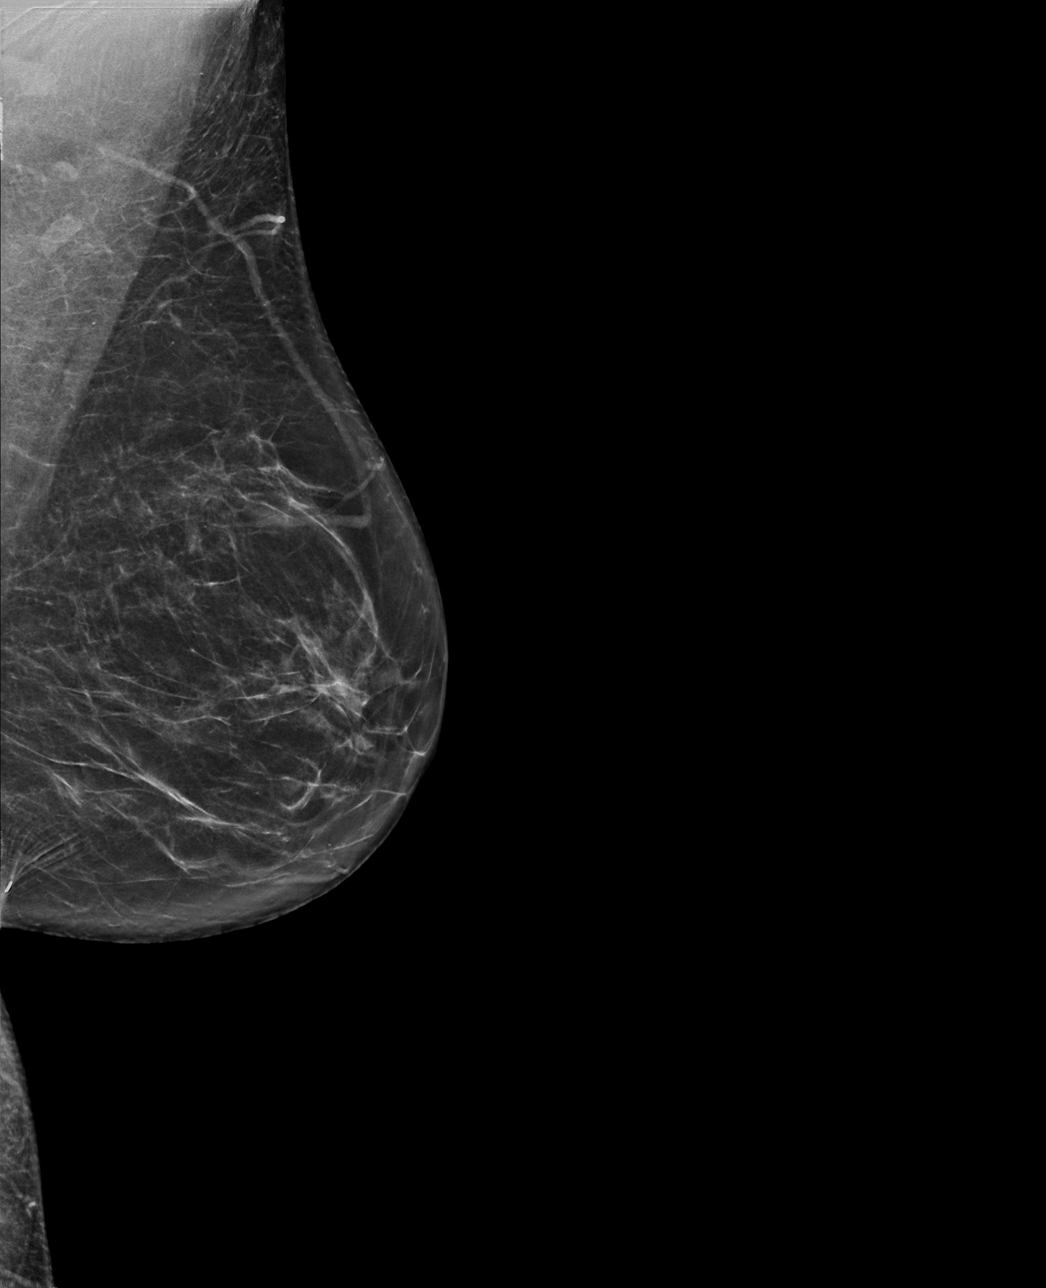

[L CC tomo · tomo slice 33/66.0]
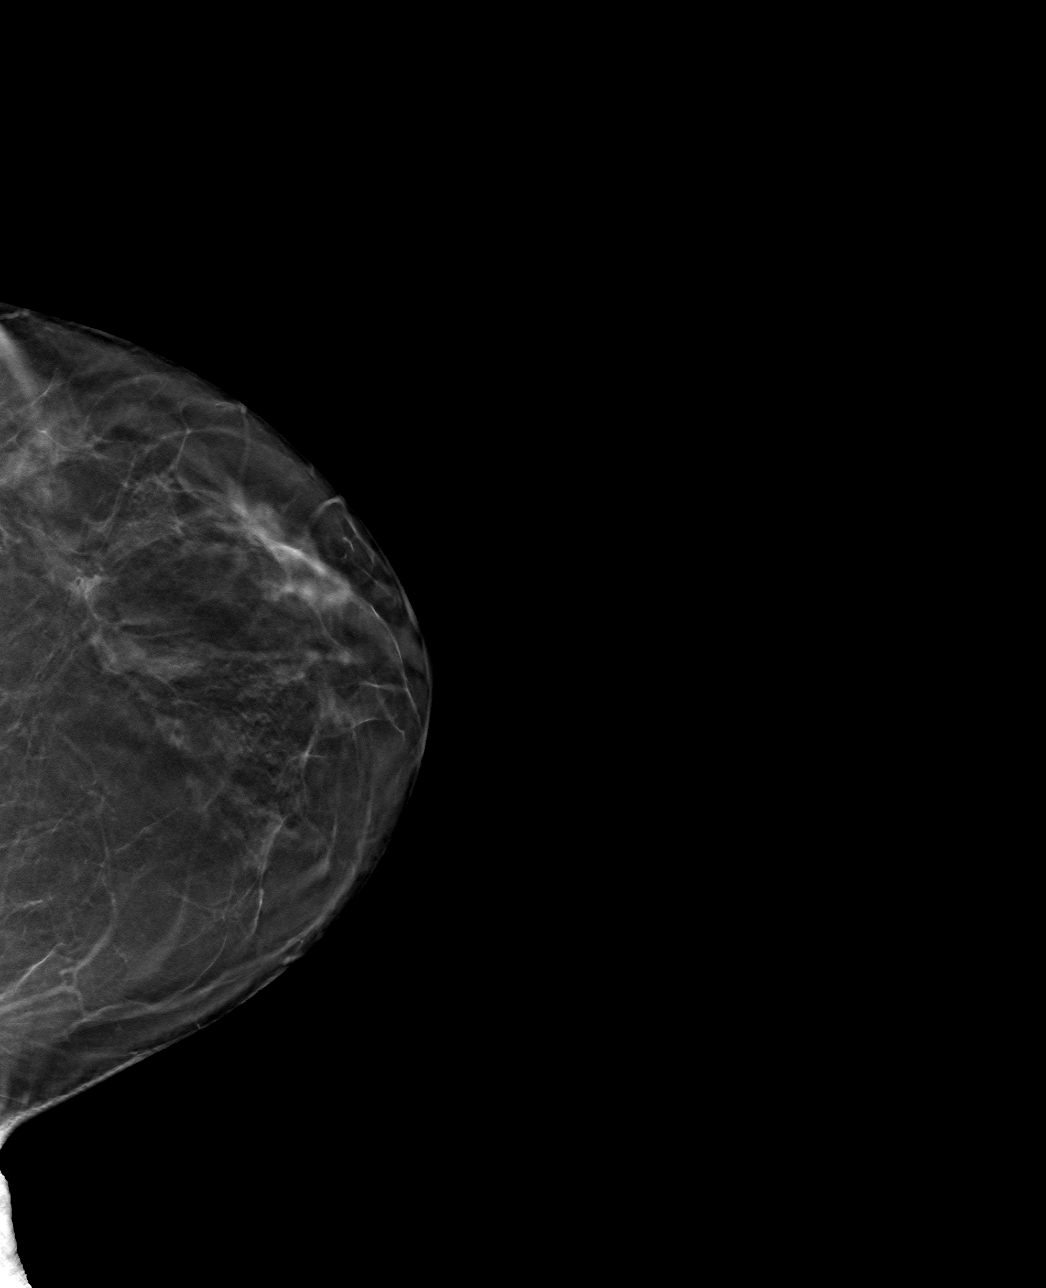

[L MLO tomo · tomo slice 35/69.0]
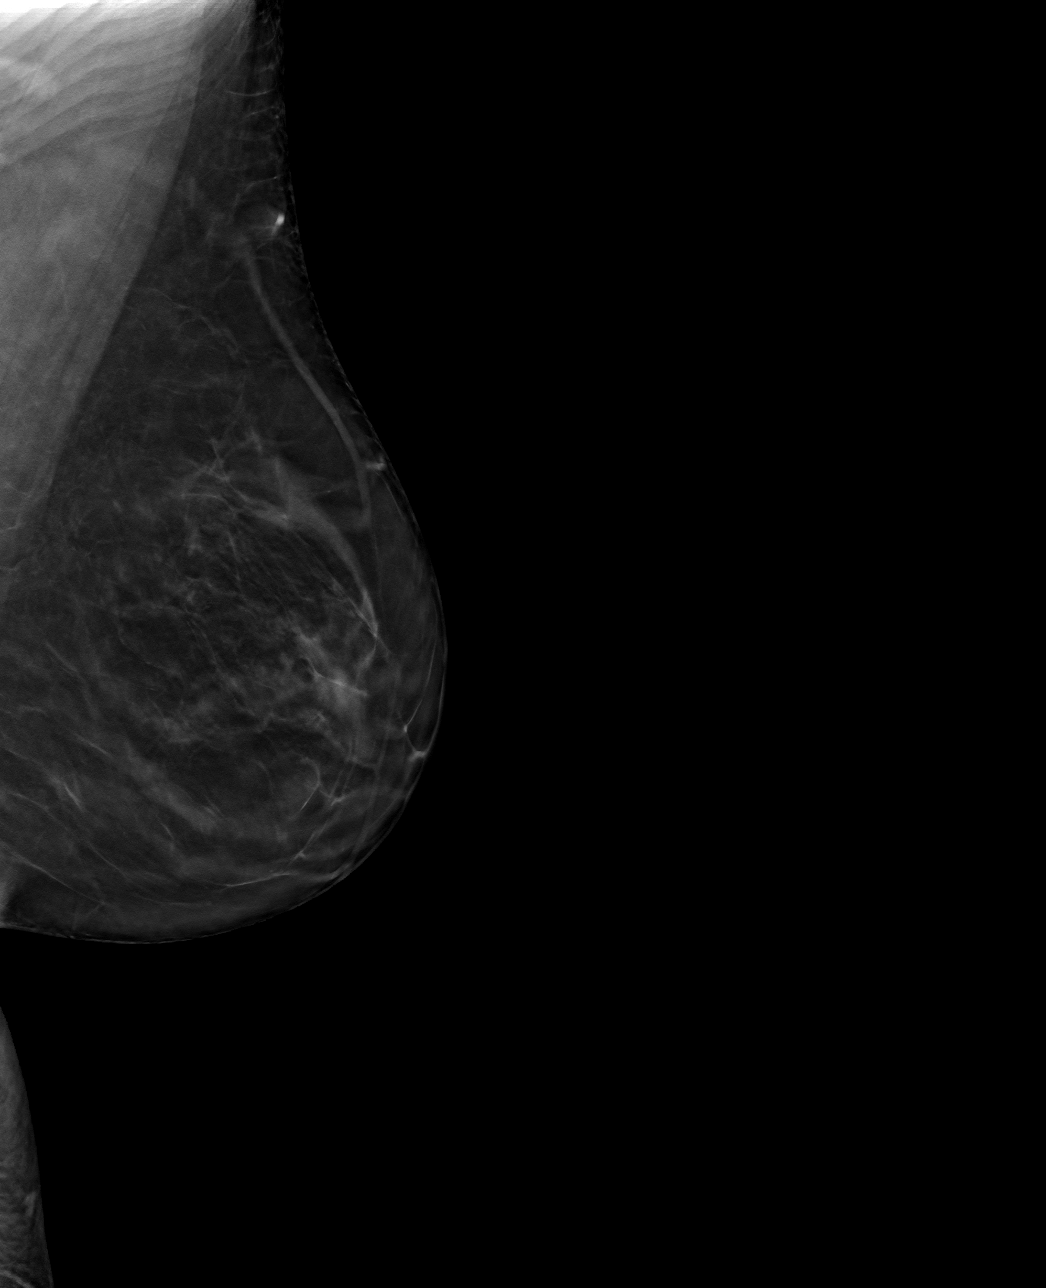

[4 of 12 positions shown; findings below may reference images not displayed]

ACR Breast Density Category b: There are scattered areas of
fibroglandular density.
FINDINGS: The patient has had a right mastectomy. There are no findings
suspicious for malignancy.

Images were processed with CAD.
IMPRESSION: No mammographic evidence of malignancy. A result letter of this
screening mammogram will be mailed directly to the patient.

RECOMMENDATION:
Screening mammogram in one year.  (Code:GY-Q-70Y)

BI-RADS CATEGORY  1: Negative.

## 2022-08-09 ENCOUNTER — Inpatient Hospital Stay: Payer: Medicare HMO

## 2022-08-09 ENCOUNTER — Inpatient Hospital Stay: Payer: Medicare HMO | Attending: Internal Medicine

## 2022-08-09 VITALS — BP 161/74 | HR 55 | Resp 20

## 2022-08-09 DIAGNOSIS — D751 Secondary polycythemia: Secondary | ICD-10-CM | POA: Insufficient documentation

## 2022-08-09 LAB — HEMOGLOBIN AND HEMATOCRIT, BLOOD
HCT: 47.7 % — ABNORMAL HIGH (ref 36.0–46.0)
Hemoglobin: 15.2 g/dL — ABNORMAL HIGH (ref 12.0–15.0)

## 2022-08-23 ENCOUNTER — Other Ambulatory Visit: Payer: Medicare HMO

## 2022-09-06 ENCOUNTER — Other Ambulatory Visit: Payer: Medicare HMO

## 2022-09-14 ENCOUNTER — Other Ambulatory Visit: Payer: Self-pay | Admitting: Internal Medicine

## 2022-09-14 DIAGNOSIS — Z1231 Encounter for screening mammogram for malignant neoplasm of breast: Secondary | ICD-10-CM

## 2022-09-20 ENCOUNTER — Other Ambulatory Visit: Payer: Medicare HMO

## 2022-09-20 ENCOUNTER — Ambulatory Visit: Payer: Medicare HMO | Admitting: Internal Medicine

## 2022-10-04 ENCOUNTER — Other Ambulatory Visit: Payer: Medicare HMO

## 2022-10-04 ENCOUNTER — Inpatient Hospital Stay: Payer: Medicare HMO | Attending: Internal Medicine

## 2022-10-04 ENCOUNTER — Inpatient Hospital Stay: Payer: Medicare HMO

## 2022-10-04 VITALS — BP 110/66 | HR 60 | Resp 20

## 2022-10-04 DIAGNOSIS — D751 Secondary polycythemia: Secondary | ICD-10-CM | POA: Diagnosis present

## 2022-10-04 LAB — HEMOGLOBIN AND HEMATOCRIT, BLOOD
HCT: 50.8 % — ABNORMAL HIGH (ref 36.0–46.0)
Hemoglobin: 16.4 g/dL — ABNORMAL HIGH (ref 12.0–15.0)

## 2022-11-29 ENCOUNTER — Inpatient Hospital Stay: Payer: Medicare HMO

## 2022-11-29 ENCOUNTER — Inpatient Hospital Stay: Payer: Medicare HMO | Attending: Internal Medicine

## 2022-11-29 ENCOUNTER — Encounter: Payer: Self-pay | Admitting: Internal Medicine

## 2022-11-29 ENCOUNTER — Inpatient Hospital Stay: Payer: Medicare HMO | Admitting: Internal Medicine

## 2022-11-29 VITALS — BP 124/74 | HR 56 | Resp 18

## 2022-11-29 VITALS — BP 145/68 | HR 61 | Temp 97.9°F | Resp 17 | Wt 143.2 lb

## 2022-11-29 DIAGNOSIS — Z79899 Other long term (current) drug therapy: Secondary | ICD-10-CM | POA: Insufficient documentation

## 2022-11-29 DIAGNOSIS — D751 Secondary polycythemia: Secondary | ICD-10-CM | POA: Insufficient documentation

## 2022-11-29 LAB — BASIC METABOLIC PANEL
Anion gap: 9 (ref 5–15)
BUN: 20 mg/dL (ref 8–23)
CO2: 24 mmol/L (ref 22–32)
Calcium: 9.2 mg/dL (ref 8.9–10.3)
Chloride: 105 mmol/L (ref 98–111)
Creatinine, Ser: 0.99 mg/dL (ref 0.44–1.00)
GFR, Estimated: 57 mL/min — ABNORMAL LOW (ref >60)
Glucose, Bld: 111 mg/dL — ABNORMAL HIGH (ref 70–99)
Potassium: 3.8 mmol/L (ref 3.5–5.1)
Sodium: 138 mmol/L (ref 135–145)

## 2022-11-29 LAB — CBC WITH DIFFERENTIAL/PLATELET
Abs Immature Granulocytes: 0.02 10*3/uL (ref 0.00–0.07)
Basophils Absolute: 0 10*3/uL (ref 0.0–0.1)
Basophils Relative: 1 %
Eosinophils Absolute: 0.1 10*3/uL (ref 0.0–0.5)
Eosinophils Relative: 1 %
HCT: 49.3 % — ABNORMAL HIGH (ref 36.0–46.0)
Hemoglobin: 15.9 g/dL — ABNORMAL HIGH (ref 12.0–15.0)
Immature Granulocytes: 0 %
Lymphocytes Relative: 24 %
Lymphs Abs: 1.6 10*3/uL (ref 0.7–4.0)
MCH: 29 pg (ref 26.0–34.0)
MCHC: 32.3 g/dL (ref 30.0–36.0)
MCV: 89.8 fL (ref 80.0–100.0)
Monocytes Absolute: 0.6 10*3/uL (ref 0.1–1.0)
Monocytes Relative: 9 %
Neutro Abs: 4.4 10*3/uL (ref 1.7–7.7)
Neutrophils Relative %: 65 %
Platelets: 211 10*3/uL (ref 150–400)
RBC: 5.49 MIL/uL — ABNORMAL HIGH (ref 3.87–5.11)
RDW: 13.6 % (ref 11.5–15.5)
WBC: 6.7 10*3/uL (ref 4.0–10.5)
nRBC: 0 % (ref 0.0–0.2)

## 2022-11-29 NOTE — Progress Notes (Signed)
Doing well; Here for possible phlebotomy.

## 2022-11-29 NOTE — Assessment & Plan Note (Signed)
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  Today hematocrit is 49/[goal < 45].  # Proceed with phlebotomy today and every 8 W.   # Elevated blood pressure- ~148/80s; over all stable-; defer to Dr. Doy Hutching.  # IV access: Poor IV access.  Pt declines; Hold off a port at this time.  If worse port could be considered.  Q8 weeks- appt [not 74m # DISPOSITION: # Phlebotomy today # 8 weeks as planned H&H- possible phlebotomy # 16 weeks M H&H- possible phlebotomy # 24 weeks- MD; labs- cbc/bmp; possible phlebotomy- Dr.B  Cc; Dr.Lateef/Dr.Sparks

## 2022-11-29 NOTE — Progress Notes (Signed)
[facility] OFFICE PROGRESS NOTE  Patient Care Team: Marguarite Arbour, MD as PCP - General (Internal Medicine) Earna Coder, MD as Medical Oncologist (Hematology and Oncology)   SUMMARY OF ONCOLOGIC HISTORY:  # April 2015- ERTHROCYTOSIS ; JAK-2/Exon- 12NEG; Epo-N; June 2017-  Phlebotomy every 8 weeks  # 1999-RIGHT BREAST- Mastec & ALND [[place]] & chemo; Tam x 5 years   INTERVAL HISTORY: Alone.  Ambulating independently.  A very pleasant 82year-old female patient with above history of symptomatic erythrocytosis [headaches elevated blood pressure] is here for follow-up.   Patient denies new problems/concerns today. Denies any worsening headaches.  Denies any any nausea vomiting.  States her blood pressures are better controlled.   Review of Systems  Constitutional:  Positive for malaise/fatigue. Negative for chills, diaphoresis, fever and weight loss.  HENT:  Negative for nosebleeds and sore throat.   Eyes:  Negative for double vision.  Respiratory:  Negative for cough, hemoptysis, sputum production, shortness of breath and wheezing.   Cardiovascular:  Negative for chest pain, palpitations, orthopnea and leg swelling.  Gastrointestinal:  Negative for abdominal pain, blood in stool, constipation, diarrhea, heartburn, melena and vomiting.  Genitourinary:  Negative for dysuria, frequency and urgency.  Musculoskeletal:  Positive for back pain and joint pain.  Skin: Negative.  Negative for itching and rash.  Neurological:  Negative for tingling, focal weakness and weakness.  Endo/Heme/Allergies:  Does not bruise/bleed easily.  Psychiatric/Behavioral:  Negative for depression. The patient is not nervous/anxious and does not have insomnia.      PAST MEDICAL HISTORY :  Past Medical History:  Diagnosis Date   Breast cancer (HCC) 1999   right breast ca   Erythrocytosis 09/30/2014   Hypertension    Lipoma     PAST SURGICAL HISTORY :   Past Surgical  History:  Procedure Laterality Date   MASTECTOMY Right 1999   rotator cuff      FAMILY HISTORY :   Family History  Problem Relation Age of Onset   Breast cancer Mother 66    SOCIAL HISTORY:   Social History   Tobacco Use   Smoking status: Never   Smokeless tobacco: Never  Substance Use Topics   Alcohol use: Yes    Alcohol/week: 1.0 standard drink of alcohol    Types: 1 Glasses of wine per week    Comment: BID prn    ALLERGIES:  is allergic to cephalexin.  MEDICATIONS:  Current Outpatient Medications  Medication Sig Dispense Refill   aspirin 81 MG EC tablet Take 81 mg by mouth daily.      Calcium Carbonate-Vitamin D 600-400 MG-UNIT per tablet Take 1 tablet by mouth 2 (two) times daily.      cloNIDine (CATAPRES) 0.1 MG tablet Take 1 tablet by mouth 2 (two) times daily.     Coenzyme Q10 (CO Q-10) 100 MG CAPS Take 100 mg by mouth every morning.      hydrALAZINE (APRESOLINE) 50 MG tablet Take 1 tablet by mouth 3 (three) times daily.     lisinopril (PRINIVIL,ZESTRIL) 10 MG tablet Take 20 mg by mouth daily.     MAGNESIUM GLYCINATE PLUS PO Take 200 mg by mouth every evening.      Multiple Vitamin (MULTI-VITAMINS) TABS Take 1 tablet by mouth 2 (two) times daily.      Multiple Vitamins-Minerals (MACULAR HEALTH FORMULA PO) Take by mouth.     Omega-3 Fatty Acids (FISH OIL) 1000 MG CAPS Take 1,000 mg by mouth 2 (two) times daily.  propranolol ER (INDERAL LA) 80 MG 24 hr capsule Take 80 mg by mouth every morning.     TURMERIC PO Take 1 tablet by mouth 2 (two) times daily.     No current facility-administered medications for this visit.    PHYSICAL EXAMINATION: ECOG PERFORMANCE STATUS: 0 - Asymptomatic  BP (!) 145/68 (BP Location: Left Arm, Patient Position: Sitting)   Pulse 61   Temp 97.9 F (36.6 C) (Tympanic)   Resp 17   Wt 143 lb 3.2 oz (65 kg)   SpO2 97%   BMI 26.19 kg/m   Filed Weights   11/29/22 1328  Weight: 143 lb 3.2 oz (65 kg)   Physical Exam HENT:      Head: Normocephalic and atraumatic.     Mouth/Throat:     Pharynx: No oropharyngeal exudate.  Eyes:     Pupils: Pupils are equal, round, and reactive to light.  Cardiovascular:     Rate and Rhythm: Normal rate and regular rhythm.  Pulmonary:     Effort: No respiratory distress.     Breath sounds: No wheezing.  Abdominal:     General: Bowel sounds are normal. There is no distension.     Palpations: Abdomen is soft. There is no mass.     Tenderness: There is no abdominal tenderness. There is no guarding or rebound.  Musculoskeletal:        General: No tenderness. Normal range of motion.     Cervical back: Normal range of motion and neck supple.  Skin:    General: Skin is warm.  Neurological:     Mental Status: She is alert and oriented to person, place, and time.  Psychiatric:        Mood and Affect: Affect normal.      LABORATORY DATA:  I have reviewed the data as listed    Component Value Date/Time   NA 138 11/29/2022 1258   NA 142 06/04/2014 1443   K 3.8 11/29/2022 1258   K 4.1 06/04/2014 1443   CL 105 11/29/2022 1258   CL 108 (H) 06/04/2014 1443   CO2 24 11/29/2022 1258   CO2 26 06/04/2014 1443   GLUCOSE 111 (H) 11/29/2022 1258   GLUCOSE 99 06/04/2014 1443   BUN 20 11/29/2022 1258   BUN 18 06/04/2014 1443   CREATININE 0.99 11/29/2022 1258   CREATININE 0.92 06/04/2014 1443   CALCIUM 9.2 11/29/2022 1258   CALCIUM 9.0 06/04/2014 1443   PROT 6.8 12/20/2015 1757   PROT 6.8 06/04/2014 1443   ALBUMIN 4.1 12/20/2015 1757   ALBUMIN 3.4 06/04/2014 1443   AST 28 12/20/2015 1757   AST 25 06/04/2014 1443   ALT 19 12/20/2015 1757   ALT 23 06/04/2014 1443   ALKPHOS 51 12/20/2015 1757   ALKPHOS 52 06/04/2014 1443   BILITOT 0.8 12/20/2015 1757   BILITOT 0.3 06/04/2014 1443   GFRNONAA 57 (L) 11/29/2022 1258   GFRNONAA >60 06/04/2014 1443   GFRNONAA 51 (L) 12/11/2013 1412   GFRAA >60 01/06/2020 1253   GFRAA >60 06/04/2014 1443   GFRAA 59 (L) 12/11/2013 1412    No  results found for: "SPEP", "UPEP"  Lab Results  Component Value Date   WBC 6.7 11/29/2022   NEUTROABS 4.4 11/29/2022   HGB 15.9 (H) 11/29/2022   HCT 49.3 (H) 11/29/2022   MCV 89.8 11/29/2022   PLT 211 11/29/2022      Chemistry      Component Value Date/Time   NA 138 11/29/2022 1258  NA 142 06/04/2014 1443   K 3.8 11/29/2022 1258   K 4.1 06/04/2014 1443   CL 105 11/29/2022 1258   CL 108 (H) 06/04/2014 1443   CO2 24 11/29/2022 1258   CO2 26 06/04/2014 1443   BUN 20 11/29/2022 1258   BUN 18 06/04/2014 1443   CREATININE 0.99 11/29/2022 1258   CREATININE 0.92 06/04/2014 1443      Component Value Date/Time   CALCIUM 9.2 11/29/2022 1258   CALCIUM 9.0 06/04/2014 1443   ALKPHOS 51 12/20/2015 1757   ALKPHOS 52 06/04/2014 1443   AST 28 12/20/2015 1757   AST 25 06/04/2014 1443   ALT 19 12/20/2015 1757   ALT 23 06/04/2014 1443   BILITOT 0.8 12/20/2015 1757   BILITOT 0.3 06/04/2014 1443          ASSESSMENT & PLAN:   Erythrocytosis # ERYTHROCYTOSIS-  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  Today hematocrit is 49/[goal < 45].  # Proceed with phlebotomy today and every 8 W.   # Elevated blood pressure- ~148/80s; over all stable-; defer to Dr. Judithann Sheen.  # IV access: Poor IV access.  Pt declines; Hold off a port at this time.  If worse port could be considered.  Q8 weeks- appt [not 19m] # DISPOSITION: # Phlebotomy today # 8 weeks as planned H&H- possible phlebotomy # 16 weeks M H&H- possible phlebotomy # 24 weeks- MD; labs- cbc/bmp; possible phlebotomy- Dr.B  Cc; Dr.Lateef/Dr.Sparks    ;Earna Coder, MD 11/29/2022 2:11 PM

## 2022-12-28 ENCOUNTER — Ambulatory Visit
Admission: RE | Admit: 2022-12-28 | Discharge: 2022-12-28 | Disposition: A | Discharge status: Home / Self Care | Payer: Medicare HMO | Source: Ambulatory Visit | Attending: Internal Medicine | Admitting: Internal Medicine

## 2022-12-28 DIAGNOSIS — Z1231 Encounter for screening mammogram for malignant neoplasm of breast: Secondary | ICD-10-CM | POA: Diagnosis present

## 2023-01-24 ENCOUNTER — Inpatient Hospital Stay: Payer: Medicare HMO | Attending: Internal Medicine

## 2023-01-24 ENCOUNTER — Inpatient Hospital Stay: Payer: Medicare HMO

## 2023-01-24 VITALS — BP 148/75 | HR 59 | Temp 97.8°F | Resp 20

## 2023-01-24 DIAGNOSIS — D751 Secondary polycythemia: Secondary | ICD-10-CM

## 2023-01-24 LAB — HEMOGLOBIN AND HEMATOCRIT (CANCER CENTER ONLY)
HCT: 49.1 % — ABNORMAL HIGH (ref 36.0–46.0)
Hemoglobin: 15.7 g/dL — ABNORMAL HIGH (ref 12.0–15.0)

## 2023-03-21 ENCOUNTER — Inpatient Hospital Stay: Payer: Medicare HMO | Attending: Internal Medicine

## 2023-03-21 ENCOUNTER — Inpatient Hospital Stay: Payer: Medicare HMO

## 2023-03-21 VITALS — BP 148/59 | HR 54 | Temp 97.3°F | Resp 20

## 2023-03-21 DIAGNOSIS — D751 Secondary polycythemia: Secondary | ICD-10-CM

## 2023-03-21 LAB — HEMOGLOBIN AND HEMATOCRIT (CANCER CENTER ONLY)
HCT: 46.5 % — ABNORMAL HIGH (ref 36.0–46.0)
Hemoglobin: 15.2 g/dL — ABNORMAL HIGH (ref 12.0–15.0)

## 2023-03-21 NOTE — Patient Instructions (Signed)

## 2023-05-16 ENCOUNTER — Encounter: Payer: Self-pay | Admitting: Internal Medicine

## 2023-05-16 ENCOUNTER — Inpatient Hospital Stay: Payer: Medicare HMO | Attending: Internal Medicine

## 2023-05-16 ENCOUNTER — Inpatient Hospital Stay: Payer: Medicare HMO

## 2023-05-16 ENCOUNTER — Inpatient Hospital Stay (HOSPITAL_BASED_OUTPATIENT_CLINIC_OR_DEPARTMENT_OTHER): Payer: Medicare HMO | Admitting: Internal Medicine

## 2023-05-16 VITALS — BP 157/66 | HR 56

## 2023-05-16 VITALS — BP 143/73 | HR 73 | Temp 97.9°F | Ht 62.0 in | Wt 145.0 lb

## 2023-05-16 DIAGNOSIS — Z79899 Other long term (current) drug therapy: Secondary | ICD-10-CM | POA: Diagnosis not present

## 2023-05-16 DIAGNOSIS — D751 Secondary polycythemia: Secondary | ICD-10-CM

## 2023-05-16 LAB — CBC WITH DIFFERENTIAL (CANCER CENTER ONLY)
Abs Immature Granulocytes: 0.03 10*3/uL (ref 0.00–0.07)
Basophils Absolute: 0 10*3/uL (ref 0.0–0.1)
Basophils Relative: 0 %
Eosinophils Absolute: 0.1 10*3/uL (ref 0.0–0.5)
Eosinophils Relative: 1 %
HCT: 49.3 % — ABNORMAL HIGH (ref 36.0–46.0)
Hemoglobin: 16.1 g/dL — ABNORMAL HIGH (ref 12.0–15.0)
Immature Granulocytes: 0 %
Lymphocytes Relative: 16 %
Lymphs Abs: 1.3 10*3/uL (ref 0.7–4.0)
MCH: 29.1 pg (ref 26.0–34.0)
MCHC: 32.7 g/dL (ref 30.0–36.0)
MCV: 89.2 fL (ref 80.0–100.0)
Monocytes Absolute: 0.6 10*3/uL (ref 0.1–1.0)
Monocytes Relative: 8 %
Neutro Abs: 6 10*3/uL (ref 1.7–7.7)
Neutrophils Relative %: 75 %
Platelet Count: 186 10*3/uL (ref 150–400)
RBC: 5.53 MIL/uL — ABNORMAL HIGH (ref 3.87–5.11)
RDW: 13.5 % (ref 11.5–15.5)
WBC Count: 8.1 10*3/uL (ref 4.0–10.5)
nRBC: 0 % (ref 0.0–0.2)

## 2023-05-16 LAB — BASIC METABOLIC PANEL - CANCER CENTER ONLY
Anion gap: 11 (ref 5–15)
BUN: 19 mg/dL (ref 8–23)
CO2: 22 mmol/L (ref 22–32)
Calcium: 9.1 mg/dL (ref 8.9–10.3)
Chloride: 105 mmol/L (ref 98–111)
Creatinine: 1.04 mg/dL — ABNORMAL HIGH (ref 0.44–1.00)
GFR, Estimated: 54 mL/min — ABNORMAL LOW (ref >60)
Glucose, Bld: 121 mg/dL — ABNORMAL HIGH (ref 70–99)
Potassium: 4.1 mmol/L (ref 3.5–5.1)
Sodium: 138 mmol/L (ref 135–145)

## 2023-05-16 NOTE — Assessment & Plan Note (Addendum)
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  Today hematocrit is 49/[goal < 45]. Stable.   # Proceed with phlebotomy today and every 8 W.   # Elevated blood pressure- ~148/80s; over all stable-; defer to Dr. Auston.  # IV access: Poor IV access.  Pt declines; Hold off a port at this time.  If worse port could be considered.  Q8 weeks- appt [not 102m]  # DISPOSITION: # Phlebotomy today # 8 weeks as planned H&H- possible phlebotomy # 16 weeks M H&H- possible phlebotomy # 24 weeks- MD; labs- cbc/bmp; possible phlebotomy- Dr.B  Cc; Dr.Lateef/Dr.Sparks

## 2023-05-16 NOTE — Progress Notes (Signed)
 [facility] OFFICE PROGRESS NOTE  Patient Care Team: Auston Reyes BIRCH, MD as PCP - General (Internal Medicine) Rennie Cindy SAUNDERS, MD as Medical Oncologist (Hematology and Oncology)   SUMMARY OF ONCOLOGIC HISTORY:  # April 2015- ERTHROCYTOSIS ; JAK-2/Exon- 12NEG; Epo-N; June 2017-  Phlebotomy every 8 weeks  # 1999-RIGHT BREAST- Mastec & ALND [[place]] & chemo; Tam x 5 years   INTERVAL HISTORY: Alone.  Ambulating independently.  A very pleasant 83 year-old female patient with above history of symptomatic erythrocytosis [headaches elevated blood pressure] is here for follow-up.   Patient denies new problems/concerns today. Denies any worsening headaches.  Denies any any nausea vomiting.  States her blood pressures are better controlled.   Review of Systems  Constitutional:  Positive for malaise/fatigue. Negative for chills, diaphoresis, fever and weight loss.  HENT:  Negative for nosebleeds and sore throat.   Eyes:  Negative for double vision.  Respiratory:  Negative for cough, hemoptysis, sputum production, shortness of breath and wheezing.   Cardiovascular:  Negative for chest pain, palpitations, orthopnea and leg swelling.  Gastrointestinal:  Negative for abdominal pain, blood in stool, constipation, diarrhea, heartburn, melena and vomiting.  Genitourinary:  Negative for dysuria, frequency and urgency.  Musculoskeletal:  Positive for back pain and joint pain.  Skin: Negative.  Negative for itching and rash.  Neurological:  Negative for tingling, focal weakness and weakness.  Endo/Heme/Allergies:  Does not bruise/bleed easily.  Psychiatric/Behavioral:  Negative for depression. The patient is not nervous/anxious and does not have insomnia.      PAST MEDICAL HISTORY :  Past Medical History:  Diagnosis Date   Breast cancer (HCC) 1999   right breast ca   Erythrocytosis 09/30/2014   Hypertension    Lipoma     PAST SURGICAL HISTORY :   Past Surgical  History:  Procedure Laterality Date   MASTECTOMY Right 1999   rotator cuff      FAMILY HISTORY :   Family History  Problem Relation Age of Onset   Breast cancer Mother 40    SOCIAL HISTORY:   Social History   Tobacco Use   Smoking status: Never   Smokeless tobacco: Never  Substance Use Topics   Alcohol use: Yes    Alcohol/week: 1.0 standard drink of alcohol    Types: 1 Glasses of wine per week    Comment: BID prn    ALLERGIES:  is allergic to cephalexin.  MEDICATIONS:  Current Outpatient Medications  Medication Sig Dispense Refill   aspirin  81 MG EC tablet Take 81 mg by mouth daily.      Calcium  Carbonate-Vitamin D  600-400 MG-UNIT per tablet Take 1 tablet by mouth 2 (two) times daily.      cloNIDine  (CATAPRES ) 0.1 MG tablet Take 1 tablet by mouth 2 (two) times daily.     Coenzyme Q10 (CO Q-10) 100 MG CAPS Take 100 mg by mouth every morning.      hydrALAZINE  (APRESOLINE ) 50 MG tablet Take 1 tablet by mouth 3 (three) times daily.     lisinopril  (PRINIVIL ,ZESTRIL ) 10 MG tablet Take 20 mg by mouth daily.     MAGNESIUM GLYCINATE PLUS PO Take 200 mg by mouth every evening.      Multiple Vitamin (MULTI-VITAMINS) TABS Take 1 tablet by mouth 2 (two) times daily.      Multiple Vitamins-Minerals (MACULAR HEALTH FORMULA PO) Take by mouth.     Omega-3 Fatty Acids (FISH OIL) 1000 MG CAPS Take 1,000 mg by mouth 2 (two) times  daily.      propranolol  ER (INDERAL  LA) 80 MG 24 hr capsule Take 80 mg by mouth every morning.     TURMERIC PO Take 1 tablet by mouth 2 (two) times daily.     No current facility-administered medications for this visit.    PHYSICAL EXAMINATION: ECOG PERFORMANCE STATUS: 0 - Asymptomatic  BP (!) 143/73 (BP Location: Left Arm, Patient Position: Sitting, Cuff Size: Normal)   Pulse 73   Temp 97.9 F (36.6 C) (Tympanic)   Ht 5' 2 (1.575 m)   Wt 145 lb (65.8 kg)   SpO2 98%   BMI 26.52 kg/m   Filed Weights   05/16/23 1250  Weight: 145 lb (65.8 kg)     Physical Exam HENT:     Head: Normocephalic and atraumatic.     Mouth/Throat:     Pharynx: No oropharyngeal exudate.  Eyes:     Pupils: Pupils are equal, round, and reactive to light.  Cardiovascular:     Rate and Rhythm: Normal rate and regular rhythm.  Pulmonary:     Effort: No respiratory distress.     Breath sounds: No wheezing.  Abdominal:     General: Bowel sounds are normal. There is no distension.     Palpations: Abdomen is soft. There is no mass.     Tenderness: There is no abdominal tenderness. There is no guarding or rebound.  Musculoskeletal:        General: No tenderness. Normal range of motion.     Cervical back: Normal range of motion and neck supple.  Skin:    General: Skin is warm.  Neurological:     Mental Status: She is alert and oriented to person, place, and time.  Psychiatric:        Mood and Affect: Affect normal.      LABORATORY DATA:  I have reviewed the data as listed    Component Value Date/Time   NA 138 05/16/2023 1239   NA 142 06/04/2014 1443   K 4.1 05/16/2023 1239   K 4.1 06/04/2014 1443   CL 105 05/16/2023 1239   CL 108 (H) 06/04/2014 1443   CO2 22 05/16/2023 1239   CO2 26 06/04/2014 1443   GLUCOSE 121 (H) 05/16/2023 1239   GLUCOSE 99 06/04/2014 1443   BUN 19 05/16/2023 1239   BUN 18 06/04/2014 1443   CREATININE 1.04 (H) 05/16/2023 1239   CREATININE 0.92 06/04/2014 1443   CALCIUM  9.1 05/16/2023 1239   CALCIUM  9.0 06/04/2014 1443   PROT 6.8 12/20/2015 1757   PROT 6.8 06/04/2014 1443   ALBUMIN 4.1 12/20/2015 1757   ALBUMIN 3.4 06/04/2014 1443   AST 28 12/20/2015 1757   AST 25 06/04/2014 1443   ALT 19 12/20/2015 1757   ALT 23 06/04/2014 1443   ALKPHOS 51 12/20/2015 1757   ALKPHOS 52 06/04/2014 1443   BILITOT 0.8 12/20/2015 1757   BILITOT 0.3 06/04/2014 1443   GFRNONAA 54 (L) 05/16/2023 1239   GFRNONAA >60 06/04/2014 1443   GFRNONAA 51 (L) 12/11/2013 1412   GFRAA >60 01/06/2020 1253   GFRAA >60 06/04/2014 1443   GFRAA  59 (L) 12/11/2013 1412    No results found for: SPEP, UPEP  Lab Results  Component Value Date   WBC 8.1 05/16/2023   NEUTROABS 6.0 05/16/2023   HGB 16.1 (H) 05/16/2023   HCT 49.3 (H) 05/16/2023   MCV 89.2 05/16/2023   PLT 186 05/16/2023      Chemistry  Component Value Date/Time   NA 138 05/16/2023 1239   NA 142 06/04/2014 1443   K 4.1 05/16/2023 1239   K 4.1 06/04/2014 1443   CL 105 05/16/2023 1239   CL 108 (H) 06/04/2014 1443   CO2 22 05/16/2023 1239   CO2 26 06/04/2014 1443   BUN 19 05/16/2023 1239   BUN 18 06/04/2014 1443   CREATININE 1.04 (H) 05/16/2023 1239   CREATININE 0.92 06/04/2014 1443      Component Value Date/Time   CALCIUM  9.1 05/16/2023 1239   CALCIUM  9.0 06/04/2014 1443   ALKPHOS 51 12/20/2015 1757   ALKPHOS 52 06/04/2014 1443   AST 28 12/20/2015 1757   AST 25 06/04/2014 1443   ALT 19 12/20/2015 1757   ALT 23 06/04/2014 1443   BILITOT 0.8 12/20/2015 1757   BILITOT 0.3 06/04/2014 1443          ASSESSMENT & PLAN:   Erythrocytosis # ERYTHROCYTOSIS-  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  Today hematocrit is 49/[goal < 45]. Stable.   # Proceed with phlebotomy today and every 8 W.   # Elevated blood pressure- ~148/80s; over all stable-; defer to Dr. Auston.  # IV access: Poor IV access.  Pt declines; Hold off a port at this time.  If worse port could be considered.  Q8 weeks- appt [not 11m]  # DISPOSITION: # Phlebotomy today # 8 weeks as planned H&H- possible phlebotomy # 16 weeks M H&H- possible phlebotomy # 24 weeks- MD; labs- cbc/bmp; possible phlebotomy- Dr.B  Cc; Dr.Lateef/Dr.Sparks    ;Cindy JONELLE Joe, MD 05/16/2023 1:33 PM

## 2023-05-16 NOTE — Progress Notes (Signed)
 Has a Covid injection 02/08/23 and RSV 03/23/23.

## 2023-07-11 ENCOUNTER — Inpatient Hospital Stay: Payer: Medicare HMO

## 2023-07-11 ENCOUNTER — Inpatient Hospital Stay: Payer: Medicare HMO | Attending: Internal Medicine

## 2023-07-11 VITALS — BP 142/65 | HR 59

## 2023-07-11 DIAGNOSIS — D751 Secondary polycythemia: Secondary | ICD-10-CM | POA: Diagnosis present

## 2023-07-11 LAB — HEMOGLOBIN AND HEMATOCRIT (CANCER CENTER ONLY)
HCT: 47.4 % — ABNORMAL HIGH (ref 36.0–46.0)
Hemoglobin: 15.4 g/dL — ABNORMAL HIGH (ref 12.0–15.0)

## 2023-08-28 ENCOUNTER — Other Ambulatory Visit: Payer: Self-pay

## 2023-08-28 ENCOUNTER — Emergency Department

## 2023-08-28 ENCOUNTER — Emergency Department
Admission: EM | Admit: 2023-08-28 | Discharge: 2023-08-28 | Disposition: A | Discharge status: Home / Self Care | Attending: Emergency Medicine | Admitting: Emergency Medicine

## 2023-08-28 DIAGNOSIS — I10 Essential (primary) hypertension: Secondary | ICD-10-CM | POA: Diagnosis not present

## 2023-08-28 DIAGNOSIS — R519 Headache, unspecified: Secondary | ICD-10-CM | POA: Diagnosis present

## 2023-08-28 LAB — CBC
HCT: 48.5 % — ABNORMAL HIGH (ref 36.0–46.0)
Hemoglobin: 16 g/dL — ABNORMAL HIGH (ref 12.0–15.0)
MCH: 29 pg (ref 26.0–34.0)
MCHC: 33 g/dL (ref 30.0–36.0)
MCV: 87.9 fL (ref 80.0–100.0)
Platelets: 216 10*3/uL (ref 150–400)
RBC: 5.52 MIL/uL — ABNORMAL HIGH (ref 3.87–5.11)
RDW: 13.5 % (ref 11.5–15.5)
WBC: 10 10*3/uL (ref 4.0–10.5)
nRBC: 0 % (ref 0.0–0.2)

## 2023-08-28 LAB — COMPREHENSIVE METABOLIC PANEL WITH GFR
ALT: 18 U/L (ref 0–44)
AST: 20 U/L (ref 15–41)
Albumin: 4.1 g/dL (ref 3.5–5.0)
Alkaline Phosphatase: 48 U/L (ref 38–126)
Anion gap: 10 (ref 5–15)
BUN: 24 mg/dL — ABNORMAL HIGH (ref 8–23)
CO2: 23 mmol/L (ref 22–32)
Calcium: 9.5 mg/dL (ref 8.9–10.3)
Chloride: 106 mmol/L (ref 98–111)
Creatinine, Ser: 0.75 mg/dL (ref 0.44–1.00)
GFR, Estimated: >60 mL/min (ref >60)
Glucose, Bld: 131 mg/dL — ABNORMAL HIGH (ref 70–99)
Potassium: 3.7 mmol/L (ref 3.5–5.1)
Sodium: 139 mmol/L (ref 135–145)
Total Bilirubin: 0.8 mg/dL (ref 0.0–1.2)
Total Protein: 7.1 g/dL (ref 6.5–8.1)

## 2023-08-28 LAB — URINALYSIS, COMPLETE (UACMP) WITH MICROSCOPIC
Bacteria, UA: NONE SEEN
Bilirubin Urine: NEGATIVE
Glucose, UA: NEGATIVE mg/dL
Ketones, ur: NEGATIVE mg/dL
Nitrite: NEGATIVE
Protein, ur: NEGATIVE mg/dL
Specific Gravity, Urine: 1.005 (ref 1.005–1.030)
Squamous Epithelial / HPF: 0 /HPF (ref 0–5)
pH: 7 (ref 5.0–8.0)

## 2023-08-28 LAB — TROPONIN I (HIGH SENSITIVITY): Troponin I (High Sensitivity): 9 ng/L (ref <18)

## 2023-08-28 LAB — RESP PANEL BY RT-PCR (RSV, FLU A&B, COVID)  RVPGX2
Influenza A by PCR: NEGATIVE
Influenza B by PCR: NEGATIVE
Resp Syncytial Virus by PCR: NEGATIVE
SARS Coronavirus 2 by RT PCR: NEGATIVE

## 2023-08-28 MED ORDER — CLONIDINE HCL 0.1 MG PO TABS
0.1000 mg | ORAL_TABLET | Freq: Once | ORAL | Status: AC
  Administered 2023-08-28: 0.1 mg via ORAL
  Filled 2023-08-28: qty 1

## 2023-08-28 NOTE — ED Triage Notes (Signed)
 Pt arrives via EMS from home - neighbor called 911 due to pt "not responding normally" - AMS - per EMS pt is able to MAE, NAD, clear speech - answering questions appropriately however seems to be repeating herself and having short term memory difficulty - repeating questions and cannot recall what was just said to her. SBP hypertensive - pt complains of headache 5/10.

## 2023-08-28 NOTE — ED Notes (Signed)
 Pt ambulatory with steady and even gait.

## 2023-08-28 NOTE — ED Provider Notes (Signed)
 [facility] Provider Note    Event Date/Time   First MD Initiated Contact with Patient 08/28/23 1940     (approximate)  History   Chief Complaint: Altered Mental Status and Headache  HPI  Michele Andrade is a 83 y.o. female with a past medical history of hypertension, presents to the emergency department with concerns for high blood pressure and feeling "discombobulated."  According to the patient she states she has been under significant amount of stress today as she had to get a presentation at her living facility at [facility].  Patient states she gave the presentation and felt really well about it however afterwards she states she felt very tired and states she did not feel right.  She tried to rest to see if this would feel better however she continued to feel off so she called her friend who is a nurse who came over and checked her blood pressure and it was 190 and they recommended that she go to the emergency department.  Patient denies any weakness or numbness.  Patient states mild head pressure today.  Patient states she has had issues with her blood pressure in the past where she will have symptoms of head pressure and feeling unwell.  She states this typically resolves when she takes her blood pressure medication and her blood pressure comes down.  Patient states she is taken all of her blood pressure medication as prescribed today, but continued to have symptoms that she came to the emergency department.  Patient is prescribed clonidine  0.1 mg twice daily she is prescribed hydralazine  50 mg 3 times daily in addition to propranolol  80 mg ER and lisinopril  20 mg every morning.  Patient denies any chest pain or shortness of breath.  Denies any recent illnesses.  Physical Exam   Triage Vital Signs: ED Triage Vitals [08/28/23 1921]  Encounter Vitals Group     BP (!) 196/97     Systolic BP Percentile      Diastolic BP Percentile      Pulse Rate 66     Resp 14      Temp 98.9 F (37.2 C)     Temp Source Oral     SpO2 97 %     Weight 156 lb 8.4 oz (71 kg)     Height 5\' 2"  (1.575 m)     Head Circumference      Peak Flow      Pain Score 5     Pain Loc      Pain Education      Exclude from Growth Chart     Most recent vital signs: Vitals:   08/28/23 1921  BP: (!) 196/97  Pulse: 66  Resp: 14  Temp: 98.9 F (37.2 C)  SpO2: 97%    General: Awake, no distress.  CV:  Good peripheral perfusion.  Regular rate and rhythm  Resp:  Normal effort.  Equal breath sounds bilaterally.  Abd:  No distention.  Soft, nontender.  No rebound or guarding. Other:  Patient has great grip strength bilaterally 5/5 motor in all extremities.  No cranial nerve deficits appreciated.  Reassuring neurological exam.   ED Results / Procedures / Treatments   EKG  EKG viewed and interpreted by myself shows a normal sinus rhythm at 62 bpm with a slightly widened QRS, normal axis, largely normal intervals and nonspecific ST changes.  RADIOLOGY  I have reviewed and interpreted the CT scan images of the head.  No  bleed or significant abnormality seen on my evaluation.   MEDICATIONS ORDERED IN ED: Medications - No data to display   IMPRESSION / MDM / ASSESSMENT AND PLAN / ED COURSE  I reviewed the triage vital signs and the nursing notes.  Patient's presentation is most consistent with acute presentation with potential threat to life or bodily function.  Patient presents to the emergency department for high blood pressure head pressure and feeling "discombobulated."  Patient states she has had this feeling multiple times in the past when her blood pressure goes up.  Will prescribe an extra dose of clonidine  for the patient.  Will check labs a CT scan of the head and continue to closely monitor.  Patient agreeable to plan of care.  Patient's lab work overall reassuring with a normal urinalysis, reassuring chemistry with normal renal function.  Reassuring CBC.  I have  reviewed the CT scan of the head with no concerning findings on my evaluation awaiting radiology read.  I have added on a troponin and an EKG as a precaution.  Will reassess after remaining labs have resulted and the patient has received her clonidine .  Patient's workup is overall reassuring CBC is normal, chemistry is normal, urinalysis shows no concerning findings.  Troponin is negative and respiratory panel negative.  I reviewed the CT scan head images I do not appreciate any significant abnormality awaiting radiology read.  Anticipate likely discharge home.  Patient's blood pressure is improving currently 168 systolic.  FINAL CLINICAL IMPRESSION(S) / ED DIAGNOSES   Hypertension   Note:  This document was prepared using Dragon voice recognition software and may include unintentional dictation errors.   Ruth Cove, MD 08/28/23 2306

## 2023-08-28 NOTE — ED Triage Notes (Signed)
 On arrival to the ED - pt is alert and oriented - answering questions appropriately. Able to explain the course of her day.

## 2023-08-28 NOTE — Discharge Instructions (Addendum)
 As we discussed you may take an additional 0.1 mg of clonidine  if needed for significant hypertension (top number over 180 or bottom number over 100, up to 1 time daily).  Please follow-up with your doctor for further evaluation.  Your workup in the emergency department has shown reassuring results.

## 2023-09-05 ENCOUNTER — Inpatient Hospital Stay: Payer: Medicare HMO

## 2023-09-05 ENCOUNTER — Inpatient Hospital Stay: Payer: Medicare HMO | Attending: Internal Medicine

## 2023-09-05 VITALS — BP 154/73 | HR 58 | Resp 18

## 2023-09-05 DIAGNOSIS — D751 Secondary polycythemia: Secondary | ICD-10-CM | POA: Diagnosis present

## 2023-09-05 LAB — HEMOGLOBIN AND HEMATOCRIT (CANCER CENTER ONLY)
HCT: 49.2 % — ABNORMAL HIGH (ref 36.0–46.0)
Hemoglobin: 15.4 g/dL — ABNORMAL HIGH (ref 12.0–15.0)

## 2023-10-09 ENCOUNTER — Other Ambulatory Visit: Payer: Self-pay | Admitting: Internal Medicine

## 2023-10-09 DIAGNOSIS — Z1231 Encounter for screening mammogram for malignant neoplasm of breast: Secondary | ICD-10-CM

## 2023-10-31 ENCOUNTER — Inpatient Hospital Stay: Payer: Medicare HMO

## 2023-10-31 ENCOUNTER — Inpatient Hospital Stay: Payer: Medicare HMO | Admitting: Internal Medicine

## 2023-10-31 ENCOUNTER — Encounter: Payer: Self-pay | Admitting: Internal Medicine

## 2023-10-31 ENCOUNTER — Inpatient Hospital Stay: Payer: Medicare HMO | Attending: Internal Medicine

## 2023-10-31 ENCOUNTER — Other Ambulatory Visit: Payer: Self-pay | Admitting: Neurology

## 2023-10-31 DIAGNOSIS — R6889 Other general symptoms and signs: Secondary | ICD-10-CM

## 2023-10-31 DIAGNOSIS — D751 Secondary polycythemia: Secondary | ICD-10-CM | POA: Insufficient documentation

## 2023-10-31 DIAGNOSIS — R253 Fasciculation: Secondary | ICD-10-CM

## 2023-10-31 LAB — CBC WITH DIFFERENTIAL (CANCER CENTER ONLY)
Abs Immature Granulocytes: 0.03 10*3/uL (ref 0.00–0.07)
Basophils Absolute: 0 10*3/uL (ref 0.0–0.1)
Basophils Relative: 1 %
Eosinophils Absolute: 0.1 10*3/uL (ref 0.0–0.5)
Eosinophils Relative: 1 %
HCT: 48.1 % — ABNORMAL HIGH (ref 36.0–46.0)
Hemoglobin: 15.8 g/dL — ABNORMAL HIGH (ref 12.0–15.0)
Immature Granulocytes: 0 %
Lymphocytes Relative: 20 %
Lymphs Abs: 1.6 10*3/uL (ref 0.7–4.0)
MCH: 28.7 pg (ref 26.0–34.0)
MCHC: 32.8 g/dL (ref 30.0–36.0)
MCV: 87.3 fL (ref 80.0–100.0)
Monocytes Absolute: 0.7 10*3/uL (ref 0.1–1.0)
Monocytes Relative: 9 %
Neutro Abs: 5.5 10*3/uL (ref 1.7–7.7)
Neutrophils Relative %: 69 %
Platelet Count: 210 10*3/uL (ref 150–400)
RBC: 5.51 MIL/uL — ABNORMAL HIGH (ref 3.87–5.11)
RDW: 14.3 % (ref 11.5–15.5)
WBC Count: 7.9 10*3/uL (ref 4.0–10.5)
nRBC: 0 % (ref 0.0–0.2)

## 2023-10-31 LAB — BASIC METABOLIC PANEL WITH GFR
Anion gap: 9 (ref 5–15)
BUN: 20 mg/dL (ref 8–23)
CO2: 22 mmol/L (ref 22–32)
Calcium: 9.3 mg/dL (ref 8.9–10.3)
Chloride: 106 mmol/L (ref 98–111)
Creatinine, Ser: 0.88 mg/dL (ref 0.44–1.00)
GFR, Estimated: >60 mL/min (ref >60)
Glucose, Bld: 114 mg/dL — ABNORMAL HIGH (ref 70–99)
Potassium: 3.9 mmol/L (ref 3.5–5.1)
Sodium: 137 mmol/L (ref 135–145)

## 2023-10-31 NOTE — Progress Notes (Signed)
 Michele Andrade presents today for phlebotomy per MD orders. Phlebotomy procedure started at 1455 and ended at 1525. 300 mls removed. Patient tolerated procedure well. IV needle removed intact.

## 2023-10-31 NOTE — Patient Instructions (Signed)

## 2023-10-31 NOTE — Progress Notes (Unsigned)
 Fatigue:  YES Headaches:  NO Dizziness or lightheadedness: NO Shortness of breath:  NO Redness of the skin: NO Joint pain:  NO Numbness or tingling:  YES-FACE Other symptoms:  itching, night sweats, and abdominal discomfort: NO  Saw Neurology Allyson Stallion at [facility] for facial paresthesia.

## 2023-11-04 ENCOUNTER — Ambulatory Visit

## 2023-11-20 ENCOUNTER — Other Ambulatory Visit: Payer: Self-pay | Admitting: Neurology

## 2023-11-20 DIAGNOSIS — R202 Paresthesia of skin: Secondary | ICD-10-CM

## 2023-11-24 ENCOUNTER — Telehealth: Payer: Self-pay | Admitting: Internal Medicine

## 2023-11-24 NOTE — Telephone Encounter (Signed)
 Pt stated that per her doctor at [facility] clinic. Dr.Sparks, told her to see Dr.B first before she gets a lab drawn. Dr.Sparks wants pt to communicate with Dr.B what Dr.Sparks and pt talked about before getting blood drawn because then it might change what labs pt gets.  Please advise if this is okay to switch the lab/MD appt order or not. Thank you

## 2023-12-06 ENCOUNTER — Telehealth: Payer: Self-pay | Admitting: Internal Medicine

## 2023-12-06 NOTE — Telephone Encounter (Signed)
 Returned phone call re: poor IV access with phlebotomy. Pt re-scheduled- appt on 8/1.  GB

## 2023-12-08 ENCOUNTER — Telehealth: Payer: Self-pay | Admitting: Internal Medicine

## 2023-12-08 ENCOUNTER — Inpatient Hospital Stay: Attending: Internal Medicine | Admitting: Internal Medicine

## 2023-12-08 ENCOUNTER — Encounter: Payer: Self-pay | Admitting: Internal Medicine

## 2023-12-08 VITALS — BP 140/72 | HR 60 | Temp 97.3°F | Resp 19 | Ht 62.0 in | Wt 141.2 lb

## 2023-12-08 DIAGNOSIS — I10 Essential (primary) hypertension: Secondary | ICD-10-CM | POA: Diagnosis not present

## 2023-12-08 DIAGNOSIS — Z9011 Acquired absence of right breast and nipple: Secondary | ICD-10-CM | POA: Insufficient documentation

## 2023-12-08 DIAGNOSIS — D751 Secondary polycythemia: Secondary | ICD-10-CM | POA: Diagnosis present

## 2023-12-08 DIAGNOSIS — Z79899 Other long term (current) drug therapy: Secondary | ICD-10-CM | POA: Diagnosis not present

## 2023-12-08 DIAGNOSIS — Z853 Personal history of malignant neoplasm of breast: Secondary | ICD-10-CM | POA: Insufficient documentation

## 2023-12-08 NOTE — Progress Notes (Signed)
 Patient states she has a list of things to go over with Dr. B and that she needs to share some info from her visit with Dr. Auston. Overall, she's doing okay.

## 2023-12-08 NOTE — Addendum Note (Signed)
 Addended by: LAEL BROWNING A on: 12/08/2023 10:00 AM   Modules accepted: Orders

## 2023-12-08 NOTE — Telephone Encounter (Signed)
 Per chat:  # DISPOSITION: # cancel aug appt- # end of sep, H&H- possible phlebotomy; end of dec -MD; H&Hpossible phlebotomy- D.rB   Appointment scheduled and patient notified

## 2023-12-08 NOTE — Assessment & Plan Note (Addendum)
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   # However given stability of blood pressures the difficulty of IV access- I think it reasonable to stretch the goal to  < 50]; and then every 3 months;  Proceed with phlebotomy- as needed-   # Elevated blood pressure- ~148/80s; over all stable-; defer to Dr. Auston.  # IV access: Poor IV access.  Pt declines; Hold off a port at this time.  If worse port could be considered.  Q 3 M   # DISPOSITION: # cancel aug appt- # end of sep, H&H- possible phlebotomy- Dr.B  Cc; Dr.Lateef/Dr.Sparks

## 2023-12-08 NOTE — Progress Notes (Signed)
 [facility] OFFICE PROGRESS NOTE  Patient Care Team: Michele Reyes BIRCH, MD as PCP - General (Internal Medicine) Rennie Cindy SAUNDERS, MD as Medical Oncologist (Hematology and Oncology)   SUMMARY OF ONCOLOGIC HISTORY:  # April 2015- ERTHROCYTOSIS ; JAK-2/Exon- 12NEG; Epo-N; June 2017-  Phlebotomy every 8 weeks  # 1999-RIGHT BREAST- Mastec & ALND [[place]] & chemo; Tam x 5 years   INTERVAL HISTORY: Alone.  Ambulating independently.  A very pleasant 83  year-old female patient with above history of symptomatic erythrocytosis [headaches elevated blood pressure] is here for follow-up.   Patient noted to have concerns with IV access.  She admits her blood pressure is under control.  Denies any worsening headaches.    No nausea no vomiting.  Denies any weight loss or night sweats.  Review of Systems  Constitutional:  Positive for malaise/fatigue. Negative for chills, diaphoresis, fever and weight loss.  HENT:  Negative for nosebleeds and sore throat.   Eyes:  Negative for double vision.  Respiratory:  Negative for cough, hemoptysis, sputum production, shortness of breath and wheezing.   Cardiovascular:  Negative for chest pain, palpitations, orthopnea and leg swelling.  Gastrointestinal:  Negative for abdominal pain, blood in stool, constipation, diarrhea, heartburn, melena and vomiting.  Genitourinary:  Negative for dysuria, frequency and urgency.  Musculoskeletal:  Positive for back pain and joint pain.  Skin: Negative.  Negative for itching and rash.  Neurological:  Negative for tingling, focal weakness and weakness.  Endo/Heme/Allergies:  Does not bruise/bleed easily.  Psychiatric/Behavioral:  Negative for depression. The patient is not nervous/anxious and does not have insomnia.      PAST MEDICAL HISTORY :  Past Medical History:  Diagnosis Date   Breast cancer (HCC) 1999   right breast ca   Erythrocytosis 09/30/2014   Hypertension    Lipoma     PAST  SURGICAL HISTORY :   Past Surgical History:  Procedure Laterality Date   MASTECTOMY Right 1999   rotator cuff      FAMILY HISTORY :   Family History  Problem Relation Age of Onset   Breast cancer Mother 37    SOCIAL HISTORY:   Social History   Tobacco Use   Smoking status: Never   Smokeless tobacco: Never  Substance Use Topics   Alcohol use: Yes    Alcohol/week: 1.0 standard drink of alcohol    Types: 1 Glasses of wine per week    Comment: BID prn    ALLERGIES:  is allergic to gabapentin and cephalexin.  MEDICATIONS:  Current Outpatient Medications  Medication Sig Dispense Refill   ALPHA LIPOIC ACID PO Take 600 mg by mouth daily.     aspirin  81 MG EC tablet Take 81 mg by mouth daily.      Calcium  Carbonate-Vitamin D  600-400 MG-UNIT per tablet Take 1 tablet by mouth 2 (two) times daily.      cloNIDine  (CATAPRES ) 0.1 MG tablet Take 1 tablet by mouth 2 (two) times daily.     Coenzyme Q10 (CO Q-10) 100 MG CAPS Take 100 mg by mouth every morning.      hydrALAZINE  (APRESOLINE ) 50 MG tablet Take 1 tablet by mouth 3 (three) times daily.     lisinopril  (PRINIVIL ,ZESTRIL ) 10 MG tablet Take 20 mg by mouth daily.     MAGNESIUM GLYCINATE PLUS PO Take 200 mg by mouth every evening.      Multiple Vitamin (MULTI-VITAMINS) TABS Take 1 tablet by mouth 2 (two) times daily.  Multiple Vitamins-Minerals (MACULAR HEALTH FORMULA PO) Take by mouth.     Omega-3 Fatty Acids (FISH OIL) 1000 MG CAPS Take 1,000 mg by mouth 2 (two) times daily.      propranolol  ER (INDERAL  LA) 80 MG 24 hr capsule Take 80 mg by mouth every morning.     TURMERIC PO Take 1 tablet by mouth 2 (two) times daily.     No current facility-administered medications for this visit.    PHYSICAL EXAMINATION: ECOG PERFORMANCE STATUS: 0 - Asymptomatic  BP (!) 140/72 (BP Location: Left Arm, Patient Position: Sitting)   Pulse 60   Temp (!) 97.3 F (36.3 C) (Tympanic)   Resp 19   Ht 5' 2 (1.575 m)   Wt 141 lb 3.2 oz (64  kg)   SpO2 98%   BMI 25.83 kg/m   Filed Weights   12/08/23 0912  Weight: 141 lb 3.2 oz (64 kg)    Physical Exam HENT:     Head: Normocephalic and atraumatic.     Mouth/Throat:     Pharynx: No oropharyngeal exudate.  Eyes:     Pupils: Pupils are equal, round, and reactive to light.  Cardiovascular:     Rate and Rhythm: Normal rate and regular rhythm.  Pulmonary:     Effort: No respiratory distress.     Breath sounds: No wheezing.  Abdominal:     General: Bowel sounds are normal. There is no distension.     Palpations: Abdomen is soft. There is no mass.     Tenderness: There is no abdominal tenderness. There is no guarding or rebound.  Musculoskeletal:        General: No tenderness. Normal range of motion.     Cervical back: Normal range of motion and neck supple.  Skin:    General: Skin is warm.  Neurological:     Mental Status: She is alert and oriented to person, place, and time.  Psychiatric:        Mood and Affect: Affect normal.      LABORATORY DATA:  I have reviewed the data as listed    Component Value Date/Time   NA 137 10/31/2023 1331   NA 142 06/04/2014 1443   K 3.9 10/31/2023 1331   K 4.1 06/04/2014 1443   CL 106 10/31/2023 1331   CL 108 (H) 06/04/2014 1443   CO2 22 10/31/2023 1331   CO2 26 06/04/2014 1443   GLUCOSE 114 (H) 10/31/2023 1331   GLUCOSE 99 06/04/2014 1443   BUN 20 10/31/2023 1331   BUN 18 06/04/2014 1443   CREATININE 0.88 10/31/2023 1331   CREATININE 1.04 (H) 05/16/2023 1239   CREATININE 0.92 06/04/2014 1443   CALCIUM  9.3 10/31/2023 1331   CALCIUM  9.0 06/04/2014 1443   PROT 7.1 08/28/2023 1925   PROT 6.8 06/04/2014 1443   ALBUMIN 4.1 08/28/2023 1925   ALBUMIN 3.4 06/04/2014 1443   AST 20 08/28/2023 1925   AST 25 06/04/2014 1443   ALT 18 08/28/2023 1925   ALT 23 06/04/2014 1443   ALKPHOS 48 08/28/2023 1925   ALKPHOS 52 06/04/2014 1443   BILITOT 0.8 08/28/2023 1925   BILITOT 0.3 06/04/2014 1443   GFRNONAA >60 10/31/2023 1331    GFRNONAA 54 (L) 05/16/2023 1239   GFRNONAA >60 06/04/2014 1443   GFRNONAA 51 (L) 12/11/2013 1412   GFRAA >60 01/06/2020 1253   GFRAA >60 06/04/2014 1443   GFRAA 59 (L) 12/11/2013 1412    No results found for: SPEP, UPEP  Lab  Results  Component Value Date   WBC 7.9 10/31/2023   NEUTROABS 5.5 10/31/2023   HGB 15.8 (H) 10/31/2023   HCT 48.1 (H) 10/31/2023   MCV 87.3 10/31/2023   PLT 210 10/31/2023      Chemistry      Component Value Date/Time   NA 137 10/31/2023 1331   NA 142 06/04/2014 1443   K 3.9 10/31/2023 1331   K 4.1 06/04/2014 1443   CL 106 10/31/2023 1331   CL 108 (H) 06/04/2014 1443   CO2 22 10/31/2023 1331   CO2 26 06/04/2014 1443   BUN 20 10/31/2023 1331   BUN 18 06/04/2014 1443   CREATININE 0.88 10/31/2023 1331   CREATININE 1.04 (H) 05/16/2023 1239   CREATININE 0.92 06/04/2014 1443      Component Value Date/Time   CALCIUM  9.3 10/31/2023 1331   CALCIUM  9.0 06/04/2014 1443   ALKPHOS 48 08/28/2023 1925   ALKPHOS 52 06/04/2014 1443   AST 20 08/28/2023 1925   AST 25 06/04/2014 1443   ALT 18 08/28/2023 1925   ALT 23 06/04/2014 1443   BILITOT 0.8 08/28/2023 1925   BILITOT 0.3 06/04/2014 1443          ASSESSMENT & PLAN:   Erythrocytosis # 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   # However given stability of blood pressures the difficulty of IV access- I think it reasonable to stretch the goal to  < 50]; and then every 3 months;  Proceed with phlebotomy- as needed-   # Elevated blood pressure- ~148/80s; over all stable-; defer to Dr. Auston.  # IV access: Poor IV access.  Pt declines; Hold off a port at this time.  If worse port could be considered.  Q 3 M   # DISPOSITION: # cancel aug appt- # end of sep, H&H- possible phlebotomy- Dr.B  Cc; Dr.Lateef/Dr.Sparks    ;Cindy JONELLE Joe, MD 12/08/2023 9:53 AM

## 2023-12-15 ENCOUNTER — Encounter: Payer: Self-pay | Admitting: Neurology

## 2023-12-22 ENCOUNTER — Ambulatory Visit
Admission: RE | Admit: 2023-12-22 | Discharge: 2023-12-22 | Disposition: A | Discharge status: Home / Self Care | Source: Ambulatory Visit | Attending: Neurology | Admitting: Neurology

## 2023-12-22 ENCOUNTER — Encounter: Payer: Self-pay | Admitting: Internal Medicine

## 2023-12-22 DIAGNOSIS — R6889 Other general symptoms and signs: Secondary | ICD-10-CM

## 2023-12-22 DIAGNOSIS — R253 Fasciculation: Secondary | ICD-10-CM

## 2023-12-29 ENCOUNTER — Ambulatory Visit
Admission: RE | Admit: 2023-12-29 | Discharge: 2023-12-29 | Disposition: A | Discharge status: Home / Self Care | Source: Ambulatory Visit | Attending: Internal Medicine | Admitting: Internal Medicine

## 2023-12-29 DIAGNOSIS — Z1231 Encounter for screening mammogram for malignant neoplasm of breast: Secondary | ICD-10-CM | POA: Diagnosis present

## 2024-01-03 ENCOUNTER — Ambulatory Visit: Admitting: Internal Medicine

## 2024-01-03 ENCOUNTER — Other Ambulatory Visit

## 2024-01-03 ENCOUNTER — Encounter

## 2024-02-04 IMAGING — US US EXTREM LOW*R* LIMITED
1 series · 15 of 22 positions shown · non-contrast
Comparison: No prior.

CLINICAL DATA: Right thigh mass.

EXAM:
ULTRASOUND RIGHT LOWER EXTREMITY LIMITED
TECHNIQUE: Ultrasound examination of the lower extremity soft tissues was
performed in the area of clinical concern.

[Series 1: us venous imag bi/left/(id) · portal-venous · 22 acquisitions, 15 frames shown]
[im 1/22]
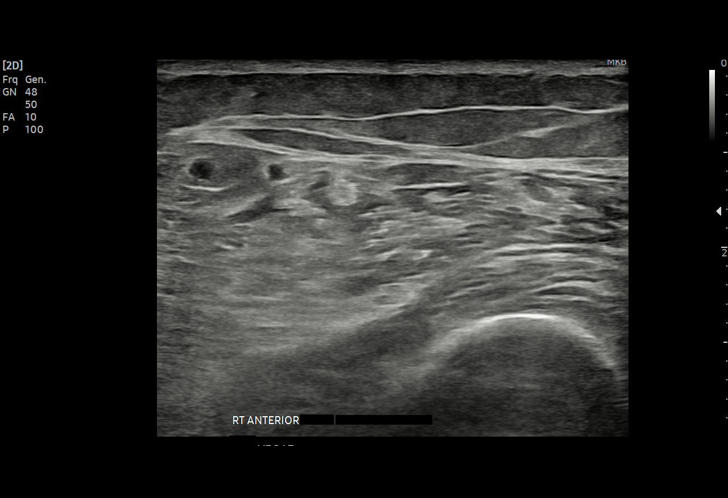
[im 3/22]
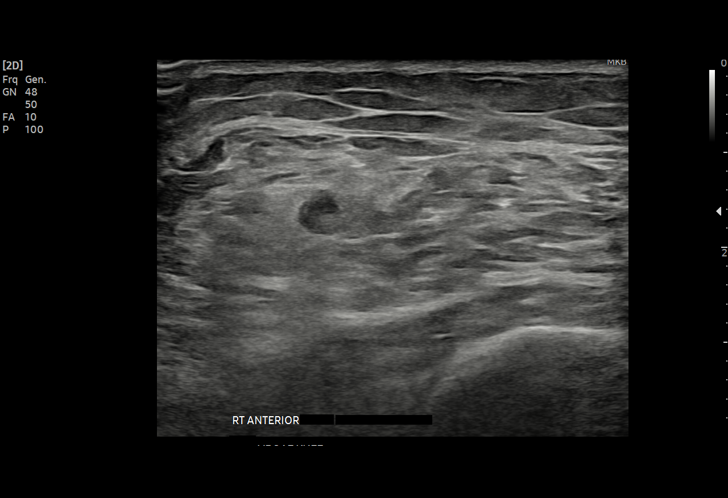
[im 4/22]
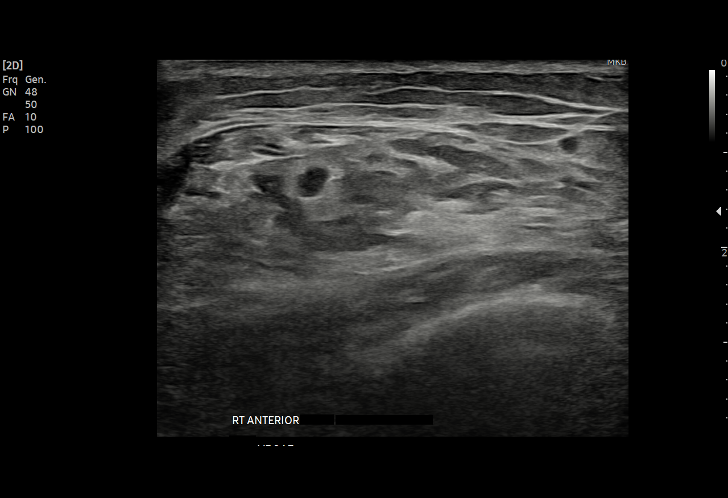
[im 6/22]
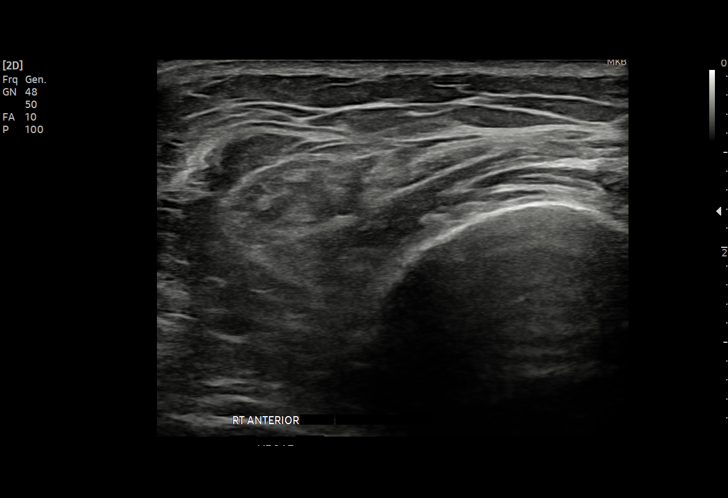
[im 7/22]
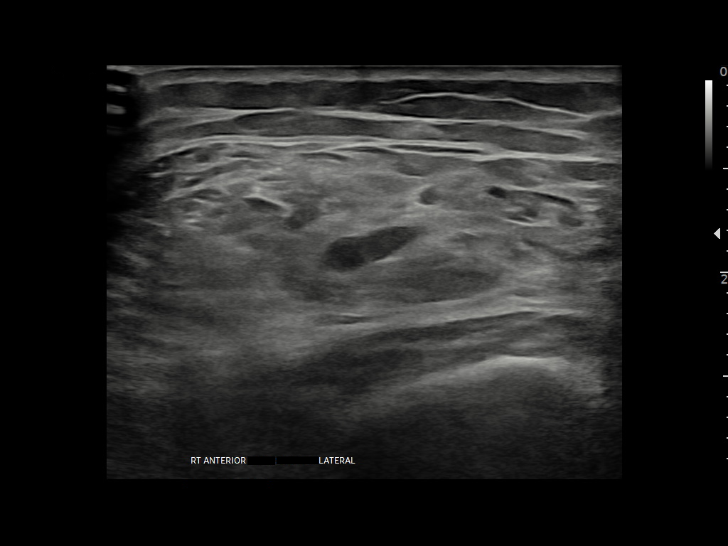
[im 9/22]
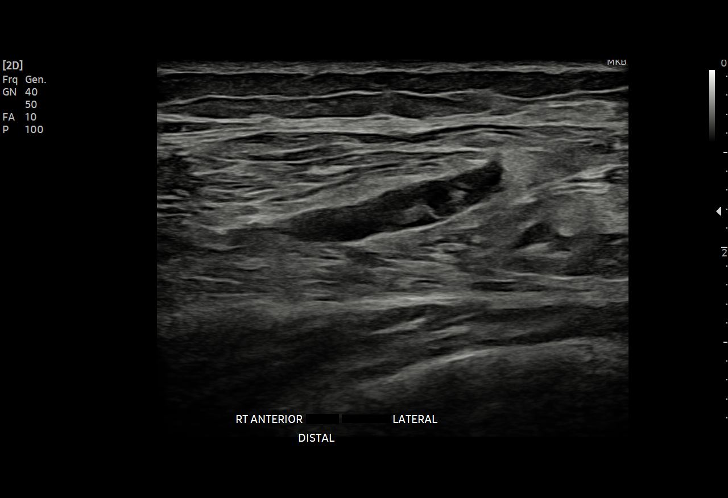
[im 10/22]
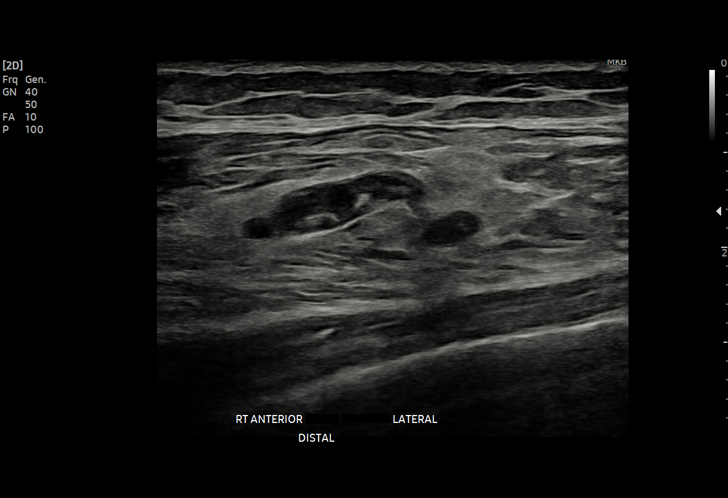
[im 12/22]
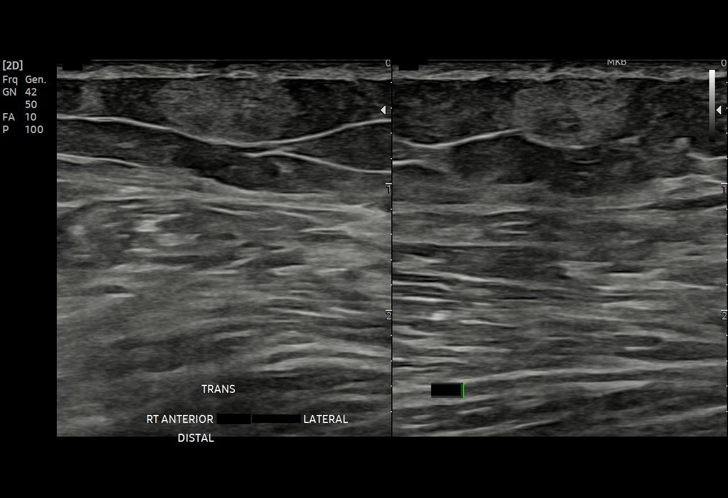
[im 13/22]
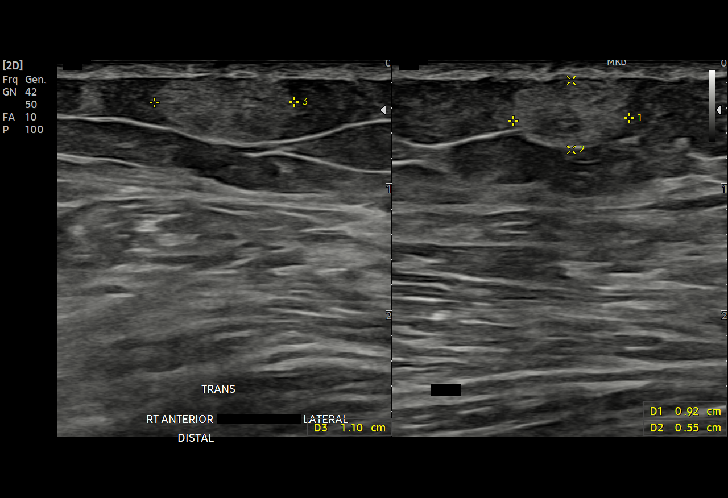
[im 14/22]
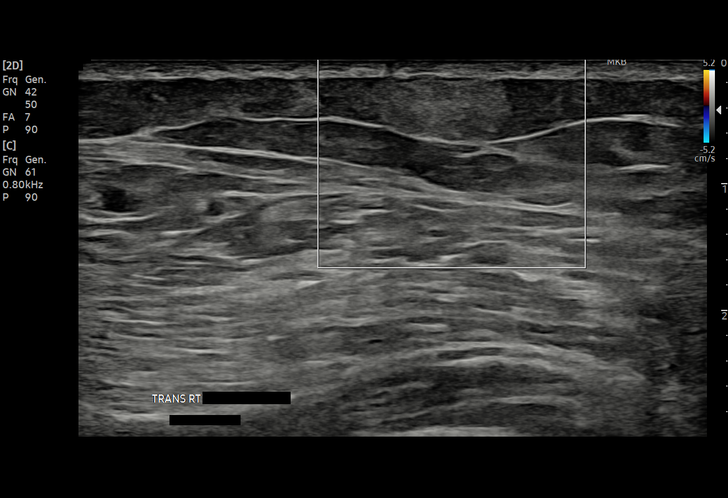
[im 16/22]
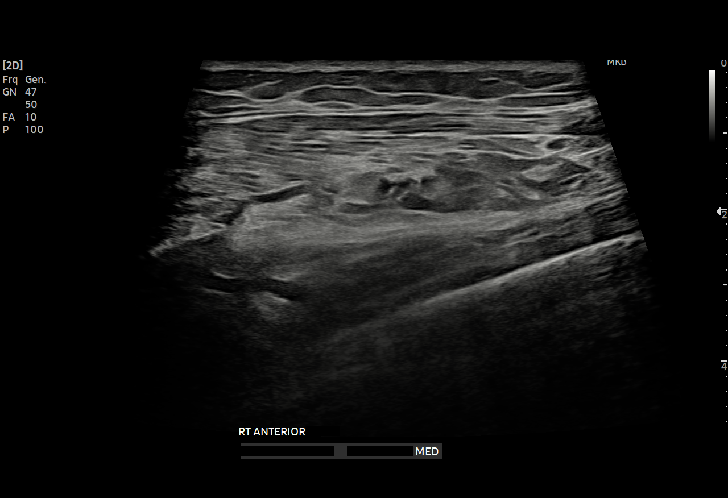
[im 17/22]
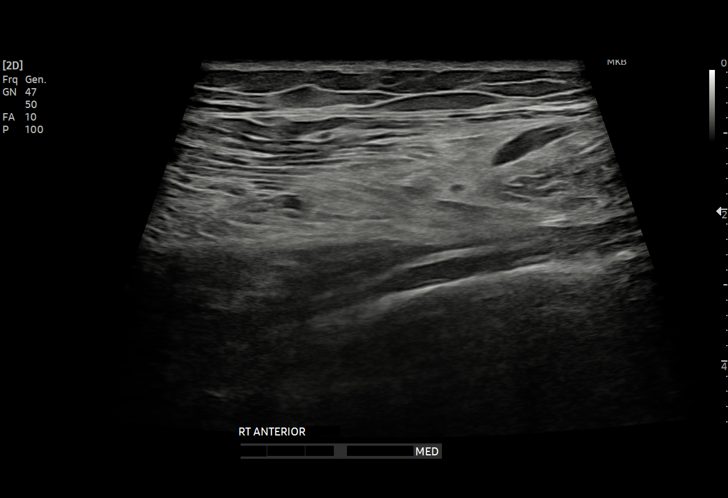
[im 19/22]
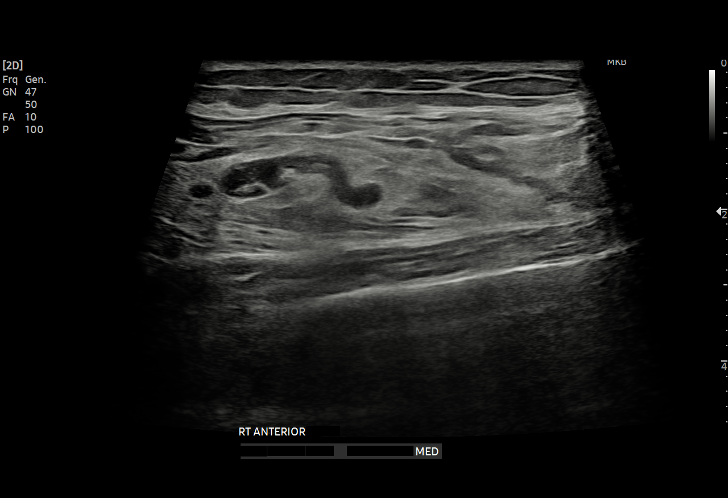
[im 20/22]
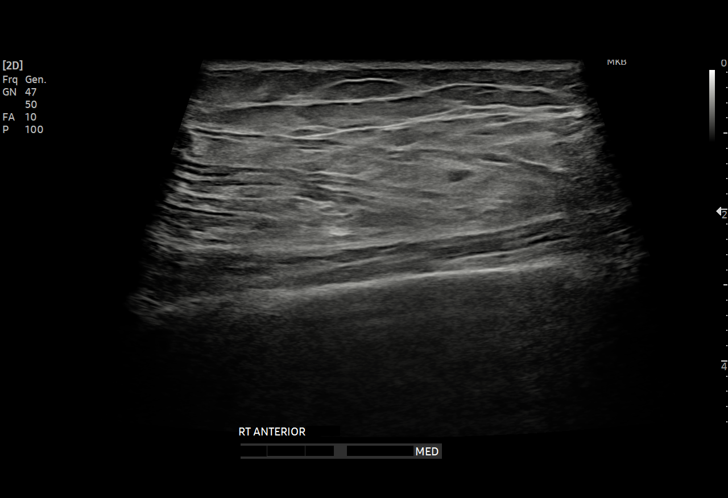
[im 22/22]
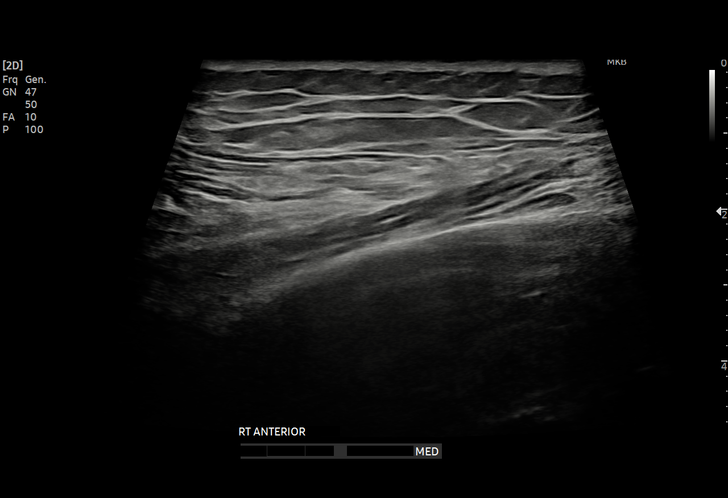

[15 of 22 positions shown; findings below may reference images not displayed]

FINDINGS: A 1.1 x 0.9 x 0.6 cm superficial well-circumscribed hyperechoic
nodular focus is noted the region of clinical concern. This is most
consistent with a benign lipoma. Multiple thrombosed superficial
varicosities are noted. The patient experienced pain on exam over
these areas. No abnormal fluid collections are noted.
IMPRESSION: 1. A 1.1 x 0.9 x 0.6 cm superficial well-circumscribed hyperechoic
nodular focus is noted in the region of clinical concern. This is
most consistent with a benign lipoma.

2. Multiple thrombosed superficial varicosities are noted. The
patient experience pain on exam over these areas.

## 2024-02-06 ENCOUNTER — Inpatient Hospital Stay: Attending: Internal Medicine

## 2024-02-06 ENCOUNTER — Telehealth: Payer: Self-pay | Admitting: *Deleted

## 2024-02-06 ENCOUNTER — Inpatient Hospital Stay

## 2024-02-06 DIAGNOSIS — D751 Secondary polycythemia: Secondary | ICD-10-CM | POA: Insufficient documentation

## 2024-02-06 LAB — HEMOGLOBIN AND HEMATOCRIT (CANCER CENTER ONLY)
HCT: 47.3 % — ABNORMAL HIGH (ref 36.0–46.0)
Hemoglobin: 15.6 g/dL — ABNORMAL HIGH (ref 12.0–15.0)

## 2024-02-06 NOTE — Progress Notes (Signed)
 No phlebotomy today will return in three months.

## 2024-02-06 NOTE — Telephone Encounter (Signed)
 Phlebotomy parameters are to proceed if HCT is > 50.

## 2024-02-08 ENCOUNTER — Encounter: Payer: Self-pay | Admitting: Internal Medicine

## 2024-02-22 NOTE — Progress Notes (Signed)
 Michele Andrade is a  83 y.o. female who presents for  CHIEF COMPLAINT Chief Complaint  Patient presents with  . Follow-up  . Hypertension  . Hyperlipidemia  . prediabetes  . Chronic Kidney Disease    Subjective: History of Present Illness  Pt in NAD. HTN stable on meds. Has HLD not on statin, prediabetes not on meds and known CKD. Also with polycythemia seeing Hematology. Weight stable. Feels good. Some fatigue. Sleeping well. No fever or HA's. Denies CP or SOB. No palpitations. No change in bowels or bladder. UTD with [facility].    Past Medical History:  Diagnosis Date  . Breast cancer (CMS/HHS-HCC)    Status post mastectomy and adjuvant chemotherapy  . Colon polyps   . HLD (hyperlipidemia)   . HTN (hypertension)   . Migraine   . Osteopenia    Patient Active Problem List  Diagnosis  . HTN (hypertension)  . Colon polyps  . Osteopenia  . Polycythemia  . CKD (chronic kidney disease) stage 3, GFR 30-59 ml/min (CMS-HCC)  . Contusion of left knee  . Lymphedema  . Prediabetes  . Pure hypercholesterolemia    Past Surgical History:  Procedure Laterality Date  . mastectomy (breast cancer) Right 1999  . Hysteroscopy with D&C  2000   abnormal uterine bleeding on tamoxifen therapy  . COLONOSCOPY  2005  . Right rotator cuff surgery       Current Outpatient Medications:  .  alpha lipoic acid 600 mg Cap capsule, Take by mouth, Disp: , Rfl:  .  aspirin  81 MG EC tablet, Take 81 mg by mouth once daily., Disp: , Rfl:  .  cloNIDine  HCL (CATAPRES ) 0.1 MG tablet, Take 1 tablet (0.1 mg total) by mouth 2 (two) times daily, Disp: 180 tablet, Rfl: 3 .  co-enzyme Q-10, ubiquinone, 100 mg capsule, Take 100 mg by mouth once daily., Disp: , Rfl:  .  Herbal Supplement, Herbal Name: Tumeric in am, Calcium  with Vitamin D   daily, Disp: , Rfl:  .  Herbal Supplement, 1 tablet once daily Herbal Name: [employer]. Eye supplement, Disp: , Rfl:  .  hydrALAZINE  (APRESOLINE ) 50 MG tablet, TAKE 1 TABLET 3  TIMES A DAY, Disp: 270 tablet, Rfl: 1 .  lisinopriL  (ZESTRIL ) 20 MG tablet, Take 1 tablet (20 mg total) by mouth once daily, Disp: 90 tablet, Rfl: 3 .  magnesium glycinate 100 mg Tab, Take 200 mg by mouth once daily, Disp: , Rfl:  .  multivitamin tablet, Take 1 tablet by mouth 2 (two) times daily, Disp: , Rfl:  .  omega-3 fatty acids/fish oil 340-1,000 mg capsule, Take 1 capsule by mouth 2 (two) times daily., Disp: , Rfl:  .  propranoloL  (INDERAL  LA) 80 MG 24 hr capsule, TAKE 1 CAPSULE ONCE DAILY, Disp: 90 capsule, Rfl: 1  Gabapentin and Keflex [cephalexin]  Social History   Socioeconomic History  . Marital status: Single  Tobacco Use  . Smoking status: Never    Passive exposure: Never  . Smokeless tobacco: Never  Vaping Use  . Vaping status: Never Used  Substance and Sexual Activity  . Alcohol use: No    Alcohol/week: 0.0 standard drinks of alcohol  . Drug use: No  . Sexual activity: Defer   Social Drivers of Health   Financial Resource Strain: Low Risk  (10/30/2023)   Overall Financial Resource Strain (CARDIA)   . Difficulty of Paying Living Expenses: Not hard at all  Food Insecurity: No Food Insecurity (10/30/2023)   Hunger Vital Sign   .  Worried About Programme researcher, broadcasting/film/video in the Last Year: Never true   . Ran Out of Food in the Last Year: Never true  Transportation Needs: No Transportation Needs (10/30/2023)   PRAPARE - Transportation   . Lack of Transportation (Medical): No   . Lack of Transportation (Non-Medical): No  Housing Stability: Low Risk  (10/30/2023)   Housing Stability Vital Sign   . Unable to Pay for Housing in the Last Year: No   . Number of Times Moved in the Last Year: 0   . Homeless in the Last Year: No    Family History  Problem Relation Name Age of Onset  . Cancer Mother    . No Known Problems Father      A comprehensive ROS was negative except for HPI  PE: BP 122/78   Pulse 67   Ht 157.5 cm (5' 2)   Wt 64.9 kg (143 lb)   LMP  (LMP Unknown)    SpO2 96%   BMI 26.16 kg/m  General: Alert oriented x3   Eyes: Sclera and conjunctiva clear; pupils equal round and reactive to light and accommodation; extraocular movements intact Nose: Mucosa healthy without drainage or ulceration Oropharynx: No suspicious lesions Neck: No swelling, masses, stiffness, pain, limited movement, carotid pulses normal bilaterally, thyroid normal size, no masses palpated. No bruits heard. Lungs: Respirations unlabored; clear to auscultation bilaterally Back: No spinal deformity Cardiovascular: Heart regular rate and rhythm without murmurs, gallops, or rubs Abdomen: Soft; non tender; non distended;  no masses or organomegaly Lymph Nodes: No significant cervical, supraclavicular, or axillary lymphadenopathy noted Musculoskeletal: No active joint inflammation Extremities: Normal, no edema Pulses: Dorsalis pedis palpable and symmetric bilaterally Neurologic: Alert and oriented; speech intact; face symmetrical; moves all extremities well    Office Visit on 01/29/2024  Component Date Value Ref Range Status  . Vit. B1, Whole Blood - [facility] 01/29/2024 170.7  66.5 - 200.0 nmol/L Final  . Vitamin B6 - [facility] 01/29/2024 41.4  3.4 - 65.2 ug/L Final  . Total IgG - [facility] 01/29/2024 1027  586 - 1602 mg/dL Final  . Immunoglobulin A, Qn, Serum - [facility] 01/29/2024 119  64 - 422 mg/dL Final  . IgM - [facility] 01/29/2024 135  26 - 217 mg/dL Final  . Protein Total - [facility] 01/29/2024 6.4  6.0 - 8.5 g/dL Final  . Albumin - [facility] 01/29/2024 3.6  2.9 - 4.4 g/dL Final  . Joeyj-8-Honalopw - [facility] 01/29/2024 0.2  0.0 - 0.4 g/dL Final  . Joeyj-7-Honalopw - [facility] 01/29/2024 0.7  0.4 - 1.0 g/dL Final  . Beta Globulin - [facility] 01/29/2024 1.0  0.7 - 1.3 g/dL Final  . Gamma Globulin - [facility] 01/29/2024 0.9  0.4 - 1.8 g/dL Final  . M-Spike - [facility] 01/29/2024 Not Observed  Not Observed g/dL Final  . Globulin, Total - [facility] 01/29/2024 2.8  2.2 - 3.9 g/dL Final  . A/G Ratio  - [facility] 01/29/2024 1.3  0.7 - 1.7 Final  . IFE 1 - [facility] 01/29/2024 Comment   Final  . Please note: - [facility] 01/29/2024 Comment   Final  . Sed Rate - [facility] 01/29/2024 12  0 - 40 mm/hr Final  . Vitamin D , 25-Hydroxy - [facility] 01/29/2024 32.7  30.0 - 100.0 ng/mL Final  Appointment on 11/16/2023  Component Date Value Ref Range Status  . Vit. B1, Whole Blood - [facility] 11/16/2023 148.2  66.5 - 200.0 nmol/L Final  . Vitamin D , 25-Hydroxy - [facility] 11/16/2023 30.9  30.0 -  100.0 ng/mL Final  . Sedimentation Rate-Automated 11/16/2023 2  0 - 30 mm/hr Final  . Total IgG - [facility] 11/16/2023 902  586 - 1602 mg/dL Final  . Immunoglobulin A, Qn, Serum - [facility] 11/16/2023 111  64 - 422 mg/dL Final  . IgM - [facility] 11/16/2023 130  26 - 217 mg/dL Final  . Protein Total - [facility] 11/16/2023 6.5  6.0 - 8.5 g/dL Final  . Albumin - [facility] 11/16/2023 4.0  2.9 - 4.4 g/dL Final  . Joeyj-8-Honalopw - [facility] 11/16/2023 0.2  0.0 - 0.4 g/dL Final  . Joeyj-7-Honalopw - [facility] 11/16/2023 0.6  0.4 - 1.0 g/dL Final  . Beta Globulin - [facility] 11/16/2023 1.0  0.7 - 1.3 g/dL Final  . Gamma Globulin - [facility] 11/16/2023 0.8  0.4 - 1.8 g/dL Final  . M-Spike - [facility] 11/16/2023 Not Observed  Not Observed g/dL Final  . Globulin, Total - [facility] 11/16/2023 2.5  2.2 - 3.9 g/dL Final  . A/G Ratio - [facility] 11/16/2023 1.7  0.7 - 1.7 Final  . IFE 1 - [facility] 11/16/2023 Comment   Final  . Please note: - [facility] 11/16/2023 Comment   Final  . Folate (Folic Acid) 11/16/2023 >22.3  >4.0 ng/mL Final  . WBC (White Blood Cell Count) 11/16/2023 4.8  4.1 - 10.2 10^3/uL Final  . RBC (Red Blood Cell Count) 11/16/2023 5.13  4.04 - 5.48 10^6/uL Final  . Hemoglobin 11/16/2023 14.5  12.0 - 15.0 gm/dL Final  . Hematocrit 92/89/7974 45.3  35.0 - 47.0 % Final  . MCV (Mean Corpuscular Volume) 11/16/2023 88.3  80.0 - 100.0 fl Final  . MCH (Mean Corpuscular Hemoglobin) 11/16/2023 28.3  27.0 - 31.2 pg Final  . MCHC (Mean Corpuscular  Hemoglobin * 11/16/2023 32.0  32.0 - 36.0 gm/dL Final  . Platelet Count 11/16/2023 192  150 - 450 10^3/uL Final  . RDW-CV (Red Cell Distribution Widt* 11/16/2023 14.4  11.6 - 14.8 % Final  . MPV (Mean Platelet Volume) 11/16/2023 11.4  9.4 - 12.4 fl Final  . Neutrophils 11/16/2023 3.03  1.50 - 7.80 10^3/uL Final  . Lymphocytes 11/16/2023 1.07  1.00 - 3.60 10^3/uL Final  . Monocytes 11/16/2023 0.54  0.00 - 1.50 10^3/uL Final  . Eosinophils 11/16/2023 0.08  0.00 - 0.55 10^3/uL Final  . Basophils 11/16/2023 0.03  0.00 - 0.09 10^3/uL Final  . Neutrophil % 11/16/2023 63.7  32.0 - 70.0 % Final  . Lymphocyte % 11/16/2023 22.5  10.0 - 50.0 % Final  . Monocyte % 11/16/2023 11.3  4.0 - 13.0 % Final  . Eosinophil % 11/16/2023 1.7  1.0 - 5.0 % Final  . Basophil% 11/16/2023 0.6  0.0 - 2.0 % Final  . Immature Granulocyte % 11/16/2023 0.2  <=0.7 % Final  . Immature Granulocyte Count 11/16/2023 0.01  <=0.06 10^3/L Final  . Glucose 11/16/2023 93  70 - 110 mg/dL Final  . Sodium 92/89/7974 143  136 - 145 mmol/L Final  . Potassium 11/16/2023 4.8  3.6 - 5.1 mmol/L Final  . Chloride 11/16/2023 108  97 - 109 mmol/L Final  . Carbon Dioxide (CO2) 11/16/2023 30.1  22.0 - 32.0 mmol/L Final  . Urea Nitrogen (BUN) 11/16/2023 22  7 - 25 mg/dL Final  . Creatinine 92/89/7974 0.8  0.6 - 1.1 mg/dL Final  . Glomerular Filtration Rate (eGFR) 11/16/2023 74  >60 mL/min/1.73sq m Final  . Calcium  11/16/2023 9.8  8.7 - 10.3 mg/dL Final  . AST  92/89/7974 15  8 - 39  U/L Final  . ALT  11/16/2023 14  5 - 38 U/L Final  . Alk Phos (alkaline Phosphatase) 11/16/2023 49  34 - 104 U/L Final  . Albumin 11/16/2023 4.2  3.5 - 4.8 g/dL Final  . Bilirubin, Total 11/16/2023 0.5  0.3 - 1.2 mg/dL Final  . Protein, Total 11/16/2023 6.5  6.1 - 7.9 g/dL Final  . A/G Ratio 92/89/7974 1.8  1.0 - 5.0 gm/dL Final  . Cholesterol, Total 11/16/2023 202 (H)  100 - 200 mg/dL Final  . Triglyceride 92/89/7974 129  35 - 199 mg/dL Final  . HDL (High  Density Lipoprotein) Cho* 11/16/2023 57.2  35.0 - 85.0 mg/dL Final  . LDL Calculated 11/16/2023 880  0 - 130 mg/dL Final  . VLDL Cholesterol 11/16/2023 26  mg/dL Final  . Cholesterol/HDL Ratio 11/16/2023 3.5   Final  . Thyroid Stimulating Hormone (TSH) 11/16/2023 1.248  0.450-5.330 uIU/ml uIU/mL Final  . Color 11/16/2023 Light Yellow  Colorless, Straw, Light Yellow, Yellow, Dark Yellow Final  . Clarity 11/16/2023 Clear  Clear Final  . Specific Gravity 11/16/2023 1.010  1.005 - 1.030 Final  . pH, Urine 11/16/2023 7.5  5.0 - 8.0 Final  . Protein, Urinalysis 11/16/2023 Negative  Negative mg/dL Final  . Glucose, Urinalysis 11/16/2023 Negative  Negative mg/dL Final  . Ketones, Urinalysis 11/16/2023 Negative  Negative mg/dL Final  . Blood, Urinalysis 11/16/2023 Trace (!)  Negative Final  . Nitrite, Urinalysis 11/16/2023 Negative  Negative Final  . Leukocyte Esterase, Urinalysis 11/16/2023 Trace (!)  Negative Final  . Bilirubin, Urinalysis 11/16/2023 Negative  Negative Final  . Urobilinogen, Urinalysis 11/16/2023 0.2  0.2 - 1.0 mg/dL Final  . WBC, UA 92/89/7974 1  <=5 /hpf Final  . Red Blood Cells, Urinalysis 11/16/2023 7 (H)  <=3 /hpf Final  . Bacteria, Urinalysis 11/16/2023 0-5  0 - 5 /hpf Final  . Squamous Epithelial Cells, Urinaly* 11/16/2023 0  /hpf Final  . Hemoglobin A1C 11/16/2023 5.9 (H)  4.2 - 5.6 % Final  . Average Blood Glucose (Calc) 11/16/2023 123  mg/dL Final  Office Visit on 08/21/2023  Component Date Value Ref Range Status  . WBC (White Blood Cell Count) 08/21/2023 7.2  4.1 - 10.2 10^3/uL Final  . RBC (Red Blood Cell Count) 08/21/2023 4.99  4.04 - 5.48 10^6/uL Final  . Hemoglobin 08/21/2023 14.4  12.0 - 15.0 gm/dL Final  . Hematocrit 95/85/7974 44.1  35.0 - 47.0 % Final  . MCV (Mean Corpuscular Volume) 08/21/2023 88.4  80.0 - 100.0 fl Final  . MCH (Mean Corpuscular Hemoglobin) 08/21/2023 28.9  27.0 - 31.2 pg Final  . MCHC (Mean Corpuscular Hemoglobin * 08/21/2023 32.7  32.0 -  36.0 gm/dL Final  . Platelet Count 08/21/2023 206  150 - 450 10^3/uL Final  . RDW-CV (Red Cell Distribution Widt* 08/21/2023 13.5  11.6 - 14.8 % Final  . MPV (Mean Platelet Volume) 08/21/2023 11.0  9.4 - 12.4 fl Final  . Neutrophils 08/21/2023 4.86  1.50 - 7.80 10^3/uL Final  . Lymphocytes 08/21/2023 1.50  1.00 - 3.60 10^3/uL Final  . Monocytes 08/21/2023 0.74  0.00 - 1.50 10^3/uL Final  . Eosinophils 08/21/2023 0.08  0.00 - 0.55 10^3/uL Final  . Basophils 08/21/2023 0.03  0.00 - 0.09 10^3/uL Final  . Neutrophil % 08/21/2023 67.4  32.0 - 70.0 % Final  . Lymphocyte % 08/21/2023 20.8  10.0 - 50.0 % Final  . Monocyte % 08/21/2023 10.2  4.0 - 13.0 % Final  . Eosinophil % 08/21/2023 1.1  1.0 - 5.0 % Final  . Basophil% 08/21/2023 0.4  0.0 - 2.0 % Final  . Immature Granulocyte % 08/21/2023 0.1  <=0.7 % Final  . Immature Granulocyte Count 08/21/2023 0.01  <=0.06 10^3/L Final  . Glucose 08/21/2023 119 (H)  70 - 110 mg/dL Final  . Sodium 95/85/7974 142  136 - 145 mmol/L Final  . Potassium 08/21/2023 4.0  3.6 - 5.1 mmol/L Final  . Chloride 08/21/2023 107  97 - 109 mmol/L Final  . Carbon Dioxide (CO2) 08/21/2023 27.3  22.0 - 32.0 mmol/L Final  . Urea Nitrogen (BUN) 08/21/2023 20  7 - 25 mg/dL Final  . Creatinine 95/85/7974 1.1  0.6 - 1.1 mg/dL Final  . Glomerular Filtration Rate (eGFR) 08/21/2023 50 (L)  >60 mL/min/1.73sq m Final  . Calcium  08/21/2023 9.3  8.7 - 10.3 mg/dL Final  . AST  95/85/7974 17  8 - 39 U/L Final  . ALT  08/21/2023 16  5 - 38 U/L Final  . Alk Phos (alkaline Phosphatase) 08/21/2023 54  34 - 104 U/L Final  . Albumin 08/21/2023 4.1  3.5 - 4.8 g/dL Final  . Bilirubin, Total 08/21/2023 0.4  0.3 - 1.2 mg/dL Final  . Protein, Total 08/21/2023 6.3  6.1 - 7.9 g/dL Final  . A/G Ratio 95/85/7974 1.9  1.0 - 5.0 gm/dL Final  . Color 95/85/7974 Yellow  Colorless, Straw, Light Yellow, Yellow, Dark Yellow Final  . Clarity 08/21/2023 Cloudy (!)  Clear Final  . Specific Gravity 08/21/2023  1.019  1.005 - 1.030 Final  . pH, Urine 08/21/2023 7.0  5.0 - 8.0 Final  . Protein, Urinalysis 08/21/2023 Negative  Negative mg/dL Final  . Glucose, Urinalysis 08/21/2023 Negative  Negative mg/dL Final  . Ketones, Urinalysis 08/21/2023 Negative  Negative mg/dL Final  . Blood, Urinalysis 08/21/2023 Negative  Negative Final  . Nitrite, Urinalysis 08/21/2023 Negative  Negative Final  . Leukocyte Esterase, Urinalysis 08/21/2023 1+ (!)  Negative Final  . Bilirubin, Urinalysis 08/21/2023 Negative  Negative Final  . Urobilinogen, Urinalysis 08/21/2023 0.2  0.2 - 1.0 mg/dL Final  . WBC, UA 95/85/7974 0  <=5 /hpf Final  . Red Blood Cells, Urinalysis 08/21/2023 0  <=3 /hpf Final  . Bacteria, Urinalysis 08/21/2023 0-5  0 - 5 /hpf Final  . Squamous Epithelial Cells, Urinaly* 08/21/2023 0  /hpf Final  . Glucose 09/21/2023 94  70 - 110 mg/dL Final  . Sodium 94/84/7974 142  136 - 145 mmol/L Final  . Potassium 09/21/2023 4.1  3.6 - 5.1 mmol/L Final  . Chloride 09/21/2023 106  97 - 109 mmol/L Final  . Carbon Dioxide (CO2) 09/21/2023 29.1  22.0 - 32.0 mmol/L Final  . Urea Nitrogen (BUN) 09/21/2023 16  7 - 25 mg/dL Final  . Creatinine 94/84/7974 0.8  0.6 - 1.1 mg/dL Final  . Glomerular Filtration Rate (eGFR) 09/21/2023 74  >60 mL/min/1.73sq m Final  . Calcium  09/21/2023 9.4  8.7 - 10.3 mg/dL Final  . Albumin 94/84/7974 4.3  3.5 - 4.8 g/dL Final  . Phosphorus 94/84/7974 4.1  2.5 - 5.0 mg/dL Final  Appointment on 98/82/7974  Component Date Value Ref Range Status  . Cholesterol, Total 05/26/2023 196  100 - 200 mg/dL Final  . Triglyceride 98/82/7974 131  35 - 199 mg/dL Final  . HDL (High Density Lipoprotein) Cho* 05/26/2023 67.4  35.0 - 85.0 mg/dL Final  . LDL Calculated 05/26/2023 897  0 - 130 mg/dL Final  . VLDL Cholesterol 05/26/2023 26  mg/dL Final  .  Cholesterol/HDL Ratio 05/26/2023 2.9   Final  . WBC (White Blood Cell Count) 05/26/2023 5.8  4.1 - 10.2 10^3/uL Final  . RBC (Red Blood Cell Count)  05/26/2023 5.16  4.04 - 5.48 10^6/uL Final  . Hemoglobin 05/26/2023 15.3 (H)  12.0 - 15.0 gm/dL Final  . Hematocrit 98/82/7974 46.8  35.0 - 47.0 % Final  . MCV (Mean Corpuscular Volume) 05/26/2023 90.7  80.0 - 100.0 fl Final  . MCH (Mean Corpuscular Hemoglobin) 05/26/2023 29.7  27.0 - 31.2 pg Final  . MCHC (Mean Corpuscular Hemoglobin * 05/26/2023 32.7  32.0 - 36.0 gm/dL Final  . Platelet Count 05/26/2023 216  150 - 450 10^3/uL Final  . RDW-CV (Red Cell Distribution Widt* 05/26/2023 13.7  11.6 - 14.8 % Final  . MPV (Mean Platelet Volume) 05/26/2023 11.4  9.4 - 12.4 fl Final  . Neutrophils 05/26/2023 3.80  1.50 - 7.80 10^3/uL Final  . Lymphocytes 05/26/2023 1.25  1.00 - 3.60 10^3/uL Final  . Monocytes 05/26/2023 0.58  0.00 - 1.50 10^3/uL Final  . Eosinophils 05/26/2023 0.09  0.00 - 0.55 10^3/uL Final  . Basophils 05/26/2023 0.03  0.00 - 0.09 10^3/uL Final  . Neutrophil % 05/26/2023 65.9  32.0 - 70.0 % Final  . Lymphocyte % 05/26/2023 21.7  10.0 - 50.0 % Final  . Monocyte % 05/26/2023 10.1  4.0 - 13.0 % Final  . Eosinophil % 05/26/2023 1.6  1.0 - 5.0 % Final  . Basophil% 05/26/2023 0.5  0.0 - 2.0 % Final  . Immature Granulocyte % 05/26/2023 0.2  <=0.7 % Final  . Immature Granulocyte Count 05/26/2023 0.01  <=0.06 10^3/L Final  . Glucose 05/26/2023 103  70 - 110 mg/dL Final  . Sodium 98/82/7974 141  136 - 145 mmol/L Final  . Potassium 05/26/2023 4.1  3.6 - 5.1 mmol/L Final  . Chloride 05/26/2023 108  97 - 109 mmol/L Final  . Carbon Dioxide (CO2) 05/26/2023 26.3  22.0 - 32.0 mmol/L Final  . Urea Nitrogen (BUN) 05/26/2023 16  7 - 25 mg/dL Final  . Creatinine 98/82/7974 0.8  0.6 - 1.1 mg/dL Final  . Glomerular Filtration Rate (eGFR) 05/26/2023 74  >60 mL/min/1.73sq m Final  . Calcium  05/26/2023 9.4  8.7 - 10.3 mg/dL Final  . AST  98/82/7974 17  8 - 39 U/L Final  . ALT  05/26/2023 17  5 - 38 U/L Final  . Alk Phos (alkaline Phosphatase) 05/26/2023 56  34 - 104 U/L Final  . Albumin 05/26/2023  4.1  3.5 - 4.8 g/dL Final  . Bilirubin, Total 05/26/2023 0.5  0.3 - 1.2 mg/dL Final  . Protein, Total 05/26/2023 6.5  6.1 - 7.9 g/dL Final  . A/G Ratio 98/82/7974 1.7  1.0 - 5.0 gm/dL Final  . Thyroid Stimulating Hormone (TSH) 05/26/2023 1.997  0.450-5.330 uIU/ml uIU/mL Final  . Color 05/26/2023 Colorless  Colorless, Straw, Light Yellow, Yellow, Dark Yellow Final  . Clarity 05/26/2023 Clear  Clear Final  . Specific Gravity 05/26/2023 1.009  1.005 - 1.030 Final  . pH, Urine 05/26/2023 7.0  5.0 - 8.0 Final  . Protein, Urinalysis 05/26/2023 Negative  Negative mg/dL Final  . Glucose, Urinalysis 05/26/2023 Negative  Negative mg/dL Final  . Ketones, Urinalysis 05/26/2023 Negative  Negative mg/dL Final  . Blood, Urinalysis 05/26/2023 Negative  Negative Final  . Nitrite, Urinalysis 05/26/2023 Negative  Negative Final  . Leukocyte Esterase, Urinalysis 05/26/2023 1+ (!)  Negative Final  . Bilirubin, Urinalysis 05/26/2023 Negative  Negative Final  . Urobilinogen,  Urinalysis 05/26/2023 0.2  0.2 - 1.0 mg/dL Final  . WBC, UA 98/82/7974 2  <=5 /hpf Final  . Red Blood Cells, Urinalysis 05/26/2023 4 (H)  <=3 /hpf Final  . Bacteria, Urinalysis 05/26/2023 0-5  0 - 5 /hpf Final  . Squamous Epithelial Cells, Urinaly* 05/26/2023 0  /hpf Final  . Hemoglobin A1C 05/26/2023 5.9 (H)  4.2 - 5.6 % Final  . Average Blood Glucose (Calc) 05/26/2023 123  mg/dL Final  . Hemoccult ICT 05/26/2023 Negative  Negative Final  . Hemoccult ICT 05/26/2023 Negative  Negative Final  . Vitamin B12 05/26/2023 711  >300 pg/mL Final  . Vitamin D , 25-Hydroxy - [facility] 05/26/2023 32.3  30.0 - 100.0 ng/mL Final   DIAGNOSIS: Primary hypertension  (primary encounter diagnosis)  Stage 3 chronic kidney disease, unspecified whether stage 3a or 3b CKD (CMS-HCC)  Essential hypertension  Pure hypercholesterolemia  Prediabetes   PLAN: HTN- stable, same meds HLD- diet/exercise, labs 2 weeks Prediabetes- diet/exercise/water, labs 2  weeks Polycythemia- labs 2 weeks RTC 3 mo, sooner if needed     Attestation Statement:   I personally performed the service. (TP)  Reyes JONETTA Costa, MD, MD

## 2024-03-01 ENCOUNTER — Observation Stay

## 2024-03-01 ENCOUNTER — Other Ambulatory Visit: Payer: Self-pay

## 2024-03-01 ENCOUNTER — Emergency Department

## 2024-03-01 ENCOUNTER — Observation Stay
Admission: EM | Admit: 2024-03-01 | Discharge: 2024-03-02 | Disposition: A | Discharge status: Home / Self Care | Attending: Internal Medicine | Admitting: Internal Medicine

## 2024-03-01 ENCOUNTER — Encounter: Payer: Self-pay | Admitting: Internal Medicine

## 2024-03-01 DIAGNOSIS — E785 Hyperlipidemia, unspecified: Secondary | ICD-10-CM | POA: Diagnosis present

## 2024-03-01 DIAGNOSIS — R410 Disorientation, unspecified: Secondary | ICD-10-CM | POA: Diagnosis present

## 2024-03-01 DIAGNOSIS — G43909 Migraine, unspecified, not intractable, without status migrainosus: Secondary | ICD-10-CM | POA: Diagnosis present

## 2024-03-01 DIAGNOSIS — I674 Hypertensive encephalopathy: Secondary | ICD-10-CM | POA: Diagnosis not present

## 2024-03-01 DIAGNOSIS — D751 Secondary polycythemia: Secondary | ICD-10-CM | POA: Diagnosis not present

## 2024-03-01 DIAGNOSIS — Z9013 Acquired absence of bilateral breasts and nipples: Secondary | ICD-10-CM | POA: Insufficient documentation

## 2024-03-01 DIAGNOSIS — I129 Hypertensive chronic kidney disease with stage 1 through stage 4 chronic kidney disease, or unspecified chronic kidney disease: Secondary | ICD-10-CM | POA: Diagnosis not present

## 2024-03-01 DIAGNOSIS — R7303 Prediabetes: Secondary | ICD-10-CM | POA: Insufficient documentation

## 2024-03-01 DIAGNOSIS — Z79899 Other long term (current) drug therapy: Secondary | ICD-10-CM | POA: Insufficient documentation

## 2024-03-01 DIAGNOSIS — N182 Chronic kidney disease, stage 2 (mild): Secondary | ICD-10-CM | POA: Diagnosis not present

## 2024-03-01 DIAGNOSIS — N183 Chronic kidney disease, stage 3 unspecified: Secondary | ICD-10-CM | POA: Diagnosis not present

## 2024-03-01 DIAGNOSIS — G459 Transient cerebral ischemic attack, unspecified: Secondary | ICD-10-CM | POA: Diagnosis present

## 2024-03-01 DIAGNOSIS — R519 Headache, unspecified: Secondary | ICD-10-CM | POA: Diagnosis not present

## 2024-03-01 DIAGNOSIS — Z853 Personal history of malignant neoplasm of breast: Secondary | ICD-10-CM | POA: Diagnosis not present

## 2024-03-01 DIAGNOSIS — R600 Localized edema: Secondary | ICD-10-CM | POA: Diagnosis not present

## 2024-03-01 DIAGNOSIS — I159 Secondary hypertension, unspecified: Secondary | ICD-10-CM

## 2024-03-01 DIAGNOSIS — Z7982 Long term (current) use of aspirin: Secondary | ICD-10-CM | POA: Insufficient documentation

## 2024-03-01 LAB — CBC WITH DIFFERENTIAL/PLATELET
Abs Immature Granulocytes: 0.03 K/uL (ref 0.00–0.07)
Basophils Absolute: 0 K/uL (ref 0.0–0.1)
Basophils Relative: 0 %
Eosinophils Absolute: 0 K/uL (ref 0.0–0.5)
Eosinophils Relative: 0 %
HCT: 54.2 % — ABNORMAL HIGH (ref 36.0–46.0)
Hemoglobin: 17.5 g/dL — ABNORMAL HIGH (ref 12.0–15.0)
Immature Granulocytes: 0 %
Lymphocytes Relative: 11 %
Lymphs Abs: 1 K/uL (ref 0.7–4.0)
MCH: 29.7 pg (ref 26.0–34.0)
MCHC: 32.3 g/dL (ref 30.0–36.0)
MCV: 92 fL (ref 80.0–100.0)
Monocytes Absolute: 0.4 K/uL (ref 0.1–1.0)
Monocytes Relative: 4 %
Neutro Abs: 7.7 K/uL (ref 1.7–7.7)
Neutrophils Relative %: 85 %
Platelets: 223 K/uL (ref 150–400)
RBC: 5.89 MIL/uL — ABNORMAL HIGH (ref 3.87–5.11)
RDW: 13.5 % (ref 11.5–15.5)
WBC: 9.1 K/uL (ref 4.0–10.5)
nRBC: 0 % (ref 0.0–0.2)

## 2024-03-01 LAB — URINALYSIS, COMPLETE (UACMP) WITH MICROSCOPIC
Bacteria, UA: NONE SEEN
Bilirubin Urine: NEGATIVE
Glucose, UA: NEGATIVE mg/dL
Hgb urine dipstick: NEGATIVE
Ketones, ur: NEGATIVE mg/dL
Leukocytes,Ua: NEGATIVE
Nitrite: NEGATIVE
Protein, ur: NEGATIVE mg/dL
Specific Gravity, Urine: 1.003 — ABNORMAL LOW (ref 1.005–1.030)
Squamous Epithelial / HPF: 0 /HPF (ref 0–5)
pH: 7 (ref 5.0–8.0)

## 2024-03-01 LAB — COMPREHENSIVE METABOLIC PANEL WITH GFR
ALT: 18 U/L (ref 0–44)
AST: 21 U/L (ref 15–41)
Albumin: 4.2 g/dL (ref 3.5–5.0)
Alkaline Phosphatase: 51 U/L (ref 38–126)
Anion gap: 13 (ref 5–15)
BUN: 19 mg/dL (ref 8–23)
CO2: 23 mmol/L (ref 22–32)
Calcium: 9.7 mg/dL (ref 8.9–10.3)
Chloride: 105 mmol/L (ref 98–111)
Creatinine, Ser: 0.67 mg/dL (ref 0.44–1.00)
GFR, Estimated: >60 mL/min (ref >60)
Glucose, Bld: 104 mg/dL — ABNORMAL HIGH (ref 70–99)
Potassium: 3.7 mmol/L (ref 3.5–5.1)
Sodium: 141 mmol/L (ref 135–145)
Total Bilirubin: 1 mg/dL (ref 0.0–1.2)
Total Protein: 7.5 g/dL (ref 6.5–8.1)

## 2024-03-01 LAB — TROPONIN I (HIGH SENSITIVITY)
Troponin I (High Sensitivity): 6 ng/L (ref <18)
Troponin I (High Sensitivity): 7 ng/L (ref <18)

## 2024-03-01 LAB — TSH: TSH: 1.034 u[IU]/mL (ref 0.350–4.500)

## 2024-03-01 LAB — BRAIN NATRIURETIC PEPTIDE: B Natriuretic Peptide: 252.1 pg/mL — ABNORMAL HIGH (ref 0.0–100.0)

## 2024-03-01 MED ORDER — PROPRANOLOL HCL ER 80 MG PO CP24
80.0000 mg | ORAL_CAPSULE | Freq: Every day | ORAL | Status: DC
  Administered 2024-03-02: 80 mg via ORAL
  Filled 2024-03-01: qty 1

## 2024-03-01 MED ORDER — LISINOPRIL 20 MG PO TABS
20.0000 mg | ORAL_TABLET | Freq: Every day | ORAL | Status: DC
  Administered 2024-03-02: 20 mg via ORAL
  Filled 2024-03-01: qty 1

## 2024-03-01 MED ORDER — ACETAMINOPHEN 325 MG PO TABS
650.0000 mg | ORAL_TABLET | Freq: Four times a day (QID) | ORAL | Status: DC | PRN
  Administered 2024-03-01: 650 mg via ORAL
  Filled 2024-03-01: qty 2

## 2024-03-01 MED ORDER — HYDRALAZINE HCL 50 MG PO TABS
50.0000 mg | ORAL_TABLET | Freq: Once | ORAL | Status: AC
  Administered 2024-03-01: 50 mg via ORAL
  Filled 2024-03-01: qty 1

## 2024-03-01 MED ORDER — ENOXAPARIN SODIUM 40 MG/0.4ML IJ SOSY
40.0000 mg | PREFILLED_SYRINGE | INTRAMUSCULAR | Status: DC
  Administered 2024-03-01: 40 mg via SUBCUTANEOUS
  Filled 2024-03-01: qty 0.4

## 2024-03-01 MED ORDER — ASPIRIN 81 MG PO TBEC
81.0000 mg | DELAYED_RELEASE_TABLET | Freq: Every day | ORAL | Status: DC
  Administered 2024-03-01: 81 mg via ORAL
  Filled 2024-03-01: qty 1

## 2024-03-01 MED ORDER — ASPIRIN 81 MG PO TBEC
81.0000 mg | DELAYED_RELEASE_TABLET | Freq: Every day | ORAL | Status: DC
  Administered 2024-03-02: 81 mg via ORAL
  Filled 2024-03-01: qty 1

## 2024-03-01 MED ORDER — HYDRALAZINE HCL 50 MG PO TABS
50.0000 mg | ORAL_TABLET | Freq: Three times a day (TID) | ORAL | Status: DC
  Administered 2024-03-01 – 2024-03-02 (×3): 50 mg via ORAL
  Filled 2024-03-01 (×3): qty 1

## 2024-03-01 MED ORDER — CLONIDINE HCL 0.1 MG PO TABS
0.1000 mg | ORAL_TABLET | Freq: Once | ORAL | Status: AC
  Administered 2024-03-01: 0.1 mg via ORAL
  Filled 2024-03-01: qty 1

## 2024-03-01 MED ORDER — CLONIDINE HCL 0.1 MG PO TABS
0.1000 mg | ORAL_TABLET | Freq: Two times a day (BID) | ORAL | Status: DC
  Administered 2024-03-01 – 2024-03-02 (×2): 0.1 mg via ORAL
  Filled 2024-03-01 (×2): qty 1

## 2024-03-01 MED ORDER — ACETAMINOPHEN 650 MG RE SUPP: 650.0000 mg | Freq: Four times a day (QID) | RECTAL | Status: DC | PRN

## 2024-03-01 MED ORDER — ONDANSETRON HCL 4 MG/2ML IJ SOLN: 4.0000 mg | Freq: Four times a day (QID) | INTRAMUSCULAR | Status: DC | PRN

## 2024-03-01 MED ORDER — ONDANSETRON HCL 4 MG PO TABS: 4.0000 mg | ORAL_TABLET | Freq: Four times a day (QID) | ORAL | Status: DC | PRN

## 2024-03-01 MED ORDER — ASPIRIN 81 MG PO TBEC
81.0000 mg | DELAYED_RELEASE_TABLET | Freq: Every day | ORAL | Status: DC
Start: 2024-03-01 — End: 2024-03-01

## 2024-03-01 NOTE — Progress Notes (Signed)
 Received patient from ED. Pt is stable, on RA. Refusing IV placement and requesting to have diet ordered. Passed swallow screen.  MD aware.

## 2024-03-01 NOTE — Plan of Care (Signed)
   Problem: Education: Goal: Knowledge of General Education information will improve Description: Including pain rating scale, medication(s)/side effects and non-pharmacologic comfort measures Outcome: Progressing   Problem: Nutrition: Goal: Adequate nutrition will be maintained Outcome: Progressing   Problem: Coping: Goal: Level of anxiety will decrease Outcome: Progressing

## 2024-03-01 NOTE — H&P (Addendum)
 History and Physical    Michele Andrade FMW:[id number] DOB: 09-27-1940 DOA: 03/01/2024  DOS: the patient was seen and examined on 03/01/2024  PCP: Auston Reyes BIRCH, MD   Patient coming from: Home  I have personally briefly reviewed patient's old medical records in [facility] and CareEverywhere  HPI:  Michele Andrade is a 83 y.o. year old female with past medical history of hypertension, hyperlipidemia, migraines, polycythemia, chronic kidney disease, prediabetes, previous breast cancer s/p mastectomy.  She presents to [facility] regional ED after an episode of confusion this morning that began this morning and was resolving on arrival to the ED.  Patient reports she went to sleep last evening feeling normal and she woke up this morning around 6 AM feeling confused and having difficulty verbalizing.  She lives at home alone and unfortunately this was not witnessed by anyone else.  This morning she drove herself to continue to clinic for scheduled blood work after taking her regular medications this morning but reports she had difficulty getting there as she made several wrong turns despite having gone to the same clinic for many years and being familiar with how to get there.  Once at cardiology clinic she shared her symptoms with the nursing staff who sent her to the ED.  She endorses headache and some leg swelling.  She denies dizziness, slurred speech, weakness, vision changes, or change in sensorium.  ED Course: On arrival to [facility] regional ED patient was noted to be afebrile temp 36.9 C, BP 205/95, HR 64, RR 16, SpO2 97% on room air.  CT head and MRI obtained and both are negative for acute intracranial abnormality.  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  Labs notable for hemoglobin 17.5 and hematocrit 54.2, BNP 252.1.  Hemoglobin A1c drawn this morning at [facility] clinic 5.9%. [facility] contacted for admission.  Review of Systems: As mentioned in the history of present illness.  All other systems reviewed and are negative.  Review of Systems  Constitutional:  Negative for chills and fever.  HENT:  Negative for congestion.   Eyes:  Negative for blurred vision and double vision.  Respiratory:  Negative for cough and shortness of breath.   Cardiovascular:  Positive for leg swelling. Negative for chest pain and palpitations.  Gastrointestinal:  Negative for abdominal pain, nausea and vomiting.  Neurological:  Positive for headaches. Negative for dizziness, speech change and focal weakness.    Past Medical History:  Diagnosis Date   Breast cancer [facility]) 1999   right breast ca   Erythrocytosis 09/30/2014   Hypertension    Lipoma     Past Surgical History:  Procedure Laterality Date   MASTECTOMY Right 1999   rotator cuff       reports that she has never smoked. She has never used smokeless tobacco. She reports current alcohol use of about 1.0 standard drink of alcohol per week. No history on file for drug use.  Allergies  Allergen Reactions   Gabapentin    Cephalexin Rash    Family History  Problem Relation Age of Onset   Breast cancer Mother 75    Prior to Admission medications   Medication Sig Start Date End Date Taking? Authorizing Provider  Alpha-Lipoic Acid 600 MG CAPS Take 600 mg by mouth daily.   Yes [provider]  aspirin  81 MG EC tablet Take 81 mg by mouth daily.    Yes [provider]  Calcium  Carbonate-Vitamin D  600-400 MG-UNIT per tablet  Take 1 tablet by mouth 2 (two) times daily.    Yes [provider]  cloNIDine  (CATAPRES ) 0.1 MG tablet Take 1 tablet by mouth 2 (two) times daily. 07/21/16  Yes [provider]  Coenzyme Q10 (CO Q-10) 100 MG CAPS Take 100 mg by mouth every morning.    Yes [provider]  hydrALAZINE  (APRESOLINE ) 50 MG tablet Take 1 tablet by mouth 3 (three) times daily. 10/02/17  Yes [provider]  lisinopril  (ZESTRIL ) 20 MG tablet Take 20 mg by mouth daily.   Yes  [provider]  MAGNESIUM GLYCINATE PLUS PO Take 200 mg by mouth every evening.    Yes [provider]  Multiple Vitamin (MULTI-VITAMINS) TABS Take 1 tablet by mouth 2 (two) times daily.    Yes [provider]  Multiple Vitamins-Minerals (MACULAR HEALTH FORMULA PO) Take 1 tablet by mouth daily.   Yes [provider]  Omega-3 Fatty Acids (FISH OIL) 1000 MG CAPS Take 1,000 mg by mouth 2 (two) times daily.    Yes [provider]  propranolol  ER (INDERAL  LA) 80 MG 24 hr capsule Take 80 mg by mouth every morning. 11/07/15  Yes [provider]  TURMERIC PO Take 1 tablet by mouth 2 (two) times daily.   Yes [provider]    Physical Exam: Vitals:   03/01/24 1358 03/01/24 1439 03/01/24 1500 03/01/24 1610  BP: (!) 195/80 (!) 191/106  (!) 175/87  Pulse: 64  68 67  Resp: (!) 8  16   Temp: 98.2 F (36.8 C)   98.6 F (37 C)  TempSrc:      SpO2: 98%  97% 97%    Physical Exam Vitals and nursing note reviewed.  HENT:     Head: Normocephalic.  Cardiovascular:     Rate and Rhythm: Normal rate and regular rhythm.     Heart sounds: No murmur heard.    No friction rub. No gallop.  Pulmonary:     Effort: Pulmonary effort is normal. No respiratory distress.     Breath sounds: Normal breath sounds. No wheezing, rhonchi or rales.  Abdominal:     General: Bowel sounds are normal.     Palpations: Abdomen is soft.     Tenderness: There is no abdominal tenderness.  Musculoskeletal:     Right lower leg: Edema present.     Left lower leg: Edema present.  Skin:    General: Skin is warm.     Capillary Refill: Capillary refill takes less than 2 seconds.  Neurological:     General: No focal deficit present.     Mental Status: She is alert and oriented to person, place, and time.      Labs on Admission: I have personally reviewed following labs and imaging studies  CBC: Recent Labs  Lab 03/01/24 1249  WBC 9.1  NEUTROABS 7.7  HGB  17.5*  HCT 54.2*  MCV 92.0  PLT 223   Basic Metabolic Panel: Recent Labs  Lab 03/01/24 1249  NA 141  K 3.7  CL 105  CO2 23  GLUCOSE 104*  BUN 19  CREATININE 0.67  CALCIUM  9.7   GFR: CrCl cannot be calculated (Unknown ideal weight.). Liver Function Tests: Recent Labs  Lab 03/01/24 1249  AST 21  ALT 18  ALKPHOS 51  BILITOT 1.0  PROT 7.5  ALBUMIN 4.2   No results for input(s): LIPASE, AMYLASE in the last 168 hours. No results for input(s): AMMONIA in the last 168 hours.  Coagulation Profile: No results for input(s): INR, PROTIME in the last 168 hours. Cardiac Enzymes: Recent Labs  Lab 03/01/24 1249  TROPONINIHS 6   BNP (last 3 results) Recent Labs    03/01/24 1249  BNP 252.1*   HbA1C: No results for input(s): HGBA1C in the last 72 hours. CBG: No results for input(s): GLUCAP in the last 168 hours. Lipid Profile: No results for input(s): CHOL, HDL, LDLCALC, TRIG, CHOLHDL, LDLDIRECT in the last 72 hours. Thyroid Function Tests: Recent Labs    03/01/24 1249  TSH 1.034   Anemia Panel: No results for input(s): VITAMINB12, FOLATE, FERRITIN, TIBC, IRON, RETICCTPCT in the last 72 hours. Urine analysis:    Component Value Date/Time   COLORURINE STRAW (A) 03/01/2024 1249   APPEARANCEUR CLEAR (A) 03/01/2024 1249   APPEARANCEUR Clear 06/04/2014 1617   LABSPEC 1.003 (L) 03/01/2024 1249   LABSPEC 1.008 06/04/2014 1617   PHURINE 7.0 03/01/2024 1249   GLUCOSEU NEGATIVE 03/01/2024 1249   GLUCOSEU Negative 06/04/2014 1617   HGBUR NEGATIVE 03/01/2024 1249   BILIRUBINUR NEGATIVE 03/01/2024 1249   BILIRUBINUR Negative 06/04/2014 1617   KETONESUR NEGATIVE 03/01/2024 1249   PROTEINUR NEGATIVE 03/01/2024 1249   NITRITE NEGATIVE 03/01/2024 1249   LEUKOCYTESUR NEGATIVE 03/01/2024 1249   LEUKOCYTESUR Negative 06/04/2014 1617    Radiological Exams on Admission: I have personally reviewed images CT Head Wo Contrast Result Date:  03/01/2024 EXAM: CT HEAD WITHOUT CONTRAST 03/01/2024 01:15:42 PM TECHNIQUE: CT of the head was performed without the administration of intravenous contrast. Automated exposure control, iterative reconstruction, and/or weight based adjustment of the mA/kV was utilized to reduce the radiation dose to as low as reasonably achievable. COMPARISON: 08/28/2023 CLINICAL HISTORY: Confusion. Pt to ED BIB [facility] with c/o elevated blood pressure and episode of confusion this morning. Pt noted to be hypertensive this morning over 200 systolic. Pt reports she took her medications this morning. Pt reports she woke up feeling ; unclear. Denies feeling any numbness or tingling or difficulty with speaking. Reports hx of similar event a few months ago when she was under a lot of stress. Pt remains significantly hypertensive despite taking all blood pressure medications FINDINGS: BRAIN AND VENTRICLES: No acute hemorrhage. No evidence of acute infarct. No hydrocephalus. No extra-axial collection. No mass effect or midline shift. Age related cerebral atrophy. ORBITS: No acute abnormality. SINUSES: No acute abnormality. SOFT TISSUES AND SKULL: No acute soft tissue abnormality. No skull fracture. IMPRESSION: 1. No acute intracranial abnormality. Electronically signed by: Ryan Chess MD 03/01/2024 01:32 PM EDT RP Workstation: [id number]     Assessment/Plan Principal Problem:   TIA (transient ischemic attack)    #TIA vs Hypertensive Encephalopathy - MRI - ECHO - Continue home ASA 81 mg daily  - Check LDL - A1c drawn earlier today at [place] clinic 5.9% - q4H neuro checks - STAT head CT for any change in neuro exam - Monitor on telemetry  - PT/OT/SLP - Resume home hydralazine , clonidine , propranolol , lisinopril   #Hypertension - Resume home hydralazine , clonidine , propranolol , lisinopril   #Hyperlipidemia Not on any outpatient medications - Check lipid panel  #Polycythemia - Follows with  hematology outpatient - Appears to get every 8 weeks therapeutic phlebotomy if hematocrit greater than 50 - With HCT 54 will order therapeutic phlebotomy now  #Chronic kidney disease stage III Stable -Avoid nephrotoxins, contrast Dyes, Hypotension and Dehydration   #Bilateral Lower Extremity Swelling Chronic - TED hose  VTE prophylaxis:  Lovenox   GI prophylaxis: Not indicated Access: PIV Lines: NONE Code  Status:  Full Code  Admission status: Observation, Med-Surg Patient is from: Home  Anticipated d/c is to: Home  Anticipated d/c date is: 1-2 days  Family Communication: Daughter Sofia called at [phone]. No answer. HIPAA compliant voicemail left   Consults called: N/A   Severity of Illness: The appropriate patient status for this patient is OBSERVATION. Observation status is judged to be reasonable and necessary in order to provide the required intensity of service to ensure the patient's safety. The patient's presenting symptoms, physical exam findings, and initial radiographic and laboratory data in the context of their medical condition is felt to place them at decreased risk for further clinical deterioration. Furthermore, it is anticipated that the patient will be medically stable for discharge from the hospital within 2 midnights of admission.   To reach the provider On-Call:   7AM- 7PM see care teams to locate the attending and reach out to them via [email or web address] Password: [email or web address] 7PM-7AM contact night-coverage If you still have difficulty reaching the appropriate provider, please page the [facility] (Director on Call) for [facility] on amion for assistance  This document was prepared using Conservation officer, historic buildings and may include unintentional dictation errors.  Rockie Rams FNP-BC, PMHNP-BC Nurse Practitioner [facility]

## 2024-03-01 NOTE — ED Triage Notes (Addendum)
 Pt to ED BIB [facility] with c/o elevated blood pressure and episode of confusion this morning. Pt noted to be hypertensive this morning over 200 systolic. Pt reports she took her medications this morning. Pt reports she woke up feeling unclear. Denies feeling any numbness or tingling or difficulty with speaking. Reports hx of similar event a few months ago when she was under a lot of stress. Pt remains significantly hypertensive despite taking all blood pressure medications

## 2024-03-01 NOTE — ED Notes (Addendum)
 Pt not agreeable to lab draw at this time. Attempting to contact PCP without success and will not agree to treatment until she can contact him or his nurse. Dr. Nicholaus at bedside attempting to explain medical necessity. Pt also states that she already had labs drawn this morning so cannot draw again. Dr. Nicholaus requests contacting difficult IV team

## 2024-03-01 NOTE — Consult Note (Signed)
 PHARMACY - ANTICOAGULATION CONSULT NOTE  Pharmacy Consult for enoxaparin  Indication: VTE prophylaxis  Allergies  Allergen Reactions   Gabapentin    Cephalexin Rash    Patient Measurements: Height: 5' 2 (157.5 cm) Weight: 64.9 kg (143 lb) IBW/kg (Calculated) : 50.1 HEPARIN DW (KG): 63.3  Vital Signs: Temp: 98.6 F (37 C) (10/24 1610) Temp Source: Oral (10/24 1157) BP: 175/87 (10/24 1610) Pulse Rate: 67 (10/24 1610)  Labs: Recent Labs    03/01/24 1249 03/01/24 1706  HGB 17.5*  --   HCT 54.2*  --   PLT 223  --   CREATININE 0.67  --   TROPONINIHS 6 7    Estimated Creatinine Clearance: 47.9 mL/min (by C-G formula based on SCr of 0.67 mg/dL).   Medical History: Past Medical History:  Diagnosis Date   Breast cancer (HCC) 1999   right breast ca   Erythrocytosis 09/30/2014   Hypertension    Lipoma     Medications:  No home anticoagulants per pharmacist review  Assessment: 83 yo female presented to the ED with complaint of elevated blood pressure and confusion.  PMH includes HTN, HLD, CKD, breast cancer, and migraines.   Goal of Therapy:  Monitor platelets by anticoagulation protocol: Yes   Plan:  Start enoxaparin  40 mg SubQ every 24 hours Check CBC at least every 72 hours while admitted  Kayla JULIANNA Blew, PharmD 03/01/2024,5:59 PM

## 2024-03-01 NOTE — ED Provider Notes (Signed)
 [facility] Provider Note    Event Date/Time   First MD Initiated Contact with Patient 03/01/24 1151     (approximate)   History   No chief complaint on file.   HPI  This is a 83 year old female with a past medical history of stage III CKD, prior breast malignancy status post mastectomy, hypertension on multiple agents, hyperlipidemia, polycythemia, prediabetes, documented high risk medication use who presents to our emergency department via EMS with a resolving episode of confusion that initiated today elevated blood pressures and feeling that something is off.  Patient states that she went to bed normal yesterday evening and woke up at 6 AM feeling very confused and having difficulty verbalizing.  She lives at home alone in a assisted living unit and no one witnessed this.  She states that she drove herself to [place] [facility] today for scheduled blood work after taking her morning medications but had difficulty doing such and made several wrong turns despite going to the same clinic for years and knowing how to get there.  She did proceed with her scheduled blood draw today but endorsed her symptoms to nursing and they were concerned about some confusion as well and sent her to our clinic via EMS.  Despite taking her blood pressure medication as scheduled including her afternoon dose of hydralazine , her blood pressure remains extremely elevated via EMS.  Patient does report that her confusion is largely resolved but still has a headache.  Denies any hearing or vision changes, sensation changes, extremity weakness, chest pain or shortness of breath.  Patient does have a similar episode in April of this year; however patient reports this episode is worse, lasted longer.  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    Of note, patient is very hesitant to obtain repeat blood work.  She states that she just  had blood work done this morning.  Declines and IV.  Request that I reach out to her primary care physician immediately    Physical Exam   Triage Vital Signs: ED Triage Vitals [03/01/24 1157]  Encounter Vitals Group     BP (!) 205/95     Girls Systolic BP Percentile      Girls Diastolic BP Percentile      Boys Systolic BP Percentile      Boys Diastolic BP Percentile      Pulse Rate 64     Resp 16     Temp 98.4 F (36.9 C)     Temp Source Oral     SpO2 97 %     Weight      Height      Head Circumference      Peak Flow      Pain Score      Pain Loc      Pain Education      Exclude from Growth Chart     Most recent vital signs: Vitals:   03/01/24 1500 03/01/24 1610  BP:  (!) 175/87  Pulse: 68 67  Resp: 16   Temp:  98.6 F (37 C)  SpO2: 97% 97%    Nursing Triage Note reviewed. Vital signs reviewed and patients oxygen saturation is normoxic, blood pressure elevated  General: Patient is well nourished, well developed, awake and alert, Head: Normocephalic and atraumatic Eyes: Normal inspection, extraocular muscles intact, no conjunctival pallor Ear, nose, throat: Normal external exam Neck: Normal range of motion  Respiratory: Patient is in no respiratory distress, lungs CTAB Cardiovascular: Patient is not tachycardic, RRR without murmur appreciated GI: Abd SNT with no guarding or rebound  Back: Normal inspection of the back with good strength and range of motion throughout all ext Extremities: pulses intact with good cap refills, no LE pitting edema or calf tenderness Neuro: The patient is alert and oriented to person, place, and time, but easily confused on occasion stating that she went to obtain blood work yesterday as opposed to today with 5/5 bilat UE/LE strength, no gross motor or sensory defects noted. Coordination appears to be adequate.  Cranials 2 through 12 intact bilaterally Skin: Warm, dry, and intact Psych: normal mood and affect, no SI or HI  ED Results  / Procedures / Treatments   Labs (all labs ordered are listed, but only abnormal results are displayed) Labs Reviewed  CBC WITH DIFFERENTIAL/PLATELET - Abnormal; Notable for the following components:      Result Value   RBC 5.89 (*)    Hemoglobin 17.5 (*)    HCT 54.2 (*)    All other components within normal limits  COMPREHENSIVE METABOLIC PANEL WITH GFR - Abnormal; Notable for the following components:   Glucose, Bld 104 (*)    All other components within normal limits  URINALYSIS, COMPLETE (UACMP) WITH MICROSCOPIC - Abnormal; Notable for the following components:   Color, Urine STRAW (*)    APPearance CLEAR (*)    Specific Gravity, Urine 1.003 (*)    All other components within normal limits  BRAIN NATRIURETIC PEPTIDE - Abnormal; Notable for the following components:   B Natriuretic Peptide 252.1 (*)    All other components within normal limits  TSH  BLOOD GAS, VENOUS  CBC  BASIC METABOLIC PANEL WITH GFR  LIPID PANEL  TROPONIN I (HIGH SENSITIVITY)  TROPONIN I (HIGH SENSITIVITY)     EKG EKG and rhythm strip are interpreted by myself:   EKG: [tachcyardic sinus rhythm] at heart rate of 102, normal QRS duration, QTc 413,nonspecific ST segments and T waves no ectopy EKG not consistent with Acute STEMI Rhythm strip: tachcyardic in lead II   RADIOLOGY CT head without contast: No intracranial hemorrhage on my independent review interpretation radiologist agrees    PROCEDURES:  Critical Care performed: No  Procedures   MEDICATIONS ORDERED IN ED: Medications  acetaminophen  (TYLENOL ) tablet 650 mg (650 mg Oral Given 03/01/24 1753)    Or  acetaminophen  (TYLENOL ) suppository 650 mg ( Rectal See Alternative 03/01/24 1753)  ondansetron  (ZOFRAN ) tablet 4 mg (has no administration in time range)    Or  ondansetron  (ZOFRAN ) injection 4 mg (has no administration in time range)  lisinopril  (ZESTRIL ) tablet 20 mg (has no administration in time range)  propranolol  ER (INDERAL   LA) 24 hr capsule 80 mg (has no administration in time range)  cloNIDine  (CATAPRES ) tablet 0.1 mg (has no administration in time range)  hydrALAZINE  (APRESOLINE ) tablet 50 mg (50 mg Oral Given 03/01/24 1726)  enoxaparin  (LOVENOX ) injection 40 mg (has no administration in time range)  aspirin  EC tablet 81 mg (has no administration in time range)  cloNIDine  (CATAPRES ) tablet 0.1 mg (0.1 mg Oral Given 03/01/24 1439)  hydrALAZINE  (APRESOLINE ) tablet 50 mg (50 mg Oral Given 03/01/24 1439)     IMPRESSION / MDM / ASSESSMENT AND PLAN / ED COURSE  Differential diagnosis includes, but is not limited to, TIA/CVA, hypertensive urgency, intracranial hemorrhage, acute renal insufficiency, heart strain, electrolyte derangement anemia  ED course: Patient presents and does not have any acute focal neurological deficits on presentation however does not answer every question correctly intermittently.  I still believe that she retains capacity to make decisions for herself at this time.  Given her episode of confusion headache and feeling that something is off, I reviewed the indications benefits risks and alternatives of needing a TIA/CVA workup today and she voiced understanding and was able to repeat this back to me but unfortunately patient is very hesitant to proceed with any interventions until I talk to her primary care physician Dr. Auston.  I did reach out to his clinic and I attempted to call her emergency contact with no responses.  Patient did reach out to her primary care physician team independently and is expecting a callback from her PCP.  The difficult IV team was contacted and patient was very hesitant to proceed and was only amenable for a butterfly stick and we will obtain blood work x-ray.  CT head is pending but I have counseled the patient that given that she is on multiple blood pressure agents already and her episode of confusion that seems mainly resolved I would  be hesitant to discharge her today given her risk factors.  Of note, the patient is on propranolol , clonidine , lisinopril  hydralazine .    Clinical Course as of 03/01/24 1834  Fri Mar 01, 2024  1216 Of note, I have counseled the patient on multiple occasions and need for blood [place] and a workup today.  She is highly upset that I was unable to get in touch immediately with Dr. Auston of which I have sent a Epic chart message.  On review of the blood work that she was drawn earlier today, I see CBC, hemoglobin A1c and that is it.  [HD]  1219 Attempted to call patients emergency contact, Daughter: Sofia Applebee. [phone]. No answer. Message left with callback information   [HD]  1304 Urinalysis, Complete w Microscopic -Urine, Clean Catch(!) Not c/w infection [HD]  1341 HCT(!): 54.2 Polycythemic [HD]  1403 B Natriuretic Peptide(!): 252.1 [HD]  1403 Some evidence of strain [HD]  1406 Troponin I (High Sensitivity): 6 Not elevated [HD]  1427 Talked to hospitalist on-call Dr. Vannie.  Explained the need for admission including need for TIA CVA workup, that she is certainly out of the window for any acute stroke alert and symptoms have resolved (NIHSS 0 currently), need for further blood pressure monitoring and phlebotomy.  He voiced understanding and requested I put in the MRI which I have done.  I counseled him that I do not think this should hold up her admission as she will need admission either way he voiced understanding and agreement [HD]    Clinical Course User Index [HD] Nicholaus Rolland BRAVO, MD   -- Risk: 5 This patient has a high risk of morbidity due to further diagnostic testing or treatment. Rationale: This patient's evaluation and management involve a high risk of morbidity due to the potential severity of presenting symptoms, need for diagnostic testing, and/or initiation of treatment that may require close monitoring. The differential includes conditions with potential for significant  deterioration or requiring escalation of care. Treatment decisions in the ED, including medication administration, procedural interventions, or disposition planning, reflect this level of risk. COPA: 5 The patient has the following acute or chronic illness/injury that poses a possible threat  to life or bodily function: [X] : The patient has a potentially serious acute condition or an acute exacerbation of a chronic illness requiring urgent evaluation and management in the Emergency Department. The clinical presentation necessitates immediate consideration of life-threatening or function-threatening diagnoses, even if they are ultimately ruled out.   FINAL CLINICAL IMPRESSION(S) / ED DIAGNOSES   Final diagnoses:  Transient confusion  Secondary hypertension  Polycythemia     Rx / DC Orders   ED Discharge Orders     None        Note:  This document was prepared using Dragon voice recognition software and may include unintentional dictation errors.   Nicholaus Rolland BRAVO, MD 03/01/24 [phone]

## 2024-03-02 ENCOUNTER — Other Ambulatory Visit: Payer: Self-pay

## 2024-03-02 DIAGNOSIS — R7303 Prediabetes: Secondary | ICD-10-CM | POA: Diagnosis not present

## 2024-03-02 DIAGNOSIS — D751 Secondary polycythemia: Secondary | ICD-10-CM | POA: Diagnosis not present

## 2024-03-02 DIAGNOSIS — E785 Hyperlipidemia, unspecified: Secondary | ICD-10-CM | POA: Diagnosis not present

## 2024-03-02 DIAGNOSIS — I674 Hypertensive encephalopathy: Secondary | ICD-10-CM | POA: Diagnosis not present

## 2024-03-02 DIAGNOSIS — R519 Headache, unspecified: Secondary | ICD-10-CM | POA: Insufficient documentation

## 2024-03-02 LAB — CBC
HCT: 47 % — ABNORMAL HIGH (ref 36.0–46.0)
Hemoglobin: 15.6 g/dL — ABNORMAL HIGH (ref 12.0–15.0)
MCH: 29.5 pg (ref 26.0–34.0)
MCHC: 33.2 g/dL (ref 30.0–36.0)
MCV: 89 fL (ref 80.0–100.0)
Platelets: 201 K/uL (ref 150–400)
RBC: 5.28 MIL/uL — ABNORMAL HIGH (ref 3.87–5.11)
RDW: 13.7 % (ref 11.5–15.5)
WBC: 7.1 K/uL (ref 4.0–10.5)
nRBC: 0 % (ref 0.0–0.2)

## 2024-03-02 LAB — BASIC METABOLIC PANEL WITH GFR
Anion gap: 9 (ref 5–15)
BUN: 17 mg/dL (ref 8–23)
CO2: 24 mmol/L (ref 22–32)
Calcium: 8.9 mg/dL (ref 8.9–10.3)
Chloride: 103 mmol/L (ref 98–111)
Creatinine, Ser: 0.72 mg/dL (ref 0.44–1.00)
GFR, Estimated: >60 mL/min (ref >60)
Glucose, Bld: 109 mg/dL — ABNORMAL HIGH (ref 70–99)
Potassium: 3.4 mmol/L — ABNORMAL LOW (ref 3.5–5.1)
Sodium: 136 mmol/L (ref 135–145)

## 2024-03-02 LAB — LIPID PANEL
Cholesterol: 191 mg/dL (ref 0–200)
HDL: 51 mg/dL (ref >40)
LDL Cholesterol: 112 mg/dL — ABNORMAL HIGH (ref 0–99)
Total CHOL/HDL Ratio: 3.7 ratio
Triglycerides: 140 mg/dL (ref <150)
VLDL: 28 mg/dL (ref 0–40)

## 2024-03-02 MED ORDER — POTASSIUM CHLORIDE CRYS ER 20 MEQ PO TBCR
20.0000 meq | EXTENDED_RELEASE_TABLET | Freq: Once | ORAL | Status: DC
  Filled 2024-03-02: qty 1

## 2024-03-02 NOTE — Discharge Planning (Signed)
-----------------------------------------------------------  [facility] D(Data) A(Action) R(response)     Data: Patient set to discharge today.    Action: Messaged Primary RN Laurel to see if patient needed any other assistance with DC. Laurel RN stated she just needed ECHO. Expeditor reviewed orders to reach out to [facility]  to see if ECHO could be done as soon as possible to assist with DC.     Response: Expeditor reviewed order history, ECHO discontinued by Dr Gerhard. Primary RN Bridgette updated via [employer]. Laurel assisting patient in discharge process.      ALEC Lim, RN [facility]

## 2024-03-02 NOTE — Assessment & Plan Note (Signed)
LDL 112

## 2024-03-02 NOTE — Discharge Instructions (Signed)
 Can take extra clonidine  0.1 mg for high BP above 160/90.  If bp consistantely elevated can take clonidine  three times a day.

## 2024-03-02 NOTE — Assessment & Plan Note (Addendum)
 Unfortunately unable to do therapeutic phlebotomy over the weekend at the hospital.  Case discussed with Dr. Jacobo and he will have the office call her on Monday morning to set up phlebotomy draw.  Hemoglobin down to 15.6 and hematocrit 47.

## 2024-03-02 NOTE — Progress Notes (Signed)
 Attempted to administer potassium supplement but patient refused.  States she is being discharged and will eat her bananas when she gets home.  MD made aware.

## 2024-03-02 NOTE — Discharge Summary (Signed)
 Physician Discharge Summary   Patient: Michele Andrade MRN: [id number] DOB: Sep 19, 1940  Admit date:     03/01/2024  Discharge date: 03/02/24  Discharge Physician: Charlie Patterson   PCP: Auston Reyes BIRCH, MD   Recommendations at discharge:   The cancer center will call you for appointment to have phlebotomy Follow-up PCP 5 days  Discharge Diagnoses: Active Problems:   Hypertensive encephalopathy   Polycythemia   HLD (hyperlipidemia)   Prediabetes   Headache    Hospital Course: 83 y.o. year old female with past medical history of hypertension, hyperlipidemia, migraines, polycythemia, chronic kidney disease, prediabetes, previous breast cancer s/p mastectomy.  She presents to [facility] regional ED after an episode of confusion this morning that began this morning and was resolving on arrival to the ED.  Patient reports she went to sleep last evening feeling normal and she woke up this morning around 6 AM feeling confused and having difficulty verbalizing.  She lives at home alone and unfortunately this was not witnessed by anyone else.  This morning she drove herself to continue to clinic for scheduled blood work after taking her regular medications this morning but reports she had difficulty getting there as she made several wrong turns despite having gone to the same clinic for many years and being familiar with how to get there.  Once at cardiology clinic she shared her symptoms with the nursing staff who sent her to the ED.  She endorses headache and some leg swelling.  She denies dizziness, slurred speech, weakness, vision changes, or change in sensorium.   ED Course: On arrival to [facility] regional ED patient was noted to be afebrile temp 36.9 C, BP 205/95, HR 64, RR 16, SpO2 97% on room air.  CT head and MRI obtained and both are negative for acute intracranial abnormality.  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  Labs notable for hemoglobin 17.5 and hematocrit 54.2, BNP  252.1.  Hemoglobin A1c drawn this morning at [facility] clinic 5.9%. TRH contacted for admission.  10/24 MRI of the brain negative for stroke.  Unfortunately unable to set up therapeutic phlebotomy for patient.  Lab does not do this.  I called special procedures but they have left for the day.  Spoke with Dr. Jacobo oncology and he will have his office contact the patient for phlebotomy on Monday.  10/25.  Patient feeling better and wants to go home.  Hemoglobin 15.6 and hematocrit 47.  Blood pressure trending better.   Assessment and Plan: Hypertensive encephalopathy Blood pressure elevated upon coming into the hospital.  Patient given a couple extra doses of clonidine  and hydralazine .  Blood pressure trended better.  If blood pressure remains high as outpatient can go up to 3 times a day on the clonidine  or take an extra dose as needed.  Patient feeling better and wants to go home.  TIA ruled out.  MRI of the brain negative for stroke  Polycythemia Unfortunately unable to do therapeutic phlebotomy over the weekend at the hospital.  Case discussed with Dr. Jacobo and he will have the office call her on Monday morning to set up phlebotomy draw.  Hemoglobin down to 15.6 and hematocrit 47.  Headache Secondary to high blood pressure  Prediabetes Outpatient hemoglobin A1c 5.9  HLD (hyperlipidemia) LDL 112         Consultants: Case discussed with oncology Procedures performed: None Disposition: Home Diet recommendation:  Cardiac and Carb modified diet DISCHARGE MEDICATION: Allergies as of 03/02/2024  Reactions   Gabapentin    Cephalexin Rash        Medication List     TAKE these medications    Alpha-Lipoic Acid 600 MG Caps Take 600 mg by mouth daily.   aspirin  EC 81 MG tablet Take 81 mg by mouth daily.   Calcium  Carbonate-Vitamin D  600-400 MG-UNIT tablet Take 1 tablet by mouth 2 (two) times daily.   cloNIDine  0.1 MG tablet Commonly known as: CATAPRES  Take  1 tablet by mouth 2 (two) times daily.   Co Q-10 100 MG Caps Take 100 mg by mouth every morning.   Fish Oil 1000 MG Caps Take 1,000 mg by mouth 2 (two) times daily.   hydrALAZINE  50 MG tablet Commonly known as: APRESOLINE  Take 1 tablet by mouth 3 (three) times daily.   lisinopril  20 MG tablet Commonly known as: ZESTRIL  Take 20 mg by mouth daily.   MACULAR HEALTH FORMULA PO Take 1 tablet by mouth daily.   MAGNESIUM GLYCINATE PLUS PO Take 200 mg by mouth every evening.   Multi-Vitamins Tabs Take 1 tablet by mouth 2 (two) times daily.   propranolol  ER 80 MG 24 hr capsule Commonly known as: INDERAL  LA Take 80 mg by mouth every morning.   TURMERIC PO Take 1 tablet by mouth 2 (two) times daily.        Follow-up Information     Auston Reyes BIRCH, MD Follow up in 5 day(s).   Specialty: Internal Medicine Contact information: [address] [place] [address] [phone]         Rennie Cindy SAUNDERS, MD Follow up.   Specialties: Internal Medicine, Oncology Why: office will call you on monday to set up phlebotomy. Contact information: [address] [place] [address] [phone]                Discharge Exam: Fredricka Weights   03/01/24 1704  Weight: 64.9 kg   Physical Exam HENT:     Head: Normocephalic.  Eyes:     General: Lids are normal.     Conjunctiva/sclera: Conjunctivae normal.  Cardiovascular:     Rate and Rhythm: Normal rate and regular rhythm.     Heart sounds: Normal heart sounds, S1 normal and S2 normal.  Pulmonary:     Breath sounds: No decreased breath sounds, wheezing, rhonchi or rales.  Abdominal:     Palpations: Abdomen is soft.     Tenderness: There is no abdominal tenderness.  Musculoskeletal:     Right lower leg: No swelling.     Left lower leg: No swelling.  Skin:    General: Skin is warm.     Findings: No rash.  Neurological:     Mental Status: She is alert and oriented to  person, place, and time.      Condition at discharge: stable  The results of significant diagnostics from this hospitalization (including imaging, microbiology, ancillary and laboratory) are listed below for reference.   Imaging Studies: MR BRAIN WO CONTRAST Result Date: 03/01/2024 EXAM: MRI BRAIN WITHOUT CONTRAST 03/01/2024 03:40:34 PM TECHNIQUE: Multiplanar multisequence MRI of the head/brain was performed without the administration of intravenous contrast. COMPARISON: Head CT 03/01/2024 and trigeminal protocol MRI 12/22/2023. CLINICAL HISTORY: Concern for subacute CVA, transient hypotension. Elevated blood pressure and episode of confusion this morning. FINDINGS: BRAIN AND VENTRICLES: There is no evidence of an acute infarct, intracranial hemorrhage, mass, midline shift, hydrocephalus, or extra-axial fluid collection. Scattered small T2 hyperintensities in the cerebral white  matter are nonspecific but compatible with mild chronic small vessel ischemic disease. 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 There is mild cerebral atrophy. Hypoplastic, poorly visualized distal right vertebral artery. Other major intracranial arterial flow voids are preserved. ORBITS: No acute abnormality. SINUSES AND MASTOIDS: No acute abnormality. BONES AND SOFT TISSUES: Normal marrow signal. No acute soft tissue abnormality. Advanced cervical facet arthrosis. IMPRESSION: 1. No acute intracranial abnormality. 2. Mild chronic small vessel ischemic disease. Electronically signed by: Dasie Hamburg MD 03/01/2024 04:29 PM EDT RP Workstation: [id number]   CT Head Wo Contrast Result Date: 03/01/2024 EXAM: CT HEAD WITHOUT CONTRAST 03/01/2024 01:15:42 PM TECHNIQUE: CT of the head was performed without the administration of intravenous contrast. Automated exposure control, iterative reconstruction, and/or weight based adjustment of the mA/kV was utilized to reduce the radiation dose to as low as  reasonably achievable. COMPARISON: 08/28/2023 CLINICAL HISTORY: Confusion. Pt to ED BIB [facility] with c/o elevated blood pressure and episode of confusion this morning. Pt noted to be hypertensive this morning over 200 systolic. Pt reports she took her medications this morning. Pt reports she woke up feeling ; unclear. Denies feeling any numbness or tingling or difficulty with speaking. Reports hx of similar event a few months ago when she was under a lot of stress. Pt remains significantly hypertensive despite taking all blood pressure medications FINDINGS: BRAIN AND VENTRICLES: No acute hemorrhage. No evidence of acute infarct. No hydrocephalus. No extra-axial collection. No mass effect or midline shift. Age related cerebral atrophy. ORBITS: No acute abnormality. SINUSES: No acute abnormality. SOFT TISSUES AND SKULL: No acute soft tissue abnormality. No skull fracture. IMPRESSION: 1. No acute intracranial abnormality. Electronically signed by: Ryan Chess MD 03/01/2024 01:32 PM EDT RP Workstation: [id number]    Microbiology: Results for orders placed or performed during the hospital encounter of 08/28/23  Resp panel by RT-PCR (RSV, Flu A&B, Covid) Anterior Nasal Swab     Status: None   Collection Time: 08/28/23  7:25 PM   Specimen: Anterior Nasal Swab  Result Value Ref Range Status   SARS Coronavirus 2 by RT PCR NEGATIVE NEGATIVE Final    Comment: (NOTE) SARS-CoV-2 target nucleic acids are NOT DETECTED.  The SARS-CoV-2 RNA is generally detectable in upper respiratory specimens during the acute phase of infection. The lowest concentration of SARS-CoV-2 viral copies this assay can detect is 138 copies/mL. A negative result does not preclude SARS-Cov-2 infection and should not be used as the sole basis for treatment or other patient management decisions. A negative result may occur with  improper specimen collection/handling, submission of specimen other than nasopharyngeal swab,  presence of viral mutation(s) within the areas targeted by this assay, and inadequate number of viral copies(<138 copies/mL). A negative result must be combined with clinical observations, patient history, and epidemiological information. The expected result is Negative.  Fact Sheet for Patients:  bloggercourse.com  Fact Sheet for Healthcare Providers:  seriousbroker.it  This test is no t yet approved or cleared by the [place]  FDA and  has been authorized for detection and/or diagnosis of SARS-CoV-2 by FDA under an Emergency Use Authorization (EUA). This EUA will remain  in effect (meaning this test can be used) for the duration of the COVID-19 declaration under Section 564(b)(1) of the Act, 21 U.S.C.section 360bbb-3(b)(1), unless the authorization is terminated  or revoked sooner.       Influenza A by PCR NEGATIVE NEGATIVE Final   Influenza B by PCR NEGATIVE NEGATIVE Final  Comment: (NOTE) The Xpert Xpress SARS-CoV-2/FLU/RSV plus assay is intended as an aid in the diagnosis of influenza from Nasopharyngeal swab specimens and should not be used as a sole basis for treatment. Nasal washings and aspirates are unacceptable for Xpert Xpress SARS-CoV-2/FLU/RSV testing.  Fact Sheet for Patients: bloggercourse.com  Fact Sheet for Healthcare Providers: seriousbroker.it  This test is not yet approved or cleared by the [place]  FDA and has been authorized for detection and/or diagnosis of SARS-CoV-2 by FDA under an Emergency Use Authorization (EUA). This EUA will remain in effect (meaning this test can be used) for the duration of the COVID-19 declaration under Section 564(b)(1) of the Act, 21 U.S.C. section 360bbb-3(b)(1), unless the authorization is terminated or revoked.     Resp Syncytial Virus by PCR NEGATIVE NEGATIVE Final    Comment: (NOTE) Fact Sheet for  Patients: bloggercourse.com  Fact Sheet for Healthcare Providers: seriousbroker.it  This test is not yet approved or cleared by the [place]  FDA and has been authorized for detection and/or diagnosis of SARS-CoV-2 by FDA under an Emergency Use Authorization (EUA). This EUA will remain in effect (meaning this test can be used) for the duration of the COVID-19 declaration under Section 564(b)(1) of the Act, 21 U.S.C. section 360bbb-3(b)(1), unless the authorization is terminated or revoked.  Performed at [facility], [address] [place], [place] [address]     Labs: CBC: Recent Labs  Lab 03/01/24 1249 03/02/24 0550  WBC 9.1 7.1  NEUTROABS 7.7  --   HGB 17.5* 15.6*  HCT 54.2* 47.0*  MCV 92.0 89.0  PLT 223 201   Basic Metabolic Panel: Recent Labs  Lab 03/01/24 1249 03/02/24 0550  NA 141 136  K 3.7 3.4*  CL 105 103  CO2 23 24  GLUCOSE 104* 109*  BUN 19 17  CREATININE 0.67 0.72  CALCIUM  9.7 8.9   Liver Function Tests: Recent Labs  Lab 03/01/24 1249  AST 21  ALT 18  ALKPHOS 51  BILITOT 1.0  PROT 7.5  ALBUMIN 4.2   CBG: No results for input(s): GLUCAP in the last 168 hours.  Discharge time spent: greater than 30 minutes.  Signed: Charlie Patterson, MD [facility] 03/02/2024

## 2024-03-02 NOTE — Hospital Course (Signed)
 83 y.o. year old female with past medical history of hypertension, hyperlipidemia, migraines, polycythemia, chronic kidney disease, prediabetes, previous breast cancer s/p mastectomy.  She presents to [facility] regional ED after an episode of confusion this morning that began this morning and was resolving on arrival to the ED.  Patient reports she went to sleep last evening feeling normal and she woke up this morning around 6 AM feeling confused and having difficulty verbalizing.  She lives at home alone and unfortunately this was not witnessed by anyone else.  This morning she drove herself to continue to clinic for scheduled blood work after taking her regular medications this morning but reports she had difficulty getting there as she made several wrong turns despite having gone to the same clinic for many years and being familiar with how to get there.  Once at cardiology clinic she shared her symptoms with the nursing staff who sent her to the ED.  She endorses headache and some leg swelling.  She denies dizziness, slurred speech, weakness, vision changes, or change in sensorium.   ED Course: On arrival to [facility] regional ED patient was noted to be afebrile temp 36.9 C, BP 205/95, HR 64, RR 16, SpO2 97% on room air.  CT head and MRI obtained and both are negative for acute intracranial abnormality.  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  Labs notable for hemoglobin 17.5 and hematocrit 54.2, BNP 252.1.  Hemoglobin A1c drawn this morning at [facility] clinic 5.9%. TRH contacted for admission.  10/24 MRI of the brain negative for stroke.  Unfortunately unable to set up therapeutic phlebotomy for patient.  Lab does not do this.  I called special procedures but they have left for the day.  Spoke with Dr. Jacobo oncology and he will have his office contact the patient for phlebotomy on Monday.  10/25.  Patient feeling better and wants to go home.  Hemoglobin 15.6 and hematocrit 47.  Blood  pressure trending better.

## 2024-03-02 NOTE — Assessment & Plan Note (Signed)
 Secondary to high blood pressure

## 2024-03-02 NOTE — Evaluation (Signed)
 Physical Therapy Evaluation Patient Details Name: Michele Andrade MRN: [id number] DOB: 1940-10-19 Today's Date: 03/02/2024  History of Present Illness  Pt is an 83 y/o F presenting to ED with c/o confusion, elevated blood pressure. Imaging negative for acute intracranial abnormality. MD assessment includes TIA vs hypertensive encephalopathy. PMH significant for HTN, HLD, migraines, polycythemia, CKD, prediabetes, previous breast cancer s/p mastectomy.   Clinical Impression  Pt A&Ox4, pleasant and agreeable to PT evaluation. At baseline, pt is IND with mobility/ADLs, denies hx of falls. Pt was received in bed, modI for bed mobility this date and IND with STS transfers. Pt able to amb ~371ft with no AD, minimal gait deficits observed such as decreased bilateral arm swing and very narrow BOS throughout, no LOB. Pt was left semi-supine in bed at end of session with all needs in reach. Pt appears to be at her baseline level of function, no acute PT needs identified at this time. PT to sign-off, can be re-consulted if acute need arises.        If plan is discharge home, recommend the following: Assist for transportation   Can travel by private vehicle        Equipment Recommendations None recommended by PT  Recommendations for Other Services       Functional Status Assessment Patient has had a recent decline in their functional status and demonstrates the ability to make significant improvements in function in a reasonable and predictable amount of time.     Precautions / Restrictions Precautions Precautions: Fall Recall of Precautions/Restrictions: Intact Restrictions Weight Bearing Restrictions Per Provider Order: No      Mobility  Bed Mobility Overal bed mobility: Modified Independent             General bed mobility comments: no physical assistance required    Transfers Overall transfer level: Independent Equipment used: None Transfers: Sit to/from Stand Sit to Stand:  Supervision           General transfer comment: STS from EOB with supervision, no AD    Ambulation/Gait Ambulation/Gait assistance: Contact guard assist (provided but not required) Gait Distance (Feet): 350 Feet Assistive device: None Gait Pattern/deviations: Step-through pattern, Narrow base of support       General Gait Details: Very narrow BOS throughout, intermittent scissoring with turns, no overt LOB, decreased arm swing bilaterally  Stairs            Wheelchair Mobility     Tilt Bed    Modified Rankin (Stroke Patients Only)       Balance Overall balance assessment: Modified Independent Sitting-balance support: Feet supported Sitting balance-Leahy Scale: Normal Sitting balance - Comments: steady static and dynamic sitting   Standing balance support: No upper extremity supported Standing balance-Leahy Scale: Good Standing balance comment: mild increased postural sway with dynamic tasks                             Pertinent Vitals/Pain Pain Assessment Pain Assessment: No/denies pain    Home Living Family/patient expects to be discharged to:: Private residence Living Arrangements: Alone Available Help at Discharge: Neighbor;Available PRN/intermittently Type of Home: Independent living facility Home Access: Level entry       Home Layout: One level Home Equipment: Agricultural Consultant (2 wheels);Cane - single point;Shower seat;Toilet riser;Grab bars - toilet;Grab bars - tub/shower      Prior Function Prior Level of Function : Independent/Modified Independent;Driving  Mobility Comments: Pt reports being very active prior to admission, community ambulator with no AD, denies hx of falls ADLs Comments: IND with ADLs     Extremity/Trunk Assessment   Upper Extremity Assessment Upper Extremity Assessment: Overall WFL for tasks assessed    Lower Extremity Assessment Lower Extremity Assessment: Overall WFL for tasks assessed        Communication   Communication Communication: No apparent difficulties    Cognition Arousal: Alert Behavior During Therapy: WFL for tasks assessed/performed   PT - Cognitive impairments: No apparent impairments                       PT - Cognition Comments: A&Ox4, pleasant and cooperative throughout Following commands: Intact       Cueing Cueing Techniques: Verbal cues, Visual cues     General Comments      Exercises     Assessment/Plan    PT Assessment Patient does not need any further PT services  PT Problem List         PT Treatment Interventions      PT Goals (Current goals can be found in the Care Plan section)  Acute Rehab PT Goals Patient Stated Goal: to go home PT Goal Formulation: All assessment and education complete, DC therapy    Frequency       Co-evaluation               AM-PAC PT 6 Clicks Mobility  Outcome Measure Help needed turning from your back to your side while in a flat bed without using bedrails?: None Help needed moving from lying on your back to sitting on the side of a flat bed without using bedrails?: None Help needed moving to and from a bed to a chair (including a wheelchair)?: None Help needed standing up from a chair using your arms (e.g., wheelchair or bedside chair)?: None Help needed to walk in hospital room?: None Help needed climbing 3-5 steps with a railing? : A Little 6 Click Score: 23    End of Session Equipment Utilized During Treatment: Gait belt Activity Tolerance: Patient tolerated treatment well Patient left: in bed;with call bell/phone within reach;with bed alarm set Nurse Communication: Mobility status PT Visit Diagnosis: Other abnormalities of gait and mobility (R26.89);Muscle weakness (generalized) (M62.81)    Time: 9051-8990 PT Time Calculation (min) (ACUTE ONLY): 21 min   Charges:   PT Evaluation $PT Eval Low Complexity: 1 Low   PT General Charges $$ ACUTE PT VISIT: 1  Visit         Janell Axe, SPT

## 2024-03-02 NOTE — Progress Notes (Signed)
 OT SCREEN  Patient Details Name: Michele Andrade MRN: [id number] DOB: [birth year]   Upon arrival patient in bed.  Patient back to baseline.  Independent in mobility.  Lives at the [place] [facility] [place] [facility] [address] Bathroom is accessible with 0 entry and shower rails. Very active going to the gym, she runs the book club as well as participate in some other clubs. Bilateral upper extremities within normal limits as well as grip and fine motor. Vision back to baseline No need for OT intervention.   Ancel Peters OTR/L,CLT 03/02/2024, 11:52 AM

## 2024-03-02 NOTE — Plan of Care (Signed)

## 2024-03-02 NOTE — Assessment & Plan Note (Addendum)
 Blood pressure elevated upon coming into the hospital.  Patient given a couple extra doses of clonidine  and hydralazine .  Blood pressure trended better.  If blood pressure remains high as outpatient can go up to 3 times a day on the clonidine  or take an extra dose as needed.  Patient feeling better and wants to go home.  TIA ruled out.  MRI of the brain negative for stroke

## 2024-03-02 NOTE — Assessment & Plan Note (Signed)
 Outpatient hemoglobin A1c 5.9

## 2024-03-04 ENCOUNTER — Telehealth: Payer: Self-pay | Admitting: Internal Medicine

## 2024-03-04 ENCOUNTER — Inpatient Hospital Stay

## 2024-03-04 ENCOUNTER — Inpatient Hospital Stay: Attending: Internal Medicine

## 2024-03-04 VITALS — BP 148/82 | HR 56 | Temp 97.8°F | Resp 18

## 2024-03-04 DIAGNOSIS — Z79899 Other long term (current) drug therapy: Secondary | ICD-10-CM | POA: Diagnosis not present

## 2024-03-04 DIAGNOSIS — Z853 Personal history of malignant neoplasm of breast: Secondary | ICD-10-CM | POA: Diagnosis not present

## 2024-03-04 DIAGNOSIS — Z9011 Acquired absence of right breast and nipple: Secondary | ICD-10-CM | POA: Insufficient documentation

## 2024-03-04 DIAGNOSIS — D751 Secondary polycythemia: Secondary | ICD-10-CM | POA: Diagnosis present

## 2024-03-04 NOTE — Patient Instructions (Signed)

## 2024-03-04 NOTE — Progress Notes (Signed)
 Michele Andrade presents today for phlebotomy per MD orders. Phlebotomy procedure started at 1200 and ended at 1220. 300 mls removed. Patient tolerated procedure well. IV needle removed intact.

## 2024-03-04 NOTE — Telephone Encounter (Signed)
 Pt was here in clinic for phleb. Pt came to desk with fluid clinic RN and canceled appts in Nov and Dec. Pt then scheduled MD only in Nov. And then 3 mo lab / +/- phleb

## 2024-03-05 ENCOUNTER — Inpatient Hospital Stay (HOSPITAL_BASED_OUTPATIENT_CLINIC_OR_DEPARTMENT_OTHER): Admitting: Nurse Practitioner

## 2024-03-05 ENCOUNTER — Encounter: Payer: Self-pay | Admitting: Nurse Practitioner

## 2024-03-05 VITALS — BP 137/73 | HR 72 | Temp 97.3°F | Resp 18 | Ht 62.0 in | Wt 140.7 lb

## 2024-03-05 DIAGNOSIS — I10 Essential (primary) hypertension: Secondary | ICD-10-CM | POA: Diagnosis not present

## 2024-03-05 DIAGNOSIS — D751 Secondary polycythemia: Secondary | ICD-10-CM | POA: Diagnosis not present

## 2024-03-05 NOTE — Progress Notes (Signed)
  [facility] OFFICE PROGRESS NOTE  Patient Care Team: Michele Andrade BIRCH, MD as PCP - General (Internal Medicine) Rennie Cindy SAUNDERS, MD as Medical Oncologist (Hematology and Oncology)   SUMMARY OF ONCOLOGIC HISTORY:  # April 2015- ERTHROCYTOSIS ; JAK-2/Exon- 12NEG; Epo-N; June 2017-  Phlebotomy every 8 weeks  # 1999-RIGHT BREAST- Mastec & ALND [[place]] & chemo; Tam x 5 years   INTERVAL HISTORY: Alone.  Ambulating independently.  A very pleasant 83  year-old female patient with above history of symptomatic erythrocytosis [headaches elevated blood pressure] is here due to concerns and questions regarding a recent hospitalization for hypertension.   She recently presented to the ER with complaints of headache and confusion.  She was found to have a very elevated BP of 205/95 which responded to additional dose of hydralazine  and clonidine .  While in the hospital her Hct was found to be 47, therapeutic phlebotomy was arranged and completed here at clinic yesterday.  I reviewed all hospital notes and labs that were collected in the hospital as well as reviewed them with the patient.  Today she requested an appointment to address several concerns which included at recent elevated BNP level in hospital as well as a low ferritin level from PCP.  During this visit we discuss in detail her labs from the hospital as well as the ferritin level.  We discussed that frequent phlebotomies any cause low ferritin levels which is not a concern at this time.  As far as BNP she does not have a history of CHF and no s/sx of CHF.  This was like slightly elevated in the setting of significant HTN.    Patient reports that she wishes for phlebotomies to be completed every 3 months and possibly moving her HCT goal < 47 and not < 50.  As she is concerned that blood pressure elevations could be from needing more frequent phlebotomies.  We set her up an appointment in Jan. For labs, MD, and possible  phlebotomy.  Patient reports that all questions were answered and was satisfied with visit.       No nausea no vomiting.  Denies any weight loss or night sweats.  Review of Systems  Constitutional:  Positive for malaise/fatigue. Negative for chills, diaphoresis, fever and weight loss.  HENT:  Negative for nosebleeds and sore throat.   Eyes:  Negative for double vision.  Respiratory:  Negative for cough, hemoptysis, sputum production, shortness of breath and wheezing.   Cardiovascular:  Negative for chest pain, palpitations, orthopnea and leg swelling.  Gastrointestinal:  Negative for abdominal pain, blood in stool, constipation, diarrhea, heartburn, melena and vomiting.  Genitourinary:  Negative for dysuria, frequency and urgency.  Musculoskeletal:  Positive for back pain and joint pain.  Skin: Negative.  Negative for itching and rash.  Neurological:  Negative for tingling, focal weakness and weakness.  Endo/Heme/Allergies:  Does not bruise/bleed easily.  Psychiatric/Behavioral:  Negative for depression. The patient is not nervous/anxious and does not have insomnia.      PAST MEDICAL HISTORY :  Past Medical History:  Diagnosis Date   Breast cancer (HCC) 1999   right breast ca   Erythrocytosis 09/30/2014   Hypertension    Lipoma     PAST SURGICAL HISTORY :   Past Surgical History:  Procedure Laterality Date   MASTECTOMY Right 1999   rotator cuff      FAMILY HISTORY :   Family History  Problem Relation Age of Onset   Breast cancer Mother 37  SOCIAL HISTORY:   Social History   Tobacco Use   Smoking status: Never   Smokeless tobacco: Never  Vaping Use   Vaping status: Never Used  Substance Use Topics   Alcohol use: Yes    Alcohol/week: 1.0 standard drink of alcohol    Types: 1 Glasses of wine per week    Comment: BID prn   Drug use: Never    ALLERGIES:  is allergic to gabapentin and cephalexin.  MEDICATIONS:  Current Outpatient Medications  Medication  Sig Dispense Refill   aspirin  81 MG EC tablet Take 81 mg by mouth daily.      Calcium  Carbonate-Vitamin D  600-400 MG-UNIT per tablet Take 1 tablet by mouth 2 (two) times daily.      cloNIDine  (CATAPRES ) 0.1 MG tablet Take 1 tablet by mouth 2 (two) times daily.     Coenzyme Q10 (CO Q-10) 100 MG CAPS Take 100 mg by mouth every morning.      hydrALAZINE  (APRESOLINE ) 50 MG tablet Take 1 tablet by mouth 3 (three) times daily.     lisinopril  (ZESTRIL ) 20 MG tablet Take 20 mg by mouth daily.     MAGNESIUM GLYCINATE PLUS PO Take 200 mg by mouth every evening.      Multiple Vitamin (MULTI-VITAMINS) TABS Take 1 tablet by mouth 2 (two) times daily.      Multiple Vitamins-Minerals (MACULAR HEALTH FORMULA PO) Take 1 tablet by mouth daily.     Omega-3 Fatty Acids (FISH OIL) 1000 MG CAPS Take 1,000 mg by mouth 2 (two) times daily.      propranolol  ER (INDERAL  LA) 80 MG 24 hr capsule Take 80 mg by mouth every morning.     TURMERIC PO Take 1 tablet by mouth 2 (two) times daily. (Patient taking differently: Take 1 tablet by mouth daily.)     Alpha-Lipoic Acid 600 MG CAPS Take 600 mg by mouth daily. (Patient not taking: Reported on 03/05/2024)     No current facility-administered medications for this visit.    PHYSICAL EXAMINATION: ECOG PERFORMANCE STATUS: 0 - Asymptomatic  BP 137/73 (BP Location: Left Arm, Patient Position: Sitting, Cuff Size: Normal)   Pulse 72   Temp (!) 97.3 F (36.3 C) (Tympanic)   Resp 18   Ht 5' 2 (1.575 m)   Wt 140 lb 11.2 oz (63.8 kg)   SpO2 99%   BMI 25.73 kg/m   Filed Weights   03/05/24 1404  Weight: 140 lb 11.2 oz (63.8 kg)    Physical Exam HENT:     Head: Normocephalic and atraumatic.     Mouth/Throat:     Pharynx: No oropharyngeal exudate.  Eyes:     Pupils: Pupils are equal, round, and reactive to light.  Cardiovascular:     Rate and Rhythm: Normal rate and regular rhythm.  Pulmonary:     Effort: No respiratory distress.     Breath sounds: No wheezing.   Abdominal:     General: Bowel sounds are normal. There is no distension.     Palpations: Abdomen is soft. There is no mass.     Tenderness: There is no abdominal tenderness. There is no guarding or rebound.  Musculoskeletal:        General: No tenderness. Normal range of motion.     Cervical back: Normal range of motion and neck supple.  Skin:    General: Skin is warm.  Neurological:     Mental Status: She is alert and oriented to person, place, and time.  Psychiatric:        Mood and Affect: Affect normal.      LABORATORY DATA:  I have reviewed the data as listed    Component Value Date/Time   NA 136 03/02/2024 0550   NA 142 06/04/2014 1443   K 3.4 (L) 03/02/2024 0550   K 4.1 06/04/2014 1443   CL 103 03/02/2024 0550   CL 108 (H) 06/04/2014 1443   CO2 24 03/02/2024 0550   CO2 26 06/04/2014 1443   GLUCOSE 109 (H) 03/02/2024 0550   GLUCOSE 99 06/04/2014 1443   BUN 17 03/02/2024 0550   BUN 18 06/04/2014 1443   CREATININE 0.72 03/02/2024 0550   CREATININE 1.04 (H) 05/16/2023 1239   CREATININE 0.92 06/04/2014 1443   CALCIUM  8.9 03/02/2024 0550   CALCIUM  9.0 06/04/2014 1443   PROT 7.5 03/01/2024 1249   PROT 6.8 06/04/2014 1443   ALBUMIN 4.2 03/01/2024 1249   ALBUMIN 3.4 06/04/2014 1443   AST 21 03/01/2024 1249   AST 25 06/04/2014 1443   ALT 18 03/01/2024 1249   ALT 23 06/04/2014 1443   ALKPHOS 51 03/01/2024 1249   ALKPHOS 52 06/04/2014 1443   BILITOT 1.0 03/01/2024 1249   BILITOT 0.3 06/04/2014 1443   GFRNONAA >60 03/02/2024 0550   GFRNONAA 54 (L) 05/16/2023 1239   GFRNONAA >60 06/04/2014 1443   GFRNONAA 51 (L) 12/11/2013 1412   GFRAA >60 01/06/2020 1253   GFRAA >60 06/04/2014 1443   GFRAA 59 (L) 12/11/2013 1412    No results found for: SPEP, UPEP  Lab Results  Component Value Date   WBC 7.1 03/02/2024   NEUTROABS 7.7 03/01/2024   HGB 15.6 (H) 03/02/2024   HCT 47.0 (H) 03/02/2024   MCV 89.0 03/02/2024   PLT 201 03/02/2024      Chemistry       Component Value Date/Time   NA 136 03/02/2024 0550   NA 142 06/04/2014 1443   K 3.4 (L) 03/02/2024 0550   K 4.1 06/04/2014 1443   CL 103 03/02/2024 0550   CL 108 (H) 06/04/2014 1443   CO2 24 03/02/2024 0550   CO2 26 06/04/2014 1443   BUN 17 03/02/2024 0550   BUN 18 06/04/2014 1443   CREATININE 0.72 03/02/2024 0550   CREATININE 1.04 (H) 05/16/2023 1239   CREATININE 0.92 06/04/2014 1443      Component Value Date/Time   CALCIUM  8.9 03/02/2024 0550   CALCIUM  9.0 06/04/2014 1443   ALKPHOS 51 03/01/2024 1249   ALKPHOS 52 06/04/2014 1443   AST 21 03/01/2024 1249   AST 25 06/04/2014 1443   ALT 18 03/01/2024 1249   ALT 23 06/04/2014 1443   BILITOT 1.0 03/01/2024 1249   BILITOT 0.3 06/04/2014 1443        ASSESSMENT & PLAN:    Erythrocytosis # 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    # However given stability of blood pressures the difficulty of IV access- I think it reasonable to stretch the goal to  < 50]; and then every 3 months;  Proceed with phlebotomy- as needed- f/u in Jan for labs and possible phlebotomy as well as to discuss with MD the goal to be back to < 47   # Elevated blood pressure- ~152/77 today; Instructions from hospital MD to take extra clonidine  if blood pressure systolic remains over 160 unstable recent ED visit-; defer to Dr. Auston she  has appointment with him this week.   # IV access: Poor IV access.  Pt declines; Hold off a port at this time.  If worse port could be considered.  No problem-specific Assessment & Plan notes found for this encounter.  D/C November appt. Lab/ See MD/ +/- phlebotomy end of January mb    Morna Husband, NP 03/05/2024 4:11 PM

## 2024-03-05 NOTE — Progress Notes (Signed)
 Pt has questions about her blood work/phlebotomy. Was seen at [facility] 03/02/24. She has a sheet for you to review about her questions.   She is concerned with her iron being low and how to bring it up with foods?

## 2024-03-12 ENCOUNTER — Encounter: Payer: Self-pay | Admitting: Internal Medicine

## 2024-03-21 ENCOUNTER — Inpatient Hospital Stay: Admitting: Internal Medicine

## 2024-03-22 ENCOUNTER — Other Ambulatory Visit

## 2024-03-22 ENCOUNTER — Encounter

## 2024-05-07 ENCOUNTER — Ambulatory Visit: Admitting: Internal Medicine

## 2024-05-07 ENCOUNTER — Other Ambulatory Visit

## 2024-05-07 ENCOUNTER — Encounter

## 2024-05-08 ENCOUNTER — Encounter: Payer: Self-pay | Admitting: Internal Medicine

## 2024-05-10 ENCOUNTER — Inpatient Hospital Stay: Attending: Internal Medicine

## 2024-05-10 ENCOUNTER — Inpatient Hospital Stay

## 2024-05-10 ENCOUNTER — Telehealth: Payer: Self-pay | Admitting: Internal Medicine

## 2024-05-10 NOTE — Patient Instructions (Signed)

## 2024-05-10 NOTE — Telephone Encounter (Signed)
 Patient stopped by and requested her 1/27 appointment be cancelled. She states she thinks it needs to be moved out 2 months.

## 2024-05-10 NOTE — Progress Notes (Signed)
 Michele Andrade presents today for phlebotomy per MD orders. Phlebotomy procedure started at 1115 and ended at 1140. 300 mls removed. Patient tolerated procedure well. IV needle removed intact.

## 2024-05-13 ENCOUNTER — Inpatient Hospital Stay

## 2024-06-04 ENCOUNTER — Inpatient Hospital Stay

## 2024-06-04 ENCOUNTER — Inpatient Hospital Stay: Admitting: Internal Medicine

## 2024-07-08 ENCOUNTER — Inpatient Hospital Stay

## 2024-07-08 ENCOUNTER — Inpatient Hospital Stay: Admitting: Internal Medicine
